# Patient Record
Sex: Female | Born: 1945 | Race: White | Hispanic: No | Marital: Married | Smoking: Never smoker
Health system: Southern US, Community
[De-identification: ages and names within clinical notes are randomized; demographics above are authoritative.]

## PROBLEM LIST (undated history)

## (undated) DIAGNOSIS — I639 Cerebral infarction, unspecified: Secondary | ICD-10-CM

## (undated) DIAGNOSIS — R42 Dizziness and giddiness: Secondary | ICD-10-CM

## (undated) DIAGNOSIS — G4730 Sleep apnea, unspecified: Secondary | ICD-10-CM

## (undated) DIAGNOSIS — R569 Unspecified convulsions: Secondary | ICD-10-CM

## (undated) DIAGNOSIS — E785 Hyperlipidemia, unspecified: Secondary | ICD-10-CM

## (undated) HISTORY — PX: CHOLECYSTECTOMY: SHX55

## (undated) HISTORY — DX: Cerebral infarction, unspecified: I63.9

## (undated) HISTORY — DX: Sleep apnea, unspecified: G47.30

## (undated) HISTORY — DX: Hyperlipidemia, unspecified: E78.5

## (undated) HISTORY — DX: Unspecified convulsions: R56.9

## (undated) HISTORY — DX: Dizziness and giddiness: R42

## (undated) HISTORY — PX: KNEE ARTHROSCOPY: SUR90

---

## 1968-05-02 DIAGNOSIS — I639 Cerebral infarction, unspecified: Secondary | ICD-10-CM

## 1968-05-02 HISTORY — DX: Cerebral infarction, unspecified: I63.9

## 2006-10-06 ENCOUNTER — Observation Stay (HOSPITAL_COMMUNITY): Admission: AD | Admit: 2006-10-06 | Discharge: 2006-10-07 | Payer: Self-pay | Admitting: Specialist

## 2006-10-06 ENCOUNTER — Other Ambulatory Visit: Payer: Self-pay | Admitting: Specialist

## 2006-10-17 ENCOUNTER — Encounter: Admission: RE | Admit: 2006-10-17 | Discharge: 2006-11-09 | Payer: Self-pay | Admitting: Specialist

## 2006-11-15 ENCOUNTER — Emergency Department (HOSPITAL_COMMUNITY): Admission: EM | Admit: 2006-11-15 | Discharge: 2006-11-15 | Payer: Self-pay | Admitting: Emergency Medicine

## 2008-12-10 ENCOUNTER — Ambulatory Visit (HOSPITAL_COMMUNITY): Admission: RE | Admit: 2008-12-10 | Discharge: 2008-12-10 | Payer: Self-pay | Admitting: Neurology

## 2009-01-07 ENCOUNTER — Ambulatory Visit: Payer: Self-pay | Admitting: Cardiology

## 2009-01-07 DIAGNOSIS — I498 Other specified cardiac arrhythmias: Secondary | ICD-10-CM

## 2009-01-07 DIAGNOSIS — R55 Syncope and collapse: Secondary | ICD-10-CM | POA: Insufficient documentation

## 2009-01-07 DIAGNOSIS — E669 Obesity, unspecified: Secondary | ICD-10-CM

## 2009-05-13 ENCOUNTER — Encounter (INDEPENDENT_AMBULATORY_CARE_PROVIDER_SITE_OTHER): Payer: Self-pay | Admitting: *Deleted

## 2010-06-01 NOTE — Letter (Signed)
Summary: Previsit letter  [facility]  [address] [place], [place] [address]   Phone: [phone]  Fax: [phone]       05/13/2009 MRN: [id number]  Tracie [name] [address] [place], [place]  [address]  Dear [name],  Welcome to the Gastroenterology Division at [facility].    You are scheduled to see a nurse for your pre-procedure visit on 06-05-09 at 9AM on the 3rd floor at [facility], [address]  We ask that you try to arrive at our office 15 minutes prior to your appointment time to allow for check-in.  Your nurse visit will consist of discussing your medical and surgical history, your immediate family medical history, and your medications.    Please bring a complete list of all your medications or, if you prefer, bring the medication bottles and we will list them.  We will need to be aware of both prescribed and over the counter drugs.  We will need to know exact dosage information as well.  If you are on blood thinners (Coumadin, Plavix, Aggrenox, Ticlid, etc.) please call our office today/prior to your appointment, as we need to consult with your physician about holding your medication.   Please be prepared to read and sign documents such as consent forms, a financial agreement, and acknowledgement forms.  If necessary, and with your consent, a friend or relative is welcome to sit-in on the nurse visit with you.  Please bring your insurance card so that we may make a copy of it.  If your insurance requires a referral to see a specialist, please bring your referral form from your primary care physician.  No co-pay is required for this nurse visit.     If you cannot keep your appointment, please call [phone] to cancel or reschedule prior to your appointment date.  This allows [place] the opportunity to schedule an appointment for another patient in need of care.    Thank you for choosing  Gastroenterology for your medical needs.  We appreciate  the opportunity to care for you.  Please visit [place] at our website  to learn more about our practice.                     Sincerely.                                                                                                                   The Gastroenterology Division

## 2010-09-14 NOTE — Op Note (Signed)
NAME:  Tracie [name], Tracie [name]                 ACCOUNT NO.:  [id number]   MEDICAL RECORD NO.:  [id number]          PATIENT TYPE:  AMB   LOCATION:  [facility]                         FACILITY:  [facility]   PHYSICIAN:  Erasmo Leventhal, M.D.DATE OF BIRTH:  09-13-1945   DATE OF PROCEDURE:  DATE OF DISCHARGE:                               OPERATIVE REPORT   PREOPERATIVE DIAGNOSES:  1. Right knee torn medial meniscus.  2. Early osteoarthritis.   POSTOPERATIVE DIAGNOSES:  1. Right knee torn medial meniscus.  2. Early osteoarthritis.   PROCEDURE:  1. Right knee arthroscopic partial medial meniscectomy.  2. Chondroplasty medial femoral condyle.  3. 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   SURGEON:  Valma Cava, M.D.   ASSISTANT:  Jodene Nam, P.A.C.   ANESTHESIA:  Local block, MAC.   ESTIMATED BLOOD LOSS:  Less than 10 mL.   DRAINS:  None.   COMPLICATIONS:  None.   TOURNIQUET TIME:  None.   DISPOSITION:  PACU, stable.   DESCRIPTION OF PROCEDURE:  The patient was counseled in the holding  area.  Correct side was identified.  IV started, antibiotics given,  block administered.  Taken to the operating room, placed in position,  MAC anesthesia, placed in thigh holder, prepped and draped in a sterile  fashion.  A standard three portal arthroscopy was performed in the  proximal medial, anteromedial and anterolateral portal.  Diagnostic  arthroscopy revealed the following findings:  Grade 3 chondromalacia  patella showed unstable.  Mechanical chondroplasty performed back to a  stable base.  Patellofemoral tracking was normal.  The ACL and PCL were  intact.  Lateral compartment inspected with normal cartilage on the  lateral meniscus.  __________ was inspected.  Grade 2 chondromalacia  medial femoral condyle __________ , radial tear posterior horn medial  meniscus.  Utilizing basket motorized shaver, a punch medial  meniscectomy performed back to a  stable base __________ contoured.  The  knee was then sequentially reinspected, no other abnormalities noted.  __________ suture.  __________ Marcaine 1.25%, 20 mL, with  epinephrine and 4 mg of morphine sulfate were injected into the knee at  the end of the case.  A sterile dressing applied to the knee, TED hose,  ice pack.  No complications.  Awakened, taken from the operating room to  the PACU in stable condition.  No complications or problems.  Patient is  doing well __________ dictation.           ______________________________  Erasmo Leventhal, M.D.     RAC/MEDQ  D:  10/06/2006  T:  10/06/2006  Job:  [id number]

## 2010-09-14 NOTE — Procedures (Signed)
REFERRING PHYSICIAN:  Rene Kocher, MD   CLINICAL HISTORY:  A 65 year old patient being evaluated for episode of  loss of consciousness.   Medications listed of carbamazepine and Zocor.   This is a 17-channel EEG recorded with the patient awake using standard  10/20 electrode placement.   Background awake rhythm consists of 7-8 Hz alpha which is of moderate  amplitude and synchronous reactive to eye-opening and closure.  The 6-7  Hz theta slowing is seen bilaterally in a diffuse and symmetric  distribution.  No paroxysmal epileptiform activity spikes or sharp waves  are seen.  Hyperventilation and photic stimulation are both  unremarkable.  Length of the recording is 22.2 minutes.  Technical  component is average with significant muscle artifacts towards the end  of the recording.  EKG tracing reveals regular sinus rhythm.   IMPRESSION:  This electroencephalogram performed during awake states is  abnormal due to presence of mild bihemispheric slowing is a nonspecific  finding seen in a variety of conditions.  No definite epileptiform  features were noted.           ______________________________  Sunny Schlein. Pearlean Brownie, MD     YNW:GNFA  D:  12/10/2008 14:52:29  T:  12/11/2008 01:21:39  Job #:  [id number]   cc:   Rene Kocher, M.D.  Fax: [phone]

## 2010-12-09 ENCOUNTER — Encounter: Payer: Self-pay | Admitting: Cardiology

## 2011-02-17 LAB — POCT I-STAT 4, (NA,K, GLUC, HGB,HCT)
HCT: 37
Hemoglobin: 12.6
Potassium: 3.8

## 2012-07-10 ENCOUNTER — Other Ambulatory Visit: Payer: Self-pay | Admitting: *Deleted

## 2012-07-17 ENCOUNTER — Telehealth: Payer: Self-pay | Admitting: Physician Assistant

## 2012-07-17 NOTE — Telephone Encounter (Signed)
Needs referral to [facility] to have bladder fixed, states that it has fallen

## 2012-07-17 NOTE — Telephone Encounter (Signed)
Needs referral to surgeon for bladder

## 2012-07-18 ENCOUNTER — Other Ambulatory Visit: Payer: Self-pay | Admitting: *Deleted

## 2012-07-18 DIAGNOSIS — R32 Unspecified urinary incontinence: Secondary | ICD-10-CM

## 2012-07-31 ENCOUNTER — Telehealth: Payer: Self-pay | Admitting: Physician Assistant

## 2012-08-01 ENCOUNTER — Telehealth: Payer: Self-pay | Admitting: Pharmacist

## 2012-08-01 ENCOUNTER — Ambulatory Visit (INDEPENDENT_AMBULATORY_CARE_PROVIDER_SITE_OTHER): Payer: 59

## 2012-08-01 ENCOUNTER — Ambulatory Visit (INDEPENDENT_AMBULATORY_CARE_PROVIDER_SITE_OTHER): Payer: 59 | Admitting: Pharmacist

## 2012-08-01 ENCOUNTER — Other Ambulatory Visit: Payer: Self-pay

## 2012-08-01 DIAGNOSIS — M899 Disorder of bone, unspecified: Secondary | ICD-10-CM

## 2012-08-01 DIAGNOSIS — Z79899 Other long term (current) drug therapy: Secondary | ICD-10-CM

## 2012-08-01 DIAGNOSIS — M949 Disorder of cartilage, unspecified: Secondary | ICD-10-CM

## 2012-08-01 DIAGNOSIS — M8580 Other specified disorders of bone density and structure, unspecified site: Secondary | ICD-10-CM

## 2012-08-01 DIAGNOSIS — E559 Vitamin D deficiency, unspecified: Secondary | ICD-10-CM

## 2012-08-01 MED ORDER — RALOXIFENE HCL 60 MG PO TABS: 60.0000 mg | ORAL_TABLET | Freq: Every day | ORAL | Status: DC

## 2012-08-01 NOTE — Progress Notes (Signed)
Subjective:     Patient ID: Tracie [name], female   DOB: 1945/09/30, 67 y.o.   MRN: [id number] [facility] Current Height:    63.5"    Max Lifetime Height:  66" Current Weight:     219lbs    Ethnicity: Not Hispanic White  HPI: Does pt already have a diagnosis of:  Osteopenia?  Yes Osteoporosis?  No  Back Pain?  No       Kyphosis?  No Prior fracture?  No Med(s) for Osteoporosis/Osteopenia:  none Med(s) previously tried for Osteoporosis/Osteopenia:  Fosamax 70mg  q week - took for less than 1 year but pt cannot recall why stopped and cannot locate reason for discontinuation in chart.                                                             PMH: Age at menopause:  67yo Hysterectomy?  No Oophorectomy?  No HRT? No Steroid Use?  No Thyroid med?  No History of cancer?  No History of digestive disorders (ie Crohn's)?  No Current or previous eating disorders?  No Last Vitamin D Result:  23 (08/2011) Last GFR Result:  65 (08/2011)     HPI   Review of Systems   FH/SH: Family history of osteoporosis?  No Parent with history of hip fracture?  No Family history of breast cancer?  Yes - mother, 3 sisters, cousin, maternal GM Exercise?  No Caffeine?  Yes - has 1 cup coffe daily, 1 glass of tea daily Smoking?  No Alcohol?  No    Calcium Assessment Calcium Intake  # of servings/day  Calcium mg  Milk (8 oz) 0.5  x  300  = 150  Yogurt (8 oz) 0.5 x  400 = 200  Cheese (1 oz) 0.5 x  200 = 100  Non dairy sources   250 mg  Ca supplement 0 = 0   Estimated calcium intake per day 700mg             Objective:   Physical Exam DEXA Results Date of Test T-Score for AP Spine L1-L4 T-Score for Total Left  Hip T-Score for Total Right Hip  08/01/2012 -1.8 -0.7 -0.1  05/07/2008 -2.4 -0.8 -0.3                 Assessment:   FRAX 10 year estimate: Total FX risk:  21%  (consider medication if >/= 20%) Hip FX risk:  2.2%  (consider medication if >/=  3%)  Assessment: Osteopenia high estimated fracture risk Vitamin D deficiency     Plan:    Recommendations: Start Evista 60mg  qd - gave #56 samples and called in rx to be profiled until needed Increase daily calcium intake through either supplementation or diet - handout given. Weight bearing exercise as able Educate on fall preventtion - counseling and educational materials provided Recheck vitamin D level today since last check was 1 year ago and pt was found to be vitamin D deficient.  Recheck DEXA:  2 years  Time spent counseling patient:  30 minutes

## 2012-08-01 NOTE — Telephone Encounter (Signed)
Started Tracie [name] on Evista this am - husband wanted to discuss possible side effects and how to take medication.   [name] repeated that he understood information and will call if any further questions.

## 2012-08-02 LAB — VITAMIN D 25 HYDROXY (VIT D DEFICIENCY, FRACTURES): Vit D, 25-Hydroxy: 23 ng/mL — ABNORMAL LOW (ref 30–89)

## 2012-08-09 ENCOUNTER — Encounter: Payer: Self-pay | Admitting: Family Medicine

## 2012-09-14 NOTE — Telephone Encounter (Signed)
error 

## 2012-12-10 ENCOUNTER — Other Ambulatory Visit (INDEPENDENT_AMBULATORY_CARE_PROVIDER_SITE_OTHER): Payer: Medicare Other

## 2012-12-10 DIAGNOSIS — Z79899 Other long term (current) drug therapy: Secondary | ICD-10-CM

## 2012-12-10 DIAGNOSIS — G40309 Generalized idiopathic epilepsy and epileptic syndromes, not intractable, without status epilepticus: Secondary | ICD-10-CM

## 2012-12-10 NOTE — Progress Notes (Signed)
PATIENT HERE TODAY FOR LABS ORDERED FROM DR LEWIT.

## 2012-12-11 LAB — CBC WITH DIFFERENTIAL/PLATELET
Basophils Absolute: 0 10*3/uL (ref 0.0–0.2)
Eosinophils Absolute: 0.1 10*3/uL (ref 0.0–0.4)
Hemoglobin: 13.7 g/dL (ref 11.1–15.9)
Immature Grans (Abs): 0 10*3/uL (ref 0.0–0.1)
Immature Granulocytes: 0 % (ref 0–2)
Lymphs: 42 % (ref 14–46)
MCH: 29.8 pg (ref 26.6–33.0)
MCHC: 34.1 g/dL (ref 31.5–35.7)
Monocytes: 8 % (ref 4–12)
Neutrophils Absolute: 2.3 10*3/uL (ref 1.4–7.0)
Neutrophils Relative %: 49 % (ref 40–74)
RDW: 13.8 % (ref 12.3–15.4)

## 2012-12-11 LAB — AST: AST: 15 IU/L (ref 0–40)

## 2012-12-11 LAB — ALT: ALT: 12 IU/L (ref 0–32)

## 2013-02-27 ENCOUNTER — Telehealth: Payer: Self-pay | Admitting: Family Medicine

## 2013-02-27 NOTE — Telephone Encounter (Signed)
Offered appt for today but couldn't come. Gave appt for tomorrow

## 2013-02-28 ENCOUNTER — Ambulatory Visit: Payer: Medicare Other | Admitting: Family Medicine

## 2013-10-27 ENCOUNTER — Emergency Department (HOSPITAL_COMMUNITY): Payer: Medicare HMO

## 2013-10-27 ENCOUNTER — Emergency Department (HOSPITAL_COMMUNITY)
Admission: EM | Admit: 2013-10-27 | Discharge: 2013-10-27 | Disposition: A | Discharge status: Home / Self Care | Payer: Medicare HMO | Attending: Emergency Medicine | Admitting: Emergency Medicine

## 2013-10-27 ENCOUNTER — Encounter (HOSPITAL_COMMUNITY): Payer: Self-pay | Admitting: Emergency Medicine

## 2013-10-27 DIAGNOSIS — Z8669 Personal history of other diseases of the nervous system and sense organs: Secondary | ICD-10-CM | POA: Insufficient documentation

## 2013-10-27 DIAGNOSIS — R062 Wheezing: Secondary | ICD-10-CM | POA: Insufficient documentation

## 2013-10-27 DIAGNOSIS — Z862 Personal history of diseases of the blood and blood-forming organs and certain disorders involving the immune mechanism: Secondary | ICD-10-CM | POA: Insufficient documentation

## 2013-10-27 DIAGNOSIS — N10 Acute tubulo-interstitial nephritis: Secondary | ICD-10-CM | POA: Insufficient documentation

## 2013-10-27 DIAGNOSIS — R0602 Shortness of breath: Secondary | ICD-10-CM | POA: Insufficient documentation

## 2013-10-27 DIAGNOSIS — G4730 Sleep apnea, unspecified: Secondary | ICD-10-CM | POA: Insufficient documentation

## 2013-10-27 DIAGNOSIS — E785 Hyperlipidemia, unspecified: Secondary | ICD-10-CM | POA: Insufficient documentation

## 2013-10-27 DIAGNOSIS — Z8673 Personal history of transient ischemic attack (TIA), and cerebral infarction without residual deficits: Secondary | ICD-10-CM | POA: Insufficient documentation

## 2013-10-27 DIAGNOSIS — M25559 Pain in unspecified hip: Secondary | ICD-10-CM | POA: Insufficient documentation

## 2013-10-27 DIAGNOSIS — Z9981 Dependence on supplemental oxygen: Secondary | ICD-10-CM | POA: Insufficient documentation

## 2013-10-27 DIAGNOSIS — Z79899 Other long term (current) drug therapy: Secondary | ICD-10-CM | POA: Insufficient documentation

## 2013-10-27 DIAGNOSIS — Z792 Long term (current) use of antibiotics: Secondary | ICD-10-CM | POA: Insufficient documentation

## 2013-10-27 DIAGNOSIS — Z8639 Personal history of other endocrine, nutritional and metabolic disease: Secondary | ICD-10-CM | POA: Insufficient documentation

## 2013-10-27 LAB — CBC WITH DIFFERENTIAL/PLATELET
Basophils Absolute: 0 10*3/uL (ref 0.0–0.1)
Basophils Relative: 1 % (ref 0–1)
EOS PCT: 0 % (ref 0–5)
Eosinophils Absolute: 0 10*3/uL (ref 0.0–0.7)
HEMATOCRIT: 40.2 % (ref 36.0–46.0)
HEMOGLOBIN: 14.3 g/dL (ref 12.0–15.0)
LYMPHS ABS: 0.8 10*3/uL (ref 0.7–4.0)
LYMPHS PCT: 13 % (ref 12–46)
MCH: 29.9 pg (ref 26.0–34.0)
MCHC: 35.6 g/dL (ref 30.0–36.0)
MCV: 84.1 fL (ref 78.0–100.0)
MONO ABS: 0.2 10*3/uL (ref 0.1–1.0)
Monocytes Relative: 4 % (ref 3–12)
Neutro Abs: 4.9 10*3/uL (ref 1.7–7.7)
Neutrophils Relative %: 83 % — ABNORMAL HIGH (ref 43–77)
Platelets: 45 10*3/uL — ABNORMAL LOW (ref 150–400)
RBC: 4.78 MIL/uL (ref 3.87–5.11)
RDW: 13.6 % (ref 11.5–15.5)
WBC: 5.9 10*3/uL (ref 4.0–10.5)

## 2013-10-27 LAB — BASIC METABOLIC PANEL
BUN: 33 mg/dL — AB (ref 6–23)
CHLORIDE: 91 meq/L — AB (ref 96–112)
CO2: 20 meq/L (ref 19–32)
CREATININE: 1.77 mg/dL — AB (ref 0.50–1.10)
Calcium: 8.4 mg/dL (ref 8.4–10.5)
GFR calc Af Amer: 33 mL/min — ABNORMAL LOW (ref >90)
GFR calc non Af Amer: 29 mL/min — ABNORMAL LOW (ref >90)
Glucose, Bld: 155 mg/dL — ABNORMAL HIGH (ref 70–99)
Potassium: 3.5 mEq/L — ABNORMAL LOW (ref 3.7–5.3)
Sodium: 130 mEq/L — ABNORMAL LOW (ref 137–147)

## 2013-10-27 LAB — URINALYSIS, ROUTINE W REFLEX MICROSCOPIC
GLUCOSE, UA: NEGATIVE mg/dL
KETONES UR: NEGATIVE mg/dL
Nitrite: NEGATIVE
PH: 5.5 (ref 5.0–8.0)
Protein, ur: 30 mg/dL — AB
Specific Gravity, Urine: 1.02 (ref 1.005–1.030)
Urobilinogen, UA: 1 mg/dL (ref 0.0–1.0)

## 2013-10-27 LAB — URINE MICROSCOPIC-ADD ON

## 2013-10-27 LAB — I-STAT CG4 LACTIC ACID, ED: LACTIC ACID, VENOUS: 3.49 mmol/L — AB (ref 0.5–2.2)

## 2013-10-27 MED ORDER — SODIUM CHLORIDE 0.9 % IV SOLN
Freq: Once | INTRAVENOUS | Status: AC
  Administered 2013-10-27: 08:00:00 via INTRAVENOUS

## 2013-10-27 MED ORDER — ACETAMINOPHEN 325 MG PO TABS
650.0000 mg | ORAL_TABLET | Freq: Once | ORAL | Status: AC
  Administered 2013-10-27: 650 mg via ORAL
  Filled 2013-10-27: qty 2

## 2013-10-27 MED ORDER — ALBUTEROL SULFATE (2.5 MG/3ML) 0.083% IN NEBU
2.5000 mg | INHALATION_SOLUTION | RESPIRATORY_TRACT | Status: DC | PRN
  Administered 2013-10-27: 2.5 mg via RESPIRATORY_TRACT
  Filled 2013-10-27: qty 3

## 2013-10-27 MED ORDER — SODIUM CHLORIDE 0.9 % IV BOLUS (SEPSIS)
1000.0000 mL | Freq: Once | INTRAVENOUS | Status: AC
  Administered 2013-10-27: 1000 mL via INTRAVENOUS

## 2013-10-27 MED ORDER — ONDANSETRON HCL 4 MG/2ML IJ SOLN
4.0000 mg | Freq: Once | INTRAMUSCULAR | Status: AC
  Administered 2013-10-27: 4 mg via INTRAVENOUS
  Filled 2013-10-27: qty 2

## 2013-10-27 MED ORDER — DEXTROSE 5 % IV SOLN
1.0000 g | Freq: Once | INTRAVENOUS | Status: AC
  Administered 2013-10-27: 1 g via INTRAVENOUS
  Filled 2013-10-27: qty 10

## 2013-10-27 MED ORDER — LEVOFLOXACIN 500 MG PO TABS: 500.0000 mg | ORAL_TABLET | Freq: Every day | ORAL | Status: AC

## 2013-10-27 NOTE — ED Notes (Signed)
Pt given cup of water 

## 2013-10-27 NOTE — ED Provider Notes (Signed)
CSN: [id number]     Arrival date & time 10/27/13  0715 History  This chart was scribed for Tracie Porter, MD by Ardelia Mems, ED Scribe. This patient was seen in room [facility]18[facility]18 and the patient's care was started at 7:35 AM.   Chief Complaint  Patient presents with  . Fever   The history is provided by the patient. No language interpreter was used.    HPI Comments: Tracie [name] is a 68 y.o. female who presents to the Emergency Department complaining of a cough, productive of yellow sputum, with associated SOB over the past 6 days. She reports an associated fever with aTmax of 101 F. ED temperature is 102 F. She reports that she also had nausea and episodes of emesis 2 and 3 days ago. She also reports that over the past few days, she has been having bilateral hip pain that is present when she sits up. She states that she saw her PCP for these symptoms 4 days ago and she was diagnosed with bronchitis. She states that she did not have a CXR. She states that she was started on Sulfa and a cough suppressant, which have not been offering significant relief. She denies back pain. She denies any history of cardiac or renal disease.    Past Medical History  Diagnosis Date  . Sleep apnea     somewhat compliant with CPAP  . CVA (cerebral infarction) 1970  . Seizures   . Gout   . Hyperlipidemia   . Vertigo    Past Surgical History  Procedure Laterality Date  . Cholecystectomy    . Knee arthroscopy      Right   Family History  Problem Relation Age of Onset  . Heart attack Father    History  Substance Use Topics  . Smoking status: Never Smoker   . Smokeless tobacco: Not on file  . Alcohol Use: No   OB History   Grav Para Term Preterm Abortions TAB SAB Ect Mult Living                 Review of Systems  Constitutional: Positive for fever. Negative for chills, diaphoresis, appetite change and fatigue.  HENT: Negative for mouth sores, sore throat and trouble swallowing.   Eyes: Negative  for visual disturbance.  Respiratory: Positive for cough and shortness of breath. Negative for chest tightness and wheezing.   Cardiovascular: Negative for chest pain.  Gastrointestinal: Positive for nausea and vomiting (resolved). Negative for abdominal pain, diarrhea and abdominal distention.  Endocrine: Negative for polydipsia, polyphagia and polyuria.  Genitourinary: Negative for dysuria, frequency and hematuria.  Musculoskeletal: Positive for arthralgias (bilateral hips). Negative for back pain and gait problem.  Skin: Negative for color change, pallor and rash.  Neurological: Negative for dizziness, syncope, light-headedness and headaches.  Hematological: Does not bruise/bleed easily.  Psychiatric/Behavioral: Negative for behavioral problems and confusion.    Allergies  Review of patient's allergies indicates no known allergies.  Home Medications   Prior to Admission medications   Medication Sig Start Date End Date Taking? Authorizing Laurance Heide  acetaminophen (TYLENOL) 500 MG tablet Take 500-1,000 mg by mouth 2 (two) times daily.   Yes Historical Valeri Sula, MD  albuterol (PROVENTIL HFA;VENTOLIN HFA) 108 (90 BASE) MCG/ACT inhaler Inhale 2 puffs into the lungs every 6 (six) hours as needed for wheezing or shortness of breath.   Yes Historical Joie Hipps, MD  HYDROcodone-homatropine (HYCODAN) 5-1.5 MG/5ML syrup Take 5 mLs by mouth at bedtime.   Yes Historical Jeston Junkins,  MD  lamoTRIgine (LAMICTAL) 100 MG tablet Take 100 mg by mouth 3 (three) times daily.  08/01/09  Yes Historical Lyn Deemer, MD  promethazine (PHENERGAN) 25 MG tablet Take 25 mg by mouth every 6 (six) hours as needed for nausea or vomiting.   Yes Historical Jeanne Diefendorf, MD  sulfamethoxazole-trimethoprim (BACTRIM DS) 800-160 MG per tablet Take 1 tablet by mouth 2 (two) times daily.   Yes Historical Loghan Kurtzman, MD  Vitamin D, Ergocalciferol, (DRISDOL) 50000 UNITS CAPS capsule Take 50,000 Units by mouth every 7 (seven) days. On Saturday.    Yes Historical Audrionna Lampton, MD  levofloxacin (LEVAQUIN) 500 MG tablet Take 1 tablet (500 mg total) by mouth daily. 10/27/13   Tracie PorterMark James, MD   Triage Vitals: BP 114/72  Pulse 97  Temp(Src) 102 F (38.9 C) (Oral)  Resp 24  Ht 5\' 6"  (1.676 m)  Wt 220 lb (99.791 kg)  BMI 35.53 kg/m2  SpO2 93%  Physical Exam  Vitals reviewed. Constitutional: She is oriented to person, place, and time. She appears well-developed and well-nourished. No distress.  3 to 4 word dyspnea  HENT:  Head: Normocephalic.  Erythematous tonsils with some white exudate. Mucous membranes are dry.  Eyes: Conjunctivae are normal. Pupils are equal, round, and reactive to light. No scleral icterus.  Neck: Normal range of motion. Neck supple. No thyromegaly present.  Cardiovascular: Normal rate and regular rhythm.  Exam reveals no gallop and no friction rub.   No murmur heard. Pulmonary/Chest: Effort normal. No respiratory distress. She has wheezes. She has no rales.  Globally diminished breath sounds. Wheezing at the right base posteriorly.  Abdominal: Soft. Bowel sounds are normal. She exhibits no distension. There is no tenderness. There is no rebound.  Musculoskeletal: Normal range of motion.  Neurological: She is alert and oriented to person, place, and time.  Skin: Skin is warm and dry. No rash noted.  Psychiatric: She has a normal mood and affect. Her behavior is normal.    ED Course  Procedures (including critical care time)  DIAGNOSTIC STUDIES: Oxygen Saturation is 93% on RA, adequate by my interpretation.    COORDINATION OF CARE: 7:41 AM- Will order a CXR and diagnostic lab work. Will also order a breathing treatment, Tylenol, Zofran and IV fluids. Pt advised of plan for treatment and pt agrees.  Labs Review Labs Reviewed  CBC WITH DIFFERENTIAL - Abnormal; Notable for the following:    Platelets 45 (*)    Neutrophils Relative % 83 (*)    All other components within normal limits  BASIC METABOLIC PANEL -  Abnormal; Notable for the following:    Sodium 130 (*)    Potassium 3.5 (*)    Chloride 91 (*)    Glucose, Bld 155 (*)    BUN 33 (*)    Creatinine, Ser 1.77 (*)    GFR calc non Af Amer 29 (*)    GFR calc Af Amer 33 (*)    All other components within normal limits  URINALYSIS, ROUTINE W REFLEX MICROSCOPIC - Abnormal; Notable for the following:    Color, Urine AMBER (*)    APPearance CLOUDY (*)    Hgb urine dipstick TRACE (*)    Bilirubin Urine SMALL (*)    Protein, ur 30 (*)    Leukocytes, UA LARGE (*)    All other components within normal limits  URINE MICROSCOPIC-ADD ON - Abnormal; Notable for the following:    Squamous Epithelial / LPF MANY (*)    Bacteria, UA MANY (*)  All other components within normal limits  I-STAT CG4 LACTIC ACID, ED - Abnormal; Notable for the following:    Lactic Acid, Venous 3.49 (*)    All other components within normal limits  URINE CULTURE    Imaging Review Dg Chest 2 View  10/27/2013   CLINICAL DATA:  Fever  EXAM: CHEST  2 VIEW  COMPARISON:  10/19/2013  FINDINGS: Cardiomediastinal silhouette is stable. No acute infiltrate or pleural effusion. No pulmonary edema. Mild infrahilar bronchitic changes without focal consolidation. Degenerative changes thoracic spine.  IMPRESSION: No acute infiltrate or pulmonary edema. Mild infrahilar bronchitic changes. Degenerative changes thoracic spine.   Electronically Signed   By: Natasha MeadLiviu  Pop M.D.   On: 10/27/2013 10:17   Dg Chest Port 1 View  10/27/2013   CLINICAL DATA:  Fever  EXAM: PORTABLE CHEST - 1 VIEW  COMPARISON:  None.  FINDINGS: Apical lordotic positioning. Patient rotated left. Normal heart size for level of inspiration. No right and no definite left pleural effusion. No pneumothorax. Low lung volumes with resultant pulmonary interstitial prominence. Right lung is clear. The left lung base is poorly evaluated secondary to overlying soft tissues.  IMPRESSION: Suboptimal evaluation of the left lung base and  pleural space secondary to overlying soft tissues. Given this factor, no explanation for pneumonia identified. PA and lateral radiographs may be helpful.   Electronically Signed   By: Jeronimo GreavesKyle  Talbot M.D.   On: 10/27/2013 09:18     EKG Interpretation None      MDM   Final diagnoses:  Acute pyelonephritis    Patient given IV fluids. Feeling better. Antiemetics, taking by mouth. Given IV Rocephin for UTI. Offered admission. She declines because my Grand  kids come to see me at home". Encouraged return if not doing well at home. Encourage by mouth fluids.  I personally performed the services described in this documentation, which was scribed in my presence. The recorded information has been reviewed and is accurate.   Tracie PorterMark James, MD 10/27/13 1328

## 2013-10-27 NOTE — Discharge Instructions (Signed)
Pyelonephritis, Adult °Pyelonephritis is a kidney infection. In general, there are 2 main types of pyelonephritis: °· Infections that come on quickly without any warning (acute pyelonephritis). °· Infections that persist for a long period of time (chronic pyelonephritis). °CAUSES  °Two main causes of pyelonephritis are: °· Bacteria traveling from the bladder to the kidney. This is a problem especially in pregnant women. The urine in the bladder can become filled with bacteria from multiple causes, including: °¨ Inflammation of the prostate gland (prostatitis). °¨ Sexual intercourse in females. °¨ Bladder infection (cystitis). °· Bacteria traveling from the bloodstream to the tissue part of the kidney. °Problems that may increase your risk of getting a kidney infection include: °· Diabetes. °· Kidney stones or bladder stones. °· Cancer. °· Catheters placed in the bladder. °· Other abnormalities of the kidney or ureter. °SYMPTOMS  °· Abdominal pain. °· Pain in the side or flank area. °· Fever. °· Chills. °· Upset stomach. °· Blood in the urine (dark urine). °· Frequent urination. °· Strong or persistent urge to urinate. °· Burning or stinging when urinating. °DIAGNOSIS  °Your caregiver may diagnose your kidney infection based on your symptoms. A urine sample may also be taken. °TREATMENT  °In general, treatment depends on how severe the infection is.  °· If the infection is mild and caught early, your caregiver may treat you with oral antibiotics and send you home. °· If the infection is more severe, the bacteria may have gotten into the bloodstream. This will require intravenous (IV) antibiotics and a hospital stay. Symptoms may include: °¨ High fever. °¨ Severe flank pain. °¨ Shaking chills. °· Even after a hospital stay, your caregiver may require you to be on oral antibiotics for a period of time. °· Other treatments may be required depending upon the cause of the infection. °HOME CARE INSTRUCTIONS  °· Take your  antibiotics as directed. Finish them even if you start to feel better. °· Make an appointment to have your urine checked to make sure the infection is gone. °· Drink enough fluids to keep your urine clear or pale yellow. °· Take medicines for the bladder if you have urgency and frequency of urination as directed by your caregiver. °SEEK IMMEDIATE MEDICAL CARE IF:  °· You have a fever or persistent symptoms for more than 2-3 days. °· You have a fever and your symptoms suddenly get worse. °· You are unable to take your antibiotics or fluids. °· You develop shaking chills. °· You experience extreme weakness or fainting. °· There is no improvement after 2 days of treatment. °MAKE SURE YOU: °· Understand these instructions. °· Will watch your condition. °· Will get help right away if you are not doing well or get worse. °Document Released: 04/18/2005 Document Revised: 10/18/2011 Document Reviewed: 09/22/2010 °ExitCare® Patient Information ©2015 [employer]. This information is not intended to replace advice given to you by your health care provider. Make sure you discuss any questions you have with your health care provider. ° °

## 2013-10-27 NOTE — ED Notes (Signed)
PT c/o cough with sob and fever x6 days. PT was started on antibiotic and cough medication on Wednesday from primary care. PT c/o productive cough with yellow sputum.

## 2013-10-27 NOTE — ED Notes (Signed)
MD at bedside. 

## 2013-10-28 ENCOUNTER — Inpatient Hospital Stay (HOSPITAL_COMMUNITY): Payer: Medicare HMO

## 2013-10-28 ENCOUNTER — Encounter (HOSPITAL_COMMUNITY): Payer: Self-pay | Admitting: Emergency Medicine

## 2013-10-28 ENCOUNTER — Emergency Department (HOSPITAL_COMMUNITY): Payer: Medicare HMO

## 2013-10-28 ENCOUNTER — Inpatient Hospital Stay (HOSPITAL_COMMUNITY)
Admission: EM | Admit: 2013-10-28 | Discharge: 2013-11-30 | DRG: 870 | Disposition: E | Payer: Medicare HMO | Attending: Internal Medicine | Admitting: Internal Medicine

## 2013-10-28 DIAGNOSIS — R6521 Severe sepsis with septic shock: Secondary | ICD-10-CM | POA: Diagnosis present

## 2013-10-28 DIAGNOSIS — E669 Obesity, unspecified: Secondary | ICD-10-CM | POA: Diagnosis present

## 2013-10-28 DIAGNOSIS — E872 Acidosis, unspecified: Secondary | ICD-10-CM | POA: Diagnosis present

## 2013-10-28 DIAGNOSIS — Z23 Encounter for immunization: Secondary | ICD-10-CM

## 2013-10-28 DIAGNOSIS — R7402 Elevation of levels of lactic acid dehydrogenase (LDH): Secondary | ICD-10-CM | POA: Diagnosis present

## 2013-10-28 DIAGNOSIS — Z9119 Patient's noncompliance with other medical treatment and regimen: Secondary | ICD-10-CM

## 2013-10-28 DIAGNOSIS — K7689 Other specified diseases of liver: Secondary | ICD-10-CM | POA: Diagnosis present

## 2013-10-28 DIAGNOSIS — G9340 Encephalopathy, unspecified: Secondary | ICD-10-CM | POA: Diagnosis present

## 2013-10-28 DIAGNOSIS — E876 Hypokalemia: Secondary | ICD-10-CM | POA: Diagnosis not present

## 2013-10-28 DIAGNOSIS — J189 Pneumonia, unspecified organism: Secondary | ICD-10-CM | POA: Diagnosis not present

## 2013-10-28 DIAGNOSIS — G4730 Sleep apnea, unspecified: Secondary | ICD-10-CM | POA: Insufficient documentation

## 2013-10-28 DIAGNOSIS — R6520 Severe sepsis without septic shock: Secondary | ICD-10-CM

## 2013-10-28 DIAGNOSIS — D65 Disseminated intravascular coagulation [defibrination syndrome]: Secondary | ICD-10-CM | POA: Diagnosis present

## 2013-10-28 DIAGNOSIS — R7309 Other abnormal glucose: Secondary | ICD-10-CM | POA: Diagnosis present

## 2013-10-28 DIAGNOSIS — W57XXXA Bitten or stung by nonvenomous insect and other nonvenomous arthropods, initial encounter: Secondary | ICD-10-CM

## 2013-10-28 DIAGNOSIS — R945 Abnormal results of liver function studies: Secondary | ICD-10-CM

## 2013-10-28 DIAGNOSIS — Z91199 Patient's noncompliance with other medical treatment and regimen due to unspecified reason: Secondary | ICD-10-CM | POA: Diagnosis not present

## 2013-10-28 DIAGNOSIS — J9600 Acute respiratory failure, unspecified whether with hypoxia or hypercapnia: Secondary | ICD-10-CM | POA: Diagnosis present

## 2013-10-28 DIAGNOSIS — A419 Sepsis, unspecified organism: Principal | ICD-10-CM | POA: Diagnosis present

## 2013-10-28 DIAGNOSIS — R34 Anuria and oliguria: Secondary | ICD-10-CM | POA: Diagnosis not present

## 2013-10-28 DIAGNOSIS — A7740 Ehrlichiosis, unspecified: Secondary | ICD-10-CM | POA: Diagnosis present

## 2013-10-28 DIAGNOSIS — E873 Alkalosis: Secondary | ICD-10-CM | POA: Diagnosis present

## 2013-10-28 DIAGNOSIS — R55 Syncope and collapse: Secondary | ICD-10-CM

## 2013-10-28 DIAGNOSIS — Z8673 Personal history of transient ischemic attack (TIA), and cerebral infarction without residual deficits: Secondary | ICD-10-CM

## 2013-10-28 DIAGNOSIS — R740 Nonspecific elevation of levels of transaminase and lactic acid dehydrogenase [LDH]: Secondary | ICD-10-CM

## 2013-10-28 DIAGNOSIS — N179 Acute kidney failure, unspecified: Secondary | ICD-10-CM | POA: Diagnosis present

## 2013-10-28 DIAGNOSIS — Z113 Encounter for screening for infections with a predominantly sexual mode of transmission: Secondary | ICD-10-CM

## 2013-10-28 DIAGNOSIS — I498 Other specified cardiac arrhythmias: Secondary | ICD-10-CM | POA: Diagnosis not present

## 2013-10-28 DIAGNOSIS — R0602 Shortness of breath: Secondary | ICD-10-CM | POA: Diagnosis present

## 2013-10-28 DIAGNOSIS — R509 Fever, unspecified: Secondary | ICD-10-CM

## 2013-10-28 DIAGNOSIS — E2749 Other adrenocortical insufficiency: Secondary | ICD-10-CM | POA: Diagnosis not present

## 2013-10-28 DIAGNOSIS — K1370 Unspecified lesions of oral mucosa: Secondary | ICD-10-CM | POA: Diagnosis present

## 2013-10-28 DIAGNOSIS — Z79899 Other long term (current) drug therapy: Secondary | ICD-10-CM

## 2013-10-28 DIAGNOSIS — N171 Acute kidney failure with acute cortical necrosis: Secondary | ICD-10-CM

## 2013-10-28 DIAGNOSIS — N170 Acute kidney failure with tubular necrosis: Secondary | ICD-10-CM

## 2013-10-28 DIAGNOSIS — E861 Hypovolemia: Secondary | ICD-10-CM | POA: Diagnosis not present

## 2013-10-28 DIAGNOSIS — D696 Thrombocytopenia, unspecified: Secondary | ICD-10-CM | POA: Diagnosis present

## 2013-10-28 DIAGNOSIS — Z6837 Body mass index (BMI) 37.0-37.9, adult: Secondary | ICD-10-CM

## 2013-10-28 DIAGNOSIS — J9601 Acute respiratory failure with hypoxia: Secondary | ICD-10-CM

## 2013-10-28 DIAGNOSIS — G4733 Obstructive sleep apnea (adult) (pediatric): Secondary | ICD-10-CM | POA: Diagnosis present

## 2013-10-28 DIAGNOSIS — E8779 Other fluid overload: Secondary | ICD-10-CM | POA: Diagnosis not present

## 2013-10-28 DIAGNOSIS — A799 Rickettsiosis, unspecified: Secondary | ICD-10-CM

## 2013-10-28 DIAGNOSIS — R21 Rash and other nonspecific skin eruption: Secondary | ICD-10-CM | POA: Diagnosis not present

## 2013-10-28 DIAGNOSIS — D731 Hypersplenism: Secondary | ICD-10-CM | POA: Diagnosis present

## 2013-10-28 DIAGNOSIS — R569 Unspecified convulsions: Secondary | ICD-10-CM | POA: Diagnosis present

## 2013-10-28 DIAGNOSIS — R7401 Elevation of levels of liver transaminase levels: Secondary | ICD-10-CM | POA: Diagnosis present

## 2013-10-28 DIAGNOSIS — I639 Cerebral infarction, unspecified: Secondary | ICD-10-CM | POA: Insufficient documentation

## 2013-10-28 DIAGNOSIS — R1310 Dysphagia, unspecified: Secondary | ICD-10-CM | POA: Diagnosis not present

## 2013-10-28 DIAGNOSIS — E785 Hyperlipidemia, unspecified: Secondary | ICD-10-CM | POA: Diagnosis present

## 2013-10-28 DIAGNOSIS — R7989 Other specified abnormal findings of blood chemistry: Secondary | ICD-10-CM | POA: Diagnosis present

## 2013-10-28 DIAGNOSIS — R197 Diarrhea, unspecified: Secondary | ICD-10-CM

## 2013-10-28 DIAGNOSIS — I469 Cardiac arrest, cause unspecified: Secondary | ICD-10-CM

## 2013-10-28 LAB — MRSA PCR SCREENING: MRSA BY PCR: NEGATIVE

## 2013-10-28 LAB — CBC WITH DIFFERENTIAL/PLATELET
BASOS PCT: 0 % (ref 0–1)
Basophils Absolute: 0 10*3/uL (ref 0.0–0.1)
EOS ABS: 0 10*3/uL (ref 0.0–0.7)
EOS PCT: 0 % (ref 0–5)
HCT: 38.8 % (ref 36.0–46.0)
Hemoglobin: 13.6 g/dL (ref 12.0–15.0)
Lymphocytes Relative: 10 % — ABNORMAL LOW (ref 12–46)
Lymphs Abs: 0.7 10*3/uL (ref 0.7–4.0)
MCH: 29.8 pg (ref 26.0–34.0)
MCHC: 35.1 g/dL (ref 30.0–36.0)
MCV: 84.9 fL (ref 78.0–100.0)
Monocytes Absolute: 0.2 10*3/uL (ref 0.1–1.0)
Monocytes Relative: 3 % (ref 3–12)
Neutro Abs: 6.2 10*3/uL (ref 1.7–7.7)
Neutrophils Relative %: 87 % — ABNORMAL HIGH (ref 43–77)
Platelets: 52 10*3/uL — ABNORMAL LOW (ref 150–400)
RBC: 4.57 MIL/uL (ref 3.87–5.11)
RDW: 14.1 % (ref 11.5–15.5)
WBC: 7.1 10*3/uL (ref 4.0–10.5)

## 2013-10-28 LAB — URINE MICROSCOPIC-ADD ON

## 2013-10-28 LAB — LACTIC ACID, PLASMA
LACTIC ACID, VENOUS: 5.9 mmol/L — AB (ref 0.5–2.2)
Lactic Acid, Venous: 5.1 mmol/L — ABNORMAL HIGH (ref 0.5–2.2)

## 2013-10-28 LAB — GLUCOSE, CAPILLARY: GLUCOSE-CAPILLARY: 85 mg/dL (ref 70–99)

## 2013-10-28 LAB — COMPREHENSIVE METABOLIC PANEL
ALBUMIN: 2.9 g/dL — AB (ref 3.5–5.2)
ALT: 116 U/L — AB (ref 0–35)
AST: 238 U/L — AB (ref 0–37)
Alkaline Phosphatase: 88 U/L (ref 39–117)
BUN: 34 mg/dL — ABNORMAL HIGH (ref 6–23)
CO2: 20 mEq/L (ref 19–32)
Calcium: 7.6 mg/dL — ABNORMAL LOW (ref 8.4–10.5)
Chloride: 97 mEq/L (ref 96–112)
Creatinine, Ser: 1.48 mg/dL — ABNORMAL HIGH (ref 0.50–1.10)
GFR calc Af Amer: 41 mL/min — ABNORMAL LOW (ref >90)
GFR calc non Af Amer: 36 mL/min — ABNORMAL LOW (ref >90)
Glucose, Bld: 109 mg/dL — ABNORMAL HIGH (ref 70–99)
Potassium: 3.7 mEq/L (ref 3.7–5.3)
SODIUM: 135 meq/L — AB (ref 137–147)
TOTAL PROTEIN: 6.5 g/dL (ref 6.0–8.3)
Total Bilirubin: 1.4 mg/dL — ABNORMAL HIGH (ref 0.3–1.2)

## 2013-10-28 LAB — URINALYSIS, ROUTINE W REFLEX MICROSCOPIC
GLUCOSE, UA: NEGATIVE mg/dL
Glucose, UA: NEGATIVE mg/dL
Hgb urine dipstick: NEGATIVE
Ketones, ur: 15 mg/dL — AB
LEUKOCYTES UA: NEGATIVE
NITRITE: NEGATIVE
Nitrite: NEGATIVE
Protein, ur: NEGATIVE mg/dL
Protein, ur: 30 mg/dL — AB
SPECIFIC GRAVITY, URINE: 1.03 (ref 1.005–1.030)
Specific Gravity, Urine: 1.015 (ref 1.005–1.030)
Urobilinogen, UA: 1 mg/dL (ref 0.0–1.0)
Urobilinogen, UA: 2 mg/dL — ABNORMAL HIGH (ref 0.0–1.0)
pH: 5.5 (ref 5.0–8.0)
pH: 5 (ref 5.0–8.0)

## 2013-10-28 LAB — POCT I-STAT 3, ART BLOOD GAS (G3+)
Acid-base deficit: 11 mmol/L — ABNORMAL HIGH (ref 0.0–2.0)
Bicarbonate: 16.1 mEq/L — ABNORMAL LOW (ref 20.0–24.0)
O2 Saturation: 90 %
PCO2 ART: 44.1 mmHg (ref 35.0–45.0)
PO2 ART: 82 mmHg (ref 80.0–100.0)
Patient temperature: 102.7
TCO2: 17 mmol/L (ref 0–100)
pH, Arterial: 7.182 — CL (ref 7.350–7.450)

## 2013-10-28 LAB — DIC (DISSEMINATED INTRAVASCULAR COAGULATION) PANEL
FIBRINOGEN: 184 mg/dL — AB (ref 204–475)
PLATELETS: 52 10*3/uL — AB (ref 150–400)
PROTHROMBIN TIME: 16.6 s — AB (ref 11.6–15.2)

## 2013-10-28 LAB — LIPASE, BLOOD: Lipase: 47 U/L (ref 11–59)

## 2013-10-28 LAB — BLOOD GAS, ARTERIAL
Acid-base deficit: 8.3 mmol/L — ABNORMAL HIGH (ref 0.0–2.0)
Bicarbonate: 15.4 mEq/L — ABNORMAL LOW (ref 20.0–24.0)
Drawn by: 234301
O2 Content: 1 L/min
O2 Saturation: 93.5 %
PCO2 ART: 24.3 mmHg — AB (ref 35.0–45.0)
PH ART: 7.418 (ref 7.350–7.450)
PO2 ART: 65.6 mmHg — AB (ref 80.0–100.0)
Patient temperature: 37
TCO2: 13.7 mmol/L (ref 0–100)

## 2013-10-28 LAB — DIC (DISSEMINATED INTRAVASCULAR COAGULATION)PANEL
INR: 1.34 (ref 0.00–1.49)
Smear Review: NONE SEEN
aPTT: 43 seconds — ABNORMAL HIGH (ref 24–37)

## 2013-10-28 LAB — APTT: aPTT: 48 seconds — ABNORMAL HIGH (ref 24–37)

## 2013-10-28 LAB — PROTIME-INR
INR: 1.49 (ref 0.00–1.49)
Prothrombin Time: 18 seconds — ABNORMAL HIGH (ref 11.6–15.2)

## 2013-10-28 LAB — PROCALCITONIN: PROCALCITONIN: 20 ng/mL

## 2013-10-28 LAB — PRO B NATRIURETIC PEPTIDE: Pro B Natriuretic peptide (BNP): 2537 pg/mL — ABNORMAL HIGH (ref 0–125)

## 2013-10-28 LAB — TROPONIN I

## 2013-10-28 MED ORDER — ROCURONIUM BROMIDE 50 MG/5ML IV SOLN
50.0000 mg | Freq: Once | INTRAVENOUS | Status: AC
  Administered 2013-10-28: 50 mg via INTRAVENOUS

## 2013-10-28 MED ORDER — SODIUM CHLORIDE 0.9 % IV SOLN
INTRAVENOUS | Status: DC
Start: 2013-10-28 — End: 2013-10-28

## 2013-10-28 MED ORDER — ACETAMINOPHEN 500 MG PO TABS
1000.0000 mg | ORAL_TABLET | Freq: Once | ORAL | Status: AC
  Administered 2013-10-28: 1000 mg via ORAL

## 2013-10-28 MED ORDER — SODIUM CHLORIDE 0.9 % IV BOLUS (SEPSIS)
1000.0000 mL | Freq: Once | INTRAVENOUS | Status: AC
  Administered 2013-10-28: 1000 mL via INTRAVENOUS

## 2013-10-28 MED ORDER — SODIUM CHLORIDE 0.9 % IV SOLN
250.0000 mL | INTRAVENOUS | Status: DC | PRN
  Administered 2013-10-30: 1000 mL via INTRAVENOUS

## 2013-10-28 MED ORDER — SODIUM CHLORIDE 0.9 % IV SOLN: INTRAVENOUS | Status: DC

## 2013-10-28 MED ORDER — FENTANYL CITRATE 0.05 MG/ML IJ SOLN
INTRAMUSCULAR | Status: AC
  Administered 2013-10-28: 300 ug
  Filled 2013-10-28: qty 2

## 2013-10-28 MED ORDER — SODIUM CHLORIDE 0.9 % IV SOLN
10.0000 ug/h | INTRAVENOUS | Status: DC
  Administered 2013-10-29: 200 ug/h via INTRAVENOUS
  Administered 2013-10-29: 100 ug/h via INTRAVENOUS
  Administered 2013-10-30 – 2013-11-01 (×7): 300 ug/h via INTRAVENOUS
  Filled 2013-10-28 (×12): qty 50

## 2013-10-28 MED ORDER — PIPERACILLIN-TAZOBACTAM 3.375 G IVPB
3.3750 g | Freq: Three times a day (TID) | INTRAVENOUS | Status: DC
  Administered 2013-10-28 – 2013-10-30 (×5): 3.375 g via INTRAVENOUS
  Filled 2013-10-28 (×7): qty 50

## 2013-10-28 MED ORDER — MIDAZOLAM HCL 2 MG/2ML IJ SOLN
INTRAMUSCULAR | Status: AC
  Administered 2013-10-28: 6 mg
  Filled 2013-10-28: qty 4

## 2013-10-28 MED ORDER — CHLORHEXIDINE GLUCONATE 0.12 % MT SOLN
15.0000 mL | Freq: Two times a day (BID) | OROMUCOSAL | Status: DC
  Administered 2013-10-29 (×2): 15 mL via OROMUCOSAL
  Filled 2013-10-28 (×2): qty 15

## 2013-10-28 MED ORDER — FENTANYL CITRATE 0.05 MG/ML IJ SOLN
50.0000 ug | INTRAMUSCULAR | Status: DC | PRN
Start: 2013-10-28 — End: 2013-11-04

## 2013-10-28 MED ORDER — VANCOMYCIN HCL IN DEXTROSE 750-5 MG/150ML-% IV SOLN
750.0000 mg | Freq: Two times a day (BID) | INTRAVENOUS | Status: DC
  Administered 2013-10-29 (×2): 750 mg via INTRAVENOUS
  Filled 2013-10-28 (×3): qty 150

## 2013-10-28 MED ORDER — SODIUM CHLORIDE 0.9 % IV SOLN
INTRAVENOUS | Status: DC
  Administered 2013-10-28 – 2013-10-30 (×4): via INTRAVENOUS

## 2013-10-28 MED ORDER — VANCOMYCIN HCL IN DEXTROSE 1-5 GM/200ML-% IV SOLN
1000.0000 mg | Freq: Once | INTRAVENOUS | Status: AC
  Administered 2013-10-28: 1000 mg via INTRAVENOUS
  Filled 2013-10-28: qty 200

## 2013-10-28 MED ORDER — IPRATROPIUM-ALBUTEROL 0.5-2.5 (3) MG/3ML IN SOLN
3.0000 mL | Freq: Once | RESPIRATORY_TRACT | Status: AC
  Administered 2013-10-28: 3 mL via RESPIRATORY_TRACT
  Filled 2013-10-28: qty 3

## 2013-10-28 MED ORDER — MIDAZOLAM HCL 2 MG/2ML IJ SOLN
INTRAMUSCULAR | Status: AC
  Filled 2013-10-28: qty 2

## 2013-10-28 MED ORDER — PIPERACILLIN-TAZOBACTAM 3.375 G IVPB 30 MIN
3.3750 g | Freq: Once | INTRAVENOUS | Status: AC
  Administered 2013-10-28: 3.375 g via INTRAVENOUS
  Filled 2013-10-28: qty 50

## 2013-10-28 MED ORDER — FENTANYL CITRATE 0.05 MG/ML IJ SOLN: 50.0000 ug | INTRAMUSCULAR | Status: DC | PRN

## 2013-10-28 MED ORDER — BIOTENE DRY MOUTH MT LIQD
15.0000 mL | Freq: Four times a day (QID) | OROMUCOSAL | Status: DC
  Administered 2013-10-29 (×4): 15 mL via OROMUCOSAL

## 2013-10-28 MED ORDER — HYDROCORTISONE NA SUCCINATE PF 100 MG IJ SOLR
50.0000 mg | Freq: Four times a day (QID) | INTRAMUSCULAR | Status: DC
  Administered 2013-10-29 (×3): 50 mg via INTRAVENOUS
  Filled 2013-10-28 (×7): qty 1

## 2013-10-28 MED ORDER — ACETAMINOPHEN 650 MG RE SUPP: 650.0000 mg | Freq: Once | RECTAL | Status: DC

## 2013-10-28 MED ORDER — ACETAMINOPHEN 500 MG PO TABS
ORAL_TABLET | ORAL | Status: AC
  Administered 2013-10-28: 1000 mg via ORAL
  Filled 2013-10-28: qty 2

## 2013-10-28 MED ORDER — PANTOPRAZOLE SODIUM 40 MG IV SOLR
40.0000 mg | INTRAVENOUS | Status: DC
  Administered 2013-10-29 – 2013-11-02 (×6): 40 mg via INTRAVENOUS
  Filled 2013-10-28 (×8): qty 40

## 2013-10-28 MED ORDER — ETOMIDATE 2 MG/ML IV SOLN
20.0000 mg | Freq: Once | INTRAVENOUS | Status: AC
  Administered 2013-10-28: 20 mg via INTRAVENOUS

## 2013-10-28 MED ORDER — ONDANSETRON HCL 4 MG/2ML IJ SOLN
4.0000 mg | Freq: Once | INTRAMUSCULAR | Status: AC
Start: 2013-10-28 — End: 2013-10-28
  Administered 2013-10-28: 4 mg via INTRAVENOUS
  Filled 2013-10-28: qty 2

## 2013-10-28 MED ORDER — VANCOMYCIN HCL IN DEXTROSE 1-5 GM/200ML-% IV SOLN
1000.0000 mg | INTRAVENOUS | Status: AC
  Administered 2013-10-28: 1000 mg via INTRAVENOUS
  Filled 2013-10-28: qty 200

## 2013-10-28 MED ORDER — FENTANYL CITRATE 0.05 MG/ML IJ SOLN
INTRAMUSCULAR | Status: AC
  Filled 2013-10-28: qty 4

## 2013-10-28 NOTE — Progress Notes (Signed)
eLink Physician-Brief Progress Note Patient Name: Tracie JasperBrenda B [name] DOB: 08/01/1945 MRN: [id number]  Date of Service  2013/09/14   HPI/Events of Note   Admit with ams/ fever 102.8 ? Etiology/ abn lfts and bipap dep resp failure with  thrombocytopenia c/w sepsis but not def source  eICU Interventions  Monitor fever curve on abx, monitor sats on bibap   Intervention Category Major Interventions: Sepsis - evaluation and management  Sandrea HughsMichael Aleksa Collinsworth 2013/09/14, 8:46 PM

## 2013-10-28 NOTE — Procedures (Signed)
Intubation Procedure Note Tracie [name] [id number] 01/19/1946  Procedure: Intubation Indications: Respiratory insufficiency  Procedure Details Consent: Risks of procedure as well as the alternatives and risks of each were explained to the (patient/caregiver).  Consent for procedure obtained. Time Out: Verified patient identification, verified procedure, site/side was marked, verified correct patient position, special equipment/implants available, medications/allergies/relevent history reviewed, required imaging and test results available.  Performed  Maximum sterile technique was used including gloves, hand hygiene and mask.  MAC and 3 , glidescope    Evaluation Hemodynamic Status: BP stable throughout; O2 sats: stable throughout Patient's Current Condition: stable Complications: No apparent complications Patient did tolerate procedure well. Chest X-ray ordered to verify placement.  CXR: pending.   Procedure performed by Mikey BussingHoffman NP under my direct supervision  ALVA,RAKESH V. 10/23/2013

## 2013-10-28 NOTE — ED Notes (Signed)
MD at bedside. 

## 2013-10-28 NOTE — ED Notes (Addendum)
While giving report, hospitalist reports pt is being transferred to [facility] and not going to [facility].

## 2013-10-28 NOTE — ED Notes (Addendum)
Pt respirations increasing and breathing becoming more labored. EDP aware of pt breathing pattern and v/s. Pt give order for PO 1000mg  of tylenol and to obtain blood gas prior to breathing treatment or bipap being ordered.RT aware of blood gas order.

## 2013-10-28 NOTE — Progress Notes (Signed)
ANTIBIOTIC CONSULT NOTE   Pharmacy Consult for Vancomycin and Zosyn Indication: rule out sepsis  No Known Allergies  Patient Measurements: Height: 5\' 8"  (172.7 cm) Weight: 222 lb 3.6 oz (100.8 kg) IBW/kg (Calculated) : 63.9  Vital Signs: Temp: 105 F (40.6 C) (06/29 1825) Temp src: Other (Comment) (06/29 1627) BP: 121/59 mmHg (06/29 1825) Pulse Rate: 102 (06/29 1825) Intake/Output from previous day:   Intake/Output from this shift: Total I/O In: 116 [P.O.:16; I.V.:100] Out: 275 [Urine:275]  Labs:  Recent Labs  10/27/13 0748 May 19, 2013 1130 May 19, 2013 1140  WBC 5.9  --  7.1  HGB 14.3  --  13.6  PLT 45* 52* 52*  CREATININE 1.77*  --  1.48*   Estimated Creatinine Clearance: 46.5 ml/min (by C-G formula based on Cr of 1.48). No results found for this basename: VANCOTROUGH, VANCOPEAK, VANCORANDOM, GENTTROUGH, GENTPEAK, GENTRANDOM, TOBRATROUGH, TOBRAPEAK, TOBRARND, AMIKACINPEAK, AMIKACINTROU, AMIKACIN,  in the last 72 hours   Microbiology: No results found for this or any previous visit (from the past 720 hour(s)).  Medical History: Past Medical History  Diagnosis Date  . Sleep apnea     somewhat compliant with CPAP  . CVA (cerebral infarction) 1970  . Seizures   . Gout   . Hyperlipidemia   . Vertigo    Assessment: 68 year old who presents with cough, weakness, and confusion. Fever of 103 in the ED, low bp, wbc count normal. Patient was given first doses of vancomycin and zosyn in the ED, will continue abx in the ICU. Renal function shows some improvement from this morning, scr 1.77>>1.48. Lactic acid 5.1.   Goal of Therapy:  Vancomycin trough level 15-20 mcg/ml  Plan:  Measure antibiotic drug levels at steady state Follow up culture results Vancomycin 1g IV now for a total of 2g load then 750 q12h Zosyn 3.375g IV q8 hours - infuse each dose over 4 hours  Sheppard CoilFrank Wilson PharmD., BCPS Clinical Pharmacist Pager [phone] 02-13-2014 6:48 PM

## 2013-10-28 NOTE — ED Notes (Signed)
Was here yesterday in ER.  Still SOB, running fever, not eating.  Has blisters in mouth.

## 2013-10-28 NOTE — ED Notes (Signed)
Per EDP to insert second peripheral IV. Charge RN attempted with no success. Other ED RN will attempt. Hospitalist at bedside.

## 2013-10-28 NOTE — ED Provider Notes (Addendum)
CSN: [id number]     Arrival date & time 10/20/2013  1054 History  This chart was scribed for Vanetta MuldersScott Zackowski, MD by Ardelia Memsylan Malpass, ED Scribe. This patient was seen in room APA06/APA06 and the patient's care was started at 11:22 AM.   Chief Complaint  Patient presents with  . Shortness of Breath    Patient is a 68 y.o. female presenting with shortness of breath. The history is provided by the patient. The history is limited by the condition of the patient. No language interpreter was used.  Shortness of Breath Severity:  Severe Onset quality:  Gradual Duration:  5 days Timing:  Constant Progression:  Worsening Chronicity:  New Relieved by:  Nothing Worsened by:  Nothing tried Associated symptoms: cough, fever, headaches and vomiting   Associated symptoms: no rash    LEVEL 5 CAVEAT (Respiratory Distress)  HPI Comments: Particia JasperBrenda B Cortright is a 68 y.o. female who presents to the Emergency Department complaining of SOB with an associated cough over the past 5 days. Pt is accompanied by a family member who is assisting in providing history. Family member reports associated fever, mouth sores and reduced appetite. ED temperature is 100.4 F. Pt was seen here in the ED yesterday for similar symptoms. She was offered admission to the hospital and she declined this option yesterday. She was started on Levofloxacin after her visit yesterday. Upon arrival to the ED today, her oxygen saturation was 95%. She was placed on supplemental O2 in the ED. She denies any history of asthma, COPD or other pulmonary disease. She denies using supplemental O2 at home. She has been seen by her PCP for these symptoms earlier in the week and she was diagnosed with bronchitis. She has been given Sulfa antibiotics and a cough suppressant for this. She states that she had episodes of both diarrhea and emesis several days ago, which started after the SOB/cough/fever, and have since resolved.   PCP- [place] Rud[place]   Past Medical History  Diagnosis Date  . Sleep apnea     somewhat compliant with CPAP  . CVA (cerebral infarction) 1970  . Seizures   . Gout   . Hyperlipidemia   . Vertigo    Past Surgical History  Procedure Laterality Date  . Cholecystectomy    . Knee arthroscopy      Right   Family History  Problem Relation Age of Onset  . Heart attack Father    History  Substance Use Topics  . Smoking status: Never Smoker   . Smokeless tobacco: Not on file  . Alcohol Use: No   OB History   Grav Para Term Preterm Abortions TAB SAB Ect Mult Living                 Review of Systems  Constitutional: Positive for fever and appetite change.  HENT: Positive for mouth sores.   Respiratory: Positive for cough and shortness of breath.   Gastrointestinal: Positive for nausea, vomiting and diarrhea.  Skin: Negative for rash.  Neurological: Positive for headaches.    Allergies  Review of patient's allergies indicates no known allergies.  Home Medications   Prior to Admission medications   Medication Sig Start Date End Date Taking? Authorizing Provider  acetaminophen (TYLENOL) 500 MG tablet Take 500-1,000 mg by mouth 2 (two) times daily.   Yes Historical Provider, MD  albuterol (PROVENTIL HFA;VENTOLIN HFA) 108 (90 BASE) MCG/ACT inhaler Inhale 2 puffs into the lungs every 6 (six) hours  as needed for wheezing or shortness of breath.   Yes Historical Provider, MD  HYDROcodone-homatropine (HYCODAN) 5-1.5 MG/5ML syrup Take 5 mLs by mouth at bedtime.   Yes Historical Provider, MD  lamoTRIgine (LAMICTAL) 100 MG tablet Take 100 mg by mouth 3 (three) times daily.  08/01/09  Yes Historical Provider, MD  levofloxacin (LEVAQUIN) 500 MG tablet Take 1 tablet (500 mg total) by mouth daily. 10/27/13  Yes [name], MD  promethazine (PHENERGAN) 25 MG tablet Take 25 mg by mouth every 6 (six) hours as needed for nausea or vomiting.   Yes Historical Provider, MD  sulfamethoxazole-trimethoprim  (BACTRIM DS) 800-160 MG per tablet Take 1 tablet by mouth 2 (two) times daily.   Yes Historical Provider, MD  Vitamin D, Ergocalciferol, (DRISDOL) 50000 UNITS CAPS capsule Take 50,000 Units by mouth every 7 (seven) days. On Saturday.   Yes Historical Provider, MD   Triage Vitals: BP 104/60  Pulse 97  Temp(Src) 100.4 F (38 C) (Oral)  Ht 5\' 6"  (1.676 m)  SpO2 97%  Physical Exam  Nursing note and vitals reviewed. Constitutional: She is oriented to person, place, and time. She appears well-developed and well-nourished. No distress.  HENT:  Head: Normocephalic and atraumatic.  Mouth is very dry  Eyes: Conjunctivae and EOM are normal.  Neck: Neck supple. No tracheal deviation present.  Cardiovascular:  No murmur heard. Tachycardic, but no murmurs  Pulmonary/Chest: She has wheezes.  Decreased breath sounds on both sides. Occasional faint wheezing.  Musculoskeletal: Normal range of motion. She exhibits no edema.  No swelling in ankles  Neurological: She is alert and oriented to person, place, and time.  Skin: Skin is warm and dry.  Psychiatric: She has a normal mood and affect. Her behavior is normal.    ED Course  Procedures (including critical care time)  DIAGNOSTIC STUDIES: Oxygen Saturation is 97% on RA, normal by my interpretation.    COORDINATION OF CARE: 11:33 AM- Will order a CXR and diagnostic lab work. Will also order IV fluids and Zofran. Pt advised of plan for treatment and pt agrees.  Labs Review Labs Reviewed  CBC WITH DIFFERENTIAL - Abnormal; Notable for the following:    Platelets 52 (*)    Neutrophils Relative % 87 (*)    Lymphocytes Relative 10 (*)    All other components within normal limits  COMPREHENSIVE METABOLIC PANEL - Abnormal; Notable for the following:    Sodium 135 (*)    Glucose, Bld 109 (*)    BUN 34 (*)    Creatinine, Ser 1.48 (*)    Calcium 7.6 (*)    Albumin 2.9 (*)    AST 238 (*)    ALT 116 (*)    Total Bilirubin 1.4 (*)    GFR calc  non Af Amer 36 (*)    GFR calc Af Amer 41 (*)    All other components within normal limits  PRO B NATRIURETIC PEPTIDE - Abnormal; Notable for the following:    Pro B Natriuretic peptide (BNP) 2537.0 (*)    All other components within normal limits  URINALYSIS, ROUTINE W REFLEX MICROSCOPIC - Abnormal; Notable for the following:    Color, Urine AMBER (*)    APPearance CLOUDY (*)    Bilirubin Urine SMALL (*)    Ketones, ur TRACE (*)    All other components within normal limits  LACTIC ACID, PLASMA - Abnormal; Notable for the following:    Lactic Acid, Venous 5.1 (*)    All other  components within normal limits  BLOOD GAS, ARTERIAL - Abnormal; Notable for the following:    pCO2 arterial 24.3 (*)    pO2, Arterial 65.6 (*)    Bicarbonate 15.4 (*)    Acid-base deficit 8.3 (*)    All other components within normal limits  URINE CULTURE  CULTURE, BLOOD (ROUTINE X 2)  CULTURE, BLOOD (ROUTINE X 2)  LIPASE, BLOOD  TROPONIN I   Results for orders placed during the hospital encounter of 10/31/2013  CBC WITH DIFFERENTIAL      Result Value Ref Range   WBC 7.1  4.0 - 10.5 K/uL   RBC 4.57  3.87 - 5.11 MIL/uL   Hemoglobin 13.6  12.0 - 15.0 g/dL   HCT 16.1  09.6 - 04.5 %   MCV 84.9  78.0 - 100.0 fL   MCH 29.8  26.0 - 34.0 pg   MCHC 35.1  30.0 - 36.0 g/dL   RDW 40.9  81.1 - 91.4 %   Platelets 52 (*) 150 - 400 K/uL   Neutrophils Relative % 87 (*) 43 - 77 %   Neutro Abs 6.2  1.7 - 7.7 K/uL   Lymphocytes Relative 10 (*) 12 - 46 %   Lymphs Abs 0.7  0.7 - 4.0 K/uL   Monocytes Relative 3  3 - 12 %   Monocytes Absolute 0.2  0.1 - 1.0 K/uL   Eosinophils Relative 0  0 - 5 %   Eosinophils Absolute 0.0  0.0 - 0.7 K/uL   Basophils Relative 0  0 - 1 %   Basophils Absolute 0.0  0.0 - 0.1 K/uL  COMPREHENSIVE METABOLIC PANEL      Result Value Ref Range   Sodium 135 (*) 137 - 147 mEq/L   Potassium 3.7  3.7 - 5.3 mEq/L   Chloride 97  96 - 112 mEq/L   CO2 20  19 - 32 mEq/L   Glucose, Bld 109 (*) 70 - 99  mg/dL   BUN 34 (*) 6 - 23 mg/dL   Creatinine, Ser 7.82 (*) 0.50 - 1.10 mg/dL   Calcium 7.6 (*) 8.4 - 10.5 mg/dL   Total Protein 6.5  6.0 - 8.3 g/dL   Albumin 2.9 (*) 3.5 - 5.2 g/dL   AST 956 (*) 0 - 37 U/L   ALT 116 (*) 0 - 35 U/L   Alkaline Phosphatase 88  39 - 117 U/L   Total Bilirubin 1.4 (*) 0.3 - 1.2 mg/dL   GFR calc non Af Amer 36 (*) >90 mL/min   GFR calc Af Amer 41 (*) >90 mL/min  LIPASE, BLOOD      Result Value Ref Range   Lipase 47  11 - 59 U/L  PRO B NATRIURETIC PEPTIDE      Result Value Ref Range   Pro B Natriuretic peptide (BNP) 2537.0 (*) 0 - 125 pg/mL  URINALYSIS, ROUTINE W REFLEX MICROSCOPIC      Result Value Ref Range   Color, Urine AMBER (*) YELLOW   APPearance CLOUDY (*) CLEAR   Specific Gravity, Urine 1.015  1.005 - 1.030   pH 5.5  5.0 - 8.0   Glucose, UA NEGATIVE  NEGATIVE mg/dL   Hgb urine dipstick NEGATIVE  NEGATIVE   Bilirubin Urine SMALL (*) NEGATIVE   Ketones, ur TRACE (*) NEGATIVE mg/dL   Protein, ur NEGATIVE  NEGATIVE mg/dL   Urobilinogen, UA 1.0  0.0 - 1.0 mg/dL   Nitrite NEGATIVE  NEGATIVE   Leukocytes, UA NEGATIVE  NEGATIVE  LACTIC ACID, PLASMA      Result Value Ref Range   Lactic Acid, Venous 5.1 (*) 0.5 - 2.2 mmol/L  BLOOD GAS, ARTERIAL      Result Value Ref Range   O2 Content 1.0     Delivery systems NASAL CANNULA     pH, Arterial 7.418  7.350 - 7.450   pCO2 arterial 24.3 (*) 35.0 - 45.0 mmHg   pO2, Arterial 65.6 (*) 80.0 - 100.0 mmHg   Bicarbonate 15.4 (*) 20.0 - 24.0 mEq/L   TCO2 13.7  0 - 100 mmol/L   Acid-base deficit 8.3 (*) 0.0 - 2.0 mmol/L   O2 Saturation 93.5     Patient temperature 37.0     Collection site RIGHT BRACHIAL     Drawn by [id number]     Sample type ARTERIAL     Allens test (pass/fail) PASS  PASS  TROPONIN I      Result Value Ref Range   Troponin I <0.30  <0.30 ng/mL     Imaging Review Dg Chest 2 View  10/27/2013   CLINICAL DATA:  Fever  EXAM: CHEST  2 VIEW  COMPARISON:  10/19/2013  FINDINGS:  Cardiomediastinal silhouette is stable. No acute infiltrate or pleural effusion. No pulmonary edema. Mild infrahilar bronchitic changes without focal consolidation. Degenerative changes thoracic spine.  IMPRESSION: No acute infiltrate or pulmonary edema. Mild infrahilar bronchitic changes. Degenerative changes thoracic spine.   Electronically Signed   By: Natasha Mead M.D.   On: 10/27/2013 10:17   Dg Chest Port 1 View  10/09/2013   CLINICAL DATA:  Shortness of breath  EXAM: PORTABLE CHEST - 1 VIEW  COMPARISON:  PA and lateral chest of October 27, 2013  FINDINGS: The lungs are slightly less well inflated but positioning has changed. The interstitial markings are slightly increased in prominence. The cardiac silhouette remains top-normal in size. The central pulmonary vascularity is prominent. There is no pleural effusion. The bony thorax is unremarkable.  IMPRESSION: Mild interstitial edema is present and more conspicuous today. This may be related to underlying low-grade CHF.   Electronically Signed   By: David  [place]   On: 10/10/2013 12:09   Dg Chest Port 1 View  10/27/2013   CLINICAL DATA:  Fever  EXAM: PORTABLE CHEST - 1 VIEW  COMPARISON:  None.  FINDINGS: Apical lordotic positioning. Patient rotated left. Normal heart size for level of inspiration. No right and no definite left pleural effusion. No pneumothorax. Low lung volumes with resultant pulmonary interstitial prominence. Right lung is clear. The left lung base is poorly evaluated secondary to overlying soft tissues.  IMPRESSION: Suboptimal evaluation of the left lung base and pleural space secondary to overlying soft tissues. Given this factor, no explanation for pneumonia identified. PA and lateral radiographs may be helpful.   Electronically Signed   By: Jeronimo Greaves M.D.   On: 10/27/2013 09:18     EKG Interpretation   Date/Time:  Monday October 28 2013 11:25:23 EDT Ventricular Rate:  101 PR Interval:  158 QRS Duration: 92 QT Interval:   365 QTC Calculation: 473 R Axis:   80 Text Interpretation:  Sinus tachycardia Low voltage, precordial leads No  previous ECGs available Confirmed by ZACKOWSKI  MD, SCOTT ([id number]) on  09/30/2013 11:42:09 AM      CRITICAL CARE Performed by: Vanetta Mulders Total critical care time: 30 Critical care time was exclusive of separately billable procedures and treating other patients. Critical care was necessary to treat or prevent imminent or life-threatening  deterioration. Critical care was time spent personally by me on the following activities: development of treatment plan with patient and/or surrogate as well as nursing, discussions with consultants, evaluation of patient's response to treatment, examination of patient, obtaining history from patient or surrogate, ordering and performing treatments and interventions, ordering and review of laboratory studies, ordering and review of radiographic studies, pulse oximetry and re-evaluation of patient's condition.     MDM   Final diagnoses:  Sepsis, due to unspecified organism    Patient arrived at.respiratory distress. Initial blood pressure was a systolic of 84. We had others that were much higher. However we started doing manual pressures. Patient's pressure seemed to be in the 90s systolic. Now with fluid resuscitation pressures were up in the low 100 systolic. Patient clearly had respiratory distress. Significant fever. Now up to 103. Overall the with fluid resuscitation cultures done antibiotic started patient with some improvement in skin color making good urine. Chest x-ray negative for pneumonia or pulmonary edema. Urinalysis without evidence of urinary tract infection. Patient started on Zosyn and Vanco this appears to be a septic or pre-septic presentation. Of note is that are likely gas it is greater than 4.  Although patient improving still working hard to breathe with rapid respirations. Will place her on BiPAP to help rest her. We'll  also give a try of a nebulizer of albuterol and Atrovent to see if that helps with her breathing. There has been some bilateral faint wheezing.  Patient has ulcer type lesions in her mouth this may very well be a viral process. Patient has mild elevation of her liver function tests. Patient's abdomen is soft and nontender no evidence of an acute abdominal process.  Patient received 2 L of normal saline with improvement in blood pressure and urine output.  Patient was seen yesterday. They did recommend admission patient declined. There was evidence of possible urinary tract infection. Patient was given Rocephin. And sent home on Levaquin. Patient has a history of seizures but no seizure activity. Patient treated with Lamictal for seizures.  I personally performed the services described in this documentation, which was scribed in my presence. The recorded information has been reviewed and is accurate.    Vanetta MuldersScott Zackowski, MD 10/23/2013 1424   Addendum: Hospital is here wants us to talk to critical care down to [facility] to see whether admission they are under their care would be appropriate. Patient is blood pressures as stated above had never been below 90 systolic over doing manual pressures. The machine was giving his pressures of like 8487 and 164. Survey. Were not accurate. Patient's doing very well on BiPAP very comfortable good skin color. Was recent blood pressure was 98 systolic. Nursing instructed to start a second IV. Patient does not require pressor agents at this time.  Vanetta MuldersScott Zackowski, MD 10/05/2013 1447   Addendum #2:  Discuss with critical care [facility]. They felt patient did not need to come to critical care service. Was more of a step down admission at this point in time. Recommended to the hospital service here as they did not want to the patient here they could transfer to their service down to [facility]. We discussed with hospitalist service here or locally admit her to the ICU.   Patient  is improving she is mentating better she feels states that she feels much better. Color is better the BiPAP is helping her respirations and resting her. We have yet to have a hypotensive blood pressure. Patient received 2 L of  fluid.   Vanetta Mulders, MD 10/18/2013 1531

## 2013-10-28 NOTE — ED Notes (Signed)
RT at bedside.

## 2013-10-28 NOTE — ED Notes (Signed)
Attempted report to receiving floor. Reported would call back once nurse was available.

## 2013-10-28 NOTE — Procedures (Addendum)
Central Venous Catheter Insertion Procedure Note Tracie [name] [id number] 05/29/1945  Procedure: Insertion of Central Venous Catheter Indications: Assessment of intravascular volume and Drug and/or fluid administration  Procedure Details Consent: Risks of procedure as well as the alternatives and risks of each were explained to the (patient/caregiver).  Consent for procedure obtained. Time Out: Verified patient identification, verified procedure, site/side was marked, verified correct patient position, special equipment/implants available, medications/allergies/relevent history reviewed, required imaging and test results available.  Performed  Maximum sterile technique was used including antiseptics, cap, gloves, gown, hand hygiene, mask and sheet. Skin prep: Chlorhexidine; local anesthetic administered A antimicrobial bonded/coated triple lumen catheter was placed in the right internal jugular vein using the Seldinger technique.  Evaluation Blood flow good Complications: No apparent complications Patient did tolerate procedure well. Chest X-ray ordered to verify placement.  CXR: normal.  Procedure performed by Tracie BussingHoffman, NP under my direct supervision  Ultrasound used for site verification, live visualisation of needle entry & guidewire prior to dilation  Tracie MilchALVA,Tracie V. MD 10/19/2013, 11:48 PM

## 2013-10-28 NOTE — Progress Notes (Signed)
Attempted to collect sputum from pt. Pt is unable to produce sputum at this time. Pt also has a weak non productive cough. RT will continue to attempt to get sample.

## 2013-10-28 NOTE — ED Notes (Signed)
EDP aware that pt fever remains elevated at 103 and no new orders given.

## 2013-10-28 NOTE — H&P (Signed)
PULMONARY / CRITICAL CARE MEDICINE   Name: Tracie [name] MRN: [id number] DOB: 06/15/1945    ADMISSION DATE:  10/15/2013 CONSULTATION DATE:  6/39/2015  REFERRING MD :  EDP PRIMARY SERVICE: PCCM  CHIEF COMPLAINT:  SOB  BRIEF PATIENT DESCRIPTION: [age] y.o. F transferred from [facility] with septic shock, unclear source.  PCCM was consulted.  SIGNIFICANT EVENTS / STUDIES:  6/28 - presented to ED with SOB, treated w rocephin for UTI and sent home with levaquin for bronchitis. 6/29 - back to ED in respiratory distress, hypotensive, lactic acidosis, fever to 104.  [facility].  LINES / TUBES: Foley 6/29 >>>  CULTURES: Blood 6/29 >>> Urine 6/29 >>> Respiratory 6/29  >>> RVP 6/29 >>>  ANTIBIOTICS: Zosyn 6/29 >>> Vancomycin 6/29 >>>  HISTORY OF PRESENT ILLNESS:  Pt is encephalopathic; therefore, this HPI is obtained from chart review. Mrs. Tracie [name] is a 68 y.o. F with PMH as below.  She presented to [facility] 6/29 in respiratory distress.  In ED, workup demonstrated findings suggestive of septic shock with unclear etiology. Lactate 5 - She was started on BiPAP and was subsequently [facility] for further evaluation and management. Of note, pt went to ED one day prior (6/28) for SOB.  She was found to have UTI, treated with Rocephin.  She was offered admission for bronchitis which she refused. Lactate 3  She was sent home with Levaquin. Additionally, chart review shows that pt was recently given Bactrim for abx.  She also has some lesions in her mouth, unknown how long they have been present for.  PAST MEDICAL HISTORY :  Past Medical History  Diagnosis Date  . Sleep apnea     somewhat compliant with CPAP  . CVA (cerebral infarction) 1970  . Seizures   . Gout   . Hyperlipidemia   . Vertigo    Past Surgical History  Procedure Laterality Date  . Cholecystectomy    . Knee arthroscopy      Right   Prior to Admission medications   Medication Sig Start Date End Date Taking?  Authorizing Provider  acetaminophen (TYLENOL) 500 MG tablet Take 500-1,000 mg by mouth 2 (two) times daily.   Yes Historical Provider, MD  albuterol (PROVENTIL HFA;VENTOLIN HFA) 108 (90 BASE) MCG/ACT inhaler Inhale 2 puffs into the lungs every 6 (six) hours as needed for wheezing or shortness of breath.   Yes Historical Provider, MD  HYDROcodone-homatropine (HYCODAN) 5-1.5 MG/5ML syrup Take 5 mLs by mouth at bedtime.   Yes Historical Provider, MD  lamoTRIgine (LAMICTAL) 100 MG tablet Take 100 mg by mouth 3 (three) times daily.  08/01/09  Yes Historical Provider, MD  levofloxacin (LEVAQUIN) 500 MG tablet Take 1 tablet (500 mg total) by mouth daily. 10/27/13  Yes Rolland PorterMark James, MD  promethazine (PHENERGAN) 25 MG tablet Take 25 mg by mouth every 6 (six) hours as needed for nausea or vomiting.   Yes Historical Provider, MD  sulfamethoxazole-trimethoprim (BACTRIM DS) 800-160 MG per tablet Take 1 tablet by mouth 2 (two) times daily.   Yes Historical Provider, MD  Vitamin D, Ergocalciferol, (DRISDOL) 50000 UNITS CAPS capsule Take 50,000 Units by mouth every 7 (seven) days. On Saturday.   Yes Historical Provider, MD   No Known Allergies  FAMILY HISTORY:  Family History  Problem Relation Age of Onset  . Heart attack Father    SOCIAL HISTORY:  reports that she has never smoked. She does not have any smokeless tobacco history on file. She  reports that she does not drink alcohol or use illicit drugs.  REVIEW OF SYSTEMS:  Unable to complete as pt is encephalopathic.   SUBJECTIVE:  Fever increased from 103 at [facility] to 105 in ICU.  Tachypneic into 30's.  Answers some questions appropriately but is at least somewhat confused.  VITAL SIGNS: Temp:  [100.4 F (38 C)-105 F (40.6 C)] 105 F (40.6 C) (06/29 1830) Pulse Rate:  [93-108] 108 (06/29 1830) Resp:  [27-35] 35 (06/29 1830) BP: (82-121)/(52-70) 111/68 mmHg (06/29 1830) SpO2:  [96 %-100 %] 100 % (06/29 1830) FiO2 (%):  [40 %] 40 % (06/29 1806) Weight:   [100.8 kg (222 lb 3.6 oz)] 100.8 kg (222 lb 3.6 oz) (06/29 1830) HEMODYNAMICS:   VENTILATOR SETTINGS: Vent Mode:  [-] BIPAP FiO2 (%):  [40 %] 40 % Set Rate:  [10 bmp] 10 bmp INTAKE / OUTPUT: Intake/Output     06/29 0701 - 06/30 0700   P.O. 16   I.V. (mL/kg) 100 (1)   Total Intake(mL/kg) 116 (1.2)   Urine (mL/kg/hr) 375   Total Output 375   Net -259         PHYSICAL EXAMINATION: General: Elderly appearing female, anxious, on BiPAP. Neuro: Mildly confused.  HEENT: [place]/AT. PERRL, sclerae anicteric. Oral lesions Cardiovascular: RRR, no M/R/G.  Lungs: Respirations rapid and shallow.  On BiPAP.  No W/R/R. Abdomen: BS x 4, soft, NT/ND.  Musculoskeletal: No gross deformities, no edema. Peripheral pulses + Skin: Intact, warm, blanching  Rash around neck & abdomen.    LABS:  CBC  Recent Labs Lab 10/27/13 0748 10/12/2013 1130 10/20/2013 1140  WBC 5.9  --  7.1  HGB 14.3  --  13.6  HCT 40.2  --  38.8  PLT 45* 52* 52*   Coag's  Recent Labs Lab 10/25/2013 1130  APTT 43*  INR 1.34   BMET  Recent Labs Lab 10/27/13 0748 10/27/2013 1140  NA 130* 135*  K 3.5* 3.7  CL 91* 97  CO2 20 20  BUN 33* 34*  CREATININE 1.77* 1.48*  GLUCOSE 155* 109*   Electrolytes  Recent Labs Lab 10/27/13 0748 10/24/2013 1140  CALCIUM 8.4 7.6*   Sepsis Markers  Recent Labs Lab 10/27/13 0816 10/06/2013 1218  LATICACIDVEN 3.49* 5.1*   ABG  Recent Labs Lab 10/03/2013 1305  PHART 7.418  PCO2ART 24.3*  PO2ART 65.6*   Liver Enzymes  Recent Labs Lab 10/10/2013 1140  AST 238*  ALT 116*  ALKPHOS 88  BILITOT 1.4*  ALBUMIN 2.9*   Cardiac Enzymes  Recent Labs Lab 10/19/2013 1140  TROPONINI <0.30  PROBNP 2537.0*   Glucose No results found for this basename: GLUCAP,  in the last 168 hours  Imaging Dg Chest 2 View  10/27/2013   CLINICAL DATA:  Fever  EXAM: CHEST  2 VIEW  COMPARISON:  10/19/2013  FINDINGS: Cardiomediastinal silhouette is stable. No acute infiltrate or pleural  effusion. No pulmonary edema. Mild infrahilar bronchitic changes without focal consolidation. Degenerative changes thoracic spine.  IMPRESSION: No acute infiltrate or pulmonary edema. Mild infrahilar bronchitic changes. Degenerative changes thoracic spine.   Electronically Signed   By: Natasha Mead M.D.   On: 10/27/2013 10:17   Dg Chest Port 1 View  09/30/2013   CLINICAL DATA:  Shortness of breath  EXAM: PORTABLE CHEST - 1 VIEW  COMPARISON:  PA and lateral chest of October 27, 2013  FINDINGS: The lungs are slightly less well inflated but positioning has changed. The interstitial markings are slightly  increased in prominence. The cardiac silhouette remains top-normal in size. The central pulmonary vascularity is prominent. There is no pleural effusion. The bony thorax is unremarkable.  IMPRESSION: Mild interstitial edema is present and more conspicuous today. This may be related to underlying low-grade CHF.   Electronically Signed   By: David  [place]   On: 10/04/2013 12:09   Dg Chest Port 1 View  10/27/2013   CLINICAL DATA:  Fever  EXAM: PORTABLE CHEST - 1 VIEW  COMPARISON:  None.  FINDINGS: Apical lordotic positioning. Patient rotated left. Normal heart size for level of inspiration. No right and no definite left pleural effusion. No pneumothorax. Low lung volumes with resultant pulmonary interstitial prominence. Right lung is clear. The left lung base is poorly evaluated secondary to overlying soft tissues.  IMPRESSION: Suboptimal evaluation of the left lung base and pleural space secondary to overlying soft tissues. Given this factor, no explanation for pneumonia identified. PA and lateral radiographs may be helpful.   Electronically Signed   By: Jeronimo Greaves M.D.   On: 10/27/2013 09:18     ASSESSMENT / PLAN:  PULMONARY A: Acute Respiratory Failure Mild Pulmonary Edema Compensated respiratory alkalosis OSA P:   - Continuous BiPAP for now, though low threshold for intubation. - CXR in  AM.  CARDIOVASCULAR A:  Septic Shock -lactate 5 Elevated BNP P:  - Goal MAP > 65, no pressors at this time, if hypotension recurs, would place CVL & chk CVp  - Trend lactate. - Check cortisol. - Echo in AM, assess for ? underlying CHF.  RENAL A:   AG metabolic acidosis - lactate. AKI - SCr improved from 6/28. P:   - NS @ 75. - Trend lactate. - Renal US. - CMP in AM.  GASTROINTESTINAL A:   Nutrition Transaminitis - AST 2x ALT, ? ETOH (also note hypoalbuminemia). P:   - NPO for now. - CMP in AM. -obtian h/o ETOH use once more lucid  HEMATOLOGIC A:  Thrombocytopenia - mildly improved from 6/28.  ? Hypersplenism from underlying liver dz, ? Sepsis ? DIC with decreased fibrinogen and increased PTT, aPTT. P:  - VTE Proph:  SCD's only. - If no improvement, consider more detailed workup of thrombocytopenia. - CBC in AM.  -rpt DIC profile in am  INFECTIOUS A:   Septic Shock R/o influenza UTI - resolved (treated 6/28). P:   - Cultures and antibiotics as above, narrow abx as cultures result. - PCT Algorithm.  ENDOCRINE A:   No acute issues. P:   - Monitor glucose on BMP.  NEUROLOGIC A:   Hx of Seizures Acute encephalopathy P:   - Hold outpatient Lamictal (see DERM section below). - Consider prophylactic Keppra or other anticonvulsant. - Monitor.  DERMATOLOGY A: Oral lesions - given recent Bactrim + outpatient lamictal and now with oral lesions along with significant fever / sepsis picture, question whether this could be early SJS  P: - No Bactrim, hold Lamictal. - Supportive care. - Monitor.   Rutherford Guys, PA - [facility] Pgr: [phone]  or [phone]  Summary - Septic shock , responded to fluids, appears euvolemic, source -? UTI vs pneumonia - unclear cause of deranged LFTs & coags  I have personally obtained a history, examined the patient, evaluated laboratory and imaging results, formulated the  assessment and plan and placed orders. CRITICAL CARE: The patient is critically ill with multiple organ systems failure and requires high complexity decision  making for assessment and support, frequent evaluation and titration of therapies, application of advanced monitoring technologies and extensive interpretation of multiple databases. Critical Care Time devoted to patient care services described in this note is 60 minutes.   Cyril Mourningakesh Alva MD. Tonny BollmanFCCP. [facility] Pager [phone] If no response call [phone]    10/10/2013, 7:14 PM

## 2013-10-28 NOTE — ED Notes (Addendum)
Attempted report again. No answer on unit. Will try again later. [facility] still at bedside.

## 2013-10-28 NOTE — Progress Notes (Signed)
ED physician, Dr. Deretha EmoryZackowski requested admission to the hospitalist service at [facility] for a 68 year old who presents with cough, weakness, and confusion. Brief history reveals that the patient is septic, unknown etiology, but suspect a pulmonary origin. In the ED, her fever was as high as 103.7. Her blood pressures were in the 80s systolically, but improved to the low 100s following IV fluid hydration. She is in acute renal failure with a creatinine of 1.48. She has respiratory failure with hypoxia with a pH of 7.4/PCO2 of 24/PO2 of 66. She has thrombocytopenia with a platelet count of 52, suspicious for DIC. Surprisingly, her white blood cell count is within normal limits. Her two-view chest x-ray reveals no infiltrate or edema, but her followup portable x-ray reveals pulmonary edema. Her liver transaminases are elevated with an AST of 238 and an ALT of 116. Her lactic acid level is 5.1. She is mildly encephalopathic. She has a few ulcerated lesions on her tongue. She received outpatient Levaquin and Bactrim for treatment of acute bronchitis previously.  I discussed the patient with intensivist, Dr. Sherene SiresWert at [facility]. I believe she needs to be in an ICU setting at a tertiary care center for further monitoring, diagnostic workup, and management. Dr. Sherene SiresWert has agreed to accept the patient for treatment. The family has been kept abreast of the findings and decision making and they are in [facility] with [facility].

## 2013-10-28 NOTE — ED Notes (Signed)
[facility] [phone].

## 2013-10-28 NOTE — ED Notes (Signed)
Lab at bedside

## 2013-10-28 NOTE — Progress Notes (Signed)
Repeat lactate rising. Increased wob on bipap, T 105. Decision made to intubate. Intubated uneventfully with 2 mg versed, 200 mcg fent in divided doses & 20 mg etomidate using glidescope. Sedated with versed/ fent gtt, rocuronium 50 x 1 after adequate sedation given. Drop in satn requiring recruitment with PEEP. CXR showed ETT, CVL in position, no new infiltrates. Bedside echo performed  -hyperdynamic LV, RV did not appear enlarged, unlikely PE - will obtain duplex in am.  Addn cc time x 35mins  [name],RAKESH V. MD

## 2013-10-29 ENCOUNTER — Inpatient Hospital Stay (HOSPITAL_COMMUNITY): Payer: Medicare HMO

## 2013-10-29 DIAGNOSIS — J9600 Acute respiratory failure, unspecified whether with hypoxia or hypercapnia: Secondary | ICD-10-CM

## 2013-10-29 DIAGNOSIS — R509 Fever, unspecified: Secondary | ICD-10-CM

## 2013-10-29 DIAGNOSIS — R7401 Elevation of levels of liver transaminase levels: Secondary | ICD-10-CM

## 2013-10-29 DIAGNOSIS — R0609 Other forms of dyspnea: Secondary | ICD-10-CM

## 2013-10-29 DIAGNOSIS — A419 Sepsis, unspecified organism: Secondary | ICD-10-CM

## 2013-10-29 DIAGNOSIS — W57XXXA Bitten or stung by nonvenomous insect and other nonvenomous arthropods, initial encounter: Secondary | ICD-10-CM

## 2013-10-29 DIAGNOSIS — E872 Acidosis, unspecified: Secondary | ICD-10-CM

## 2013-10-29 DIAGNOSIS — T148 Other injury of unspecified body region: Secondary | ICD-10-CM

## 2013-10-29 DIAGNOSIS — R7989 Other specified abnormal findings of blood chemistry: Secondary | ICD-10-CM

## 2013-10-29 DIAGNOSIS — I519 Heart disease, unspecified: Secondary | ICD-10-CM

## 2013-10-29 DIAGNOSIS — R0989 Other specified symptoms and signs involving the circulatory and respiratory systems: Secondary | ICD-10-CM

## 2013-10-29 DIAGNOSIS — R21 Rash and other nonspecific skin eruption: Secondary | ICD-10-CM

## 2013-10-29 DIAGNOSIS — N170 Acute kidney failure with tubular necrosis: Secondary | ICD-10-CM

## 2013-10-29 DIAGNOSIS — R740 Nonspecific elevation of levels of transaminase and lactic acid dehydrogenase [LDH]: Secondary | ICD-10-CM

## 2013-10-29 DIAGNOSIS — M7989 Other specified soft tissue disorders: Secondary | ICD-10-CM

## 2013-10-29 DIAGNOSIS — D696 Thrombocytopenia, unspecified: Secondary | ICD-10-CM

## 2013-10-29 DIAGNOSIS — M2550 Pain in unspecified joint: Secondary | ICD-10-CM

## 2013-10-29 DIAGNOSIS — R51 Headache: Secondary | ICD-10-CM

## 2013-10-29 LAB — POCT I-STAT 3, ART BLOOD GAS (G3+)
ACID-BASE DEFICIT: 11 mmol/L — AB (ref 0.0–2.0)
Acid-base deficit: 14 mmol/L — ABNORMAL HIGH (ref 0.0–2.0)
BICARBONATE: 15.9 meq/L — AB (ref 20.0–24.0)
Bicarbonate: 11.1 mEq/L — ABNORMAL LOW (ref 20.0–24.0)
O2 Saturation: 77 %
O2 Saturation: 96 %
PCO2 ART: 27.5 mmHg — AB (ref 35.0–45.0)
PH ART: 7.222 — AB (ref 7.350–7.450)
PO2 ART: 56 mmHg — AB (ref 80.0–100.0)
Patient temperature: 101.8
Patient temperature: 101.7
TCO2: 17 mmol/L (ref 0–100)
TCO2: 12 mmol/L (ref 0–100)
pCO2 arterial: 41.7 mmHg (ref 35.0–45.0)
pH, Arterial: 7.198 — CL (ref 7.350–7.450)
pO2, Arterial: 105 mmHg — ABNORMAL HIGH (ref 80.0–100.0)

## 2013-10-29 LAB — PROCALCITONIN: PROCALCITONIN: 23.98 ng/mL

## 2013-10-29 LAB — COMPREHENSIVE METABOLIC PANEL
ALK PHOS: 84 U/L (ref 39–117)
ALT: 101 U/L — ABNORMAL HIGH (ref 0–35)
AST: 264 U/L — AB (ref 0–37)
Albumin: 2.4 g/dL — ABNORMAL LOW (ref 3.5–5.2)
BUN: 41 mg/dL — ABNORMAL HIGH (ref 6–23)
CALCIUM: 6.7 mg/dL — AB (ref 8.4–10.5)
CO2: 14 meq/L — AB (ref 19–32)
Chloride: 101 mEq/L (ref 96–112)
Creatinine, Ser: 1.53 mg/dL — ABNORMAL HIGH (ref 0.50–1.10)
GFR calc Af Amer: 40 mL/min — ABNORMAL LOW (ref >90)
GFR calc non Af Amer: 34 mL/min — ABNORMAL LOW (ref >90)
Glucose, Bld: 116 mg/dL — ABNORMAL HIGH (ref 70–99)
POTASSIUM: 4 meq/L (ref 3.7–5.3)
SODIUM: 137 meq/L (ref 137–147)
TOTAL PROTEIN: 5.6 g/dL — AB (ref 6.0–8.3)
Total Bilirubin: 1.5 mg/dL — ABNORMAL HIGH (ref 0.3–1.2)

## 2013-10-29 LAB — CORTISOL: CORTISOL PLASMA: 51.7 ug/dL

## 2013-10-29 LAB — CBC WITH DIFFERENTIAL/PLATELET
BASOS ABS: 0.1 10*3/uL (ref 0.0–0.1)
Basophils Relative: 1 % (ref 0–1)
EOS ABS: 0 10*3/uL (ref 0.0–0.7)
Eosinophils Relative: 0 % (ref 0–5)
HCT: 37.4 % (ref 36.0–46.0)
Hemoglobin: 12.6 g/dL (ref 12.0–15.0)
LYMPHS PCT: 7 % — AB (ref 12–46)
Lymphs Abs: 0.5 10*3/uL — ABNORMAL LOW (ref 0.7–4.0)
MCH: 29.6 pg (ref 26.0–34.0)
MCHC: 33.7 g/dL (ref 30.0–36.0)
MCV: 87.8 fL (ref 78.0–100.0)
Monocytes Absolute: 0.3 10*3/uL (ref 0.1–1.0)
Monocytes Relative: 4 % (ref 3–12)
NEUTROS ABS: 6.9 10*3/uL (ref 1.7–7.7)
Neutrophils Relative %: 88 % — ABNORMAL HIGH (ref 43–77)
PLATELETS: 42 10*3/uL — AB (ref 150–400)
RBC: 4.26 MIL/uL (ref 3.87–5.11)
RDW: 15.3 % (ref 11.5–15.5)
WBC: 7.8 10*3/uL (ref 4.0–10.5)

## 2013-10-29 LAB — BLOOD GAS, ARTERIAL
Acid-base deficit: 11 mmol/L — ABNORMAL HIGH (ref 0.0–2.0)
Acid-base deficit: 9.9 mmol/L — ABNORMAL HIGH (ref 0.0–2.0)
Acid-base deficit: 9.9 mmol/L — ABNORMAL HIGH (ref 0.0–2.0)
BICARBONATE: 14.3 meq/L — AB (ref 20.0–24.0)
BICARBONATE: 15.9 meq/L — AB (ref 20.0–24.0)
BICARBONATE: 15.9 meq/L — AB (ref 20.0–24.0)
DRAWN BY: 26513
Drawn by: 331761
Drawn by: 331761
FIO2: 1 %
FIO2: 60 %
FIO2: 1 %
MECHVT: 500 mL
MECHVT: 500 mL
MECHVT: 550 mL
O2 SAT: 97.7 %
O2 SAT: 97.7 %
O2 Saturation: 98.3 %
PATIENT TEMPERATURE: 100.8
PATIENT TEMPERATURE: 101.4
PEEP/CPAP: 5 cmH2O
PEEP: 5 cmH2O
PEEP: 5 cmH2O
PH ART: 7.275 — AB (ref 7.350–7.450)
PH ART: 7.229 — AB (ref 7.350–7.450)
Patient temperature: 101.4
RATE: 25 resp/min
RATE: 25 resp/min
RATE: 25 resp/min
TCO2: 15.2 mmol/L (ref 0–100)
TCO2: 17.1 mmol/L (ref 0–100)
TCO2: 17.1 mmol/L (ref 0–100)
pCO2 arterial: 32.3 mmHg — ABNORMAL LOW (ref 35.0–45.0)
pCO2 arterial: 40.5 mmHg (ref 35.0–45.0)
pCO2 arterial: 40.5 mmHg (ref 35.0–45.0)
pH, Arterial: 7.229 — ABNORMAL LOW (ref 7.350–7.450)
pO2, Arterial: 132 mmHg — ABNORMAL HIGH (ref 80.0–100.0)
pO2, Arterial: 132 mmHg — ABNORMAL HIGH (ref 80.0–100.0)
pO2, Arterial: 138 mmHg — ABNORMAL HIGH (ref 80.0–100.0)

## 2013-10-29 LAB — URINE CULTURE
COLONY COUNT: NO GROWTH
CULTURE: NO GROWTH
Colony Count: NO GROWTH
Colony Count: NO GROWTH
Culture: NO GROWTH
Culture: NO GROWTH
SPECIAL REQUESTS: NORMAL

## 2013-10-29 LAB — PHOSPHORUS
Phosphorus: 2.3 mg/dL (ref 2.3–4.6)
Phosphorus: 2.5 mg/dL (ref 2.3–4.6)
Phosphorus: 2.5 mg/dL (ref 2.3–4.6)

## 2013-10-29 LAB — CBC
HEMATOCRIT: 36.8 % (ref 36.0–46.0)
HEMOGLOBIN: 12.8 g/dL (ref 12.0–15.0)
MCH: 29.8 pg (ref 26.0–34.0)
MCHC: 34.8 g/dL (ref 30.0–36.0)
MCV: 85.8 fL (ref 78.0–100.0)
Platelets: 43 10*3/uL — ABNORMAL LOW (ref 150–400)
RBC: 4.29 MIL/uL (ref 3.87–5.11)
RDW: 14.8 % (ref 11.5–15.5)
WBC: 7.9 10*3/uL (ref 4.0–10.5)

## 2013-10-29 LAB — GLUCOSE, CAPILLARY
GLUCOSE-CAPILLARY: 100 mg/dL — AB (ref 70–99)
GLUCOSE-CAPILLARY: 47 mg/dL — AB (ref 70–99)
GLUCOSE-CAPILLARY: 98 mg/dL (ref 70–99)
Glucose-Capillary: 92 mg/dL (ref 70–99)
Glucose-Capillary: 99 mg/dL (ref 70–99)
Glucose-Capillary: 68 mg/dL — ABNORMAL LOW (ref 70–99)
Glucose-Capillary: 120 mg/dL — ABNORMAL HIGH (ref 70–99)
Glucose-Capillary: 179 mg/dL — ABNORMAL HIGH (ref 70–99)

## 2013-10-29 LAB — STREP PNEUMONIAE URINARY ANTIGEN: Strep Pneumo Urinary Antigen: NEGATIVE

## 2013-10-29 LAB — CARBOXYHEMOGLOBIN
Carboxyhemoglobin: 0.3 % — ABNORMAL LOW (ref 0.5–1.5)
Carboxyhemoglobin: 0.5 % (ref 0.5–1.5)
METHEMOGLOBIN: 0.8 % (ref 0.0–1.5)
METHEMOGLOBIN: 0.9 % (ref 0.0–1.5)
O2 Saturation: 75 %
O2 Saturation: 84.2 %
TOTAL HEMOGLOBIN: 13.4 g/dL (ref 12.0–16.0)
Total hemoglobin: 13 g/dL (ref 12.0–16.0)

## 2013-10-29 LAB — AMYLASE: Amylase: 83 U/L (ref 0–105)

## 2013-10-29 LAB — MAGNESIUM
Magnesium: 2 mg/dL (ref 1.5–2.5)
Magnesium: 2 mg/dL (ref 1.5–2.5)
Magnesium: 1.9 mg/dL (ref 1.5–2.5)

## 2013-10-29 LAB — LACTIC ACID, PLASMA
Lactic Acid, Venous: 3.9 mmol/L — ABNORMAL HIGH (ref 0.5–2.2)
Lactic Acid, Venous: 4 mmol/L — ABNORMAL HIGH (ref 0.5–2.2)

## 2013-10-29 LAB — LACTATE DEHYDROGENASE: LDH: 601 U/L — ABNORMAL HIGH (ref 94–250)

## 2013-10-29 MED ORDER — DEXTROSE 50 % IV SOLN
INTRAVENOUS | Status: AC
  Filled 2013-10-29: qty 50

## 2013-10-29 MED ORDER — LEVOFLOXACIN IN D5W 750 MG/150ML IV SOLN: 750.0000 mg | INTRAVENOUS | Status: DC

## 2013-10-29 MED ORDER — VITAL HIGH PROTEIN PO LIQD
1000.0000 mL | ORAL | Status: DC
  Administered 2013-10-29: 1000 mL
  Filled 2013-10-29 (×2): qty 1000

## 2013-10-29 MED ORDER — PRO-STAT SUGAR FREE PO LIQD
30.0000 mL | Freq: Two times a day (BID) | ORAL | Status: DC
  Administered 2013-10-29: 30 mL
  Filled 2013-10-29 (×3): qty 30

## 2013-10-29 MED ORDER — INSULIN ASPART 100 UNIT/ML ~~LOC~~ SOLN
0.0000 [IU] | SUBCUTANEOUS | Status: DC
  Administered 2013-10-29: 3 [IU] via SUBCUTANEOUS
  Administered 2013-10-29 – 2013-10-30 (×3): 2 [IU] via SUBCUTANEOUS

## 2013-10-29 MED ORDER — SODIUM CHLORIDE 0.9 % IV BOLUS (SEPSIS): 1000.0000 mL | INTRAVENOUS | Status: DC | PRN

## 2013-10-29 MED ORDER — NOREPINEPHRINE BITARTRATE 1 MG/ML IV SOLN
5.0000 ug/min | INTRAVENOUS | Status: DC
  Administered 2013-10-29: 20 ug/min via INTRAVENOUS
  Administered 2013-10-29: 5 ug/min via INTRAVENOUS
  Filled 2013-10-29 (×3): qty 4

## 2013-10-29 MED ORDER — BIOTENE DRY MOUTH MT LIQD
15.0000 mL | Freq: Four times a day (QID) | OROMUCOSAL | Status: DC
  Administered 2013-10-29 – 2013-11-01 (×11): 15 mL via OROMUCOSAL

## 2013-10-29 MED ORDER — BIOTENE DRY MOUTH MT LIQD: 15.0000 mL | Freq: Two times a day (BID) | OROMUCOSAL | Status: DC

## 2013-10-29 MED ORDER — DOXYCYCLINE HYCLATE 100 MG IV SOLR
100.0000 mg | Freq: Two times a day (BID) | INTRAVENOUS | Status: DC
  Administered 2013-10-29 – 2013-11-07 (×19): 100 mg via INTRAVENOUS
  Filled 2013-10-29 (×20): qty 100

## 2013-10-29 MED ORDER — VITAL HIGH PROTEIN PO LIQD
1000.0000 mL | ORAL | Status: DC
  Administered 2013-10-29: 1000 mL
  Administered 2013-10-30: 06:00:00
  Administered 2013-10-30 – 2013-11-04 (×7): 1000 mL
  Filled 2013-10-29 (×11): qty 1000

## 2013-10-29 MED ORDER — ETOMIDATE 2 MG/ML IV SOLN: 20.0000 mg | Freq: Once | INTRAVENOUS | Status: DC

## 2013-10-29 MED ORDER — SODIUM CHLORIDE 0.9 % IV SOLN
1.0000 g | Freq: Once | INTRAVENOUS | Status: AC
  Administered 2013-10-29: 1 g via INTRAVENOUS
  Filled 2013-10-29: qty 10

## 2013-10-29 MED ORDER — LEVETIRACETAM 100 MG/ML PO SOLN
500.0000 mg | Freq: Two times a day (BID) | ORAL | Status: DC
  Administered 2013-10-29 – 2013-11-04 (×13): 500 mg
  Filled 2013-10-29 (×17): qty 5

## 2013-10-29 MED ORDER — NOREPINEPHRINE BITARTRATE 1 MG/ML IV SOLN
5.0000 ug/min | INTRAVENOUS | Status: DC
  Administered 2013-10-29: 25 ug/min via INTRAVENOUS
  Administered 2013-10-30: 38 ug/min via INTRAVENOUS
  Administered 2013-10-30: 25 ug/min via INTRAVENOUS
  Administered 2013-10-31: 30 ug/min via INTRAVENOUS
  Administered 2013-10-31: 25 ug/min via INTRAVENOUS
  Filled 2013-10-29 (×7): qty 16

## 2013-10-29 NOTE — Consult Note (Signed)
[facility]          Total days of antibiotics 2        Day 2 vanco        Day 2 piptazo        Day 1 doxycycline     Reason for Consult:fever, respiratory distress, tick bite    Referring Physician: feinstein  Principal Problem:   Sepsis Active Problems:   Acute respiratory failure   Acute renal failure   Elevated LFTs   Acidosis   Encephalopathy acute   Thrombocytopenia   Septic shock   HPI: Tracie [name] is a 68 y.o. female with sepsis and Acute respiratory distress requiring intubation in the setting of pulmonary symptoms reports having history of tick bites in the last 7-10 days prior to admit. The patient with no known pulmonary disease sought care at her PCP 4 days PTA for cough x SOB was diagnosed with bronchitis and given rx for bactrim. She was seen initially at [facility] on 6/28 since her cough, shortness of breath also started having high fevers of 101-102F. She reported 3 day hx of n/v plus associated bilateral hip arthralgia. In the ED on 6/28, she was found to have wheezing at bases, temp of 102, CBc showed thrombocytopenia of 54, and AKI with Cr 1.77 and BUN 33, LA 3.5. She was offered to be admitted but declined, thus given rx for levofloxacin. She failed to improve overnight and returned to theED on 6/29 where she not only had fever of 103.80F, but also hypotension sBP 80-100s and progressive hypoxic respiratory failure requiring intubation. She was started on vancomycin and piptazo empirically for HCAP. Her family disclosed her being bitten by numerous ticks roughly 10 days ago, and kept them and brought to [place] for evaluation. In the jar, there are 9 ticks some neophytes, and adult ticks. 3 of ticks have white dot on back c/w lone star tick. They report her having the tick on her body possibly a few hours. She has large erythamatous lesion at one of tick bites on her back. Patient reports being hospitalized in the last 3 months, but not confirmed by  family  Past Medical History  Diagnosis Date  . Sleep apnea     somewhat compliant with CPAP  . CVA (cerebral infarction) 1970  . Seizures   . Gout   . Hyperlipidemia   . Vertigo     Allergies: No Known Allergies  MEDICATIONS: . antiseptic oral rinse  15 mL Mouth Rinse QID  . chlorhexidine  15 mL Mouth Rinse BID  . dextrose      . doxycycline (VIBRAMYCIN) IV  100 mg Intravenous Q12H  . feeding supplement (PRO-STAT SUGAR FREE 64)  30 mL Per Tube BID  . feeding supplement (VITAL HIGH PROTEIN)  1,000 mL Per Tube Q24H  . insulin aspart  0-9 Units Subcutaneous 6 times per day  . levETIRAcetam  500 mg Per Tube BID  . pantoprazole (PROTONIX) IV  40 mg Intravenous Q24H  . piperacillin-tazobactam (ZOSYN)  IV  3.375 g Intravenous 3 times per day  . vancomycin  750 mg Intravenous Q12H    History  Substance Use Topics  . Smoking status: Never Smoker   . Smokeless tobacco: Not on file  . Alcohol Use: No    Family History  Problem Relation Age of Onset  . Heart attack Father     Review of Systems - Unable to obtain ROS since patient is  intubated/sedated  OBJECTIVE: Temp:  [98.9 F (37.2 C)-105.2 F (40.7 C)] 101 F (38.3 C) (06/30 1445) Pulse Rate:  [93-123] 113 (06/30 1445) Resp:  [15-36] 28 (06/30 1445) BP: (71-174)/(44-144) 85/50 mmHg (06/30 1430) SpO2:  [91 %-100 %] 100 % (06/30 1445) FiO2 (%):  [40 %-100 %] 40 % (06/30 1200) Weight:  [222 lb 3.6 oz (100.8 kg)-223 lb 1.7 oz (101.2 kg)] 223 lb 1.7 oz (101.2 kg) (06/30 0419)  Constitutional:  oriented to person,opens eyes to name. Appears stated age. appears well-developed and well-nourished. No distress.  HENT: posterior pharynx has erythamatous lesions on posterior hard/soft palate ? Abrasion with intubation. They don't appear ulcerative. Mouth/Throat: Oropharynx is clear and moist. No oropharyngeal exudate.  Cardiovascular: tachy, regular rhythm and normal heart sounds. Exam reveals no gallop and no friction rub.    No murmur heard.  Pulmonary/Chest: course breath sounds bilaterally Abdominal: Soft. Bowel sounds are normal.  exhibits no distension. There is no tenderness. + rash Lymphadenopathy: no cervical adenopathy.  Skin: Skin is warm and dry. Her toes appear cyanotic but not cool to touch, her abdomen and arms have macular erythamatous lacy rash somewhat appearing similar to mottling  LABS: Results for orders placed during the hospital encounter of 10/23/2013 (from the past 48 hour(s))  BLOOD GAS, ARTERIAL     Status: Abnormal   Collection Time    10/13/2013 12:46 AM      Result Value Ref Range   FIO2 1.00     Delivery systems VENTILATOR     Mode PRESSURE REGULATED VOLUME CONTROL     VT 500     Rate 25.0     Peep/cpap 5.0     pH, Arterial 7.229 (*) 7.350 - 7.450   pCO2 arterial 40.5  35.0 - 45.0 mmHg   pO2, Arterial 132.0 (*) 80.0 - 100.0 mmHg   Bicarbonate 15.9 (*) 20.0 - 24.0 mEq/L   TCO2 17.1  0 - 100 mmol/L   Acid-base deficit 9.9 (*) 0.0 - 2.0 mmol/L   O2 Saturation 97.7     Patient temperature 101.4     Collection site RIGHT RADIAL     Drawn by [id number]     Sample type ARTERIAL DRAW     Allens test (pass/fail) PASS  PASS  DIC (DISSEMINATED INTRAVASCULAR COAGULATION) PANEL     Status: Abnormal   Collection Time    10/13/2013 11:30 AM      Result Value Ref Range   Prothrombin Time 16.6 (*) 11.6 - 15.2 seconds   INR 1.34  0.00 - 1.49   aPTT 43 (*) 24 - 37 seconds   Comment:            IF BASELINE aPTT IS ELEVATED,     SUGGEST PATIENT RISK ASSESSMENT     BE USED TO DETERMINE APPROPRIATE     ANTICOAGULANT THERAPY.   Fibrinogen 184 (*) 204 - 475 mg/dL   D-Dimer, Quant >20.00 (*) 0.00 - 0.48 ug/mL-FEU   Comment: RESULT CONFIRMED BY AUTOMATED DILUTION     RESULT REPEATED AND VERIFIED                AT THE INHOUSE ESTABLISHED CUTOFF     VALUE OF 0.48 ug/mL FEU,     THIS ASSAY HAS BEEN DOCUMENTED     IN THE LITERATURE TO HAVE     A SENSITIVITY AND NEGATIVE     PREDICTIVE VALUE OF  AT LEAST     98 TO 99%.  THE TEST  RESULT     SHOULD BE CORRELATED WITH     AN ASSESSMENT OF THE CLINICAL     PROBABILITY OF DVT / VTE.   Platelets 52 (*) 150 - 400 K/uL   Comment: SPECIMEN CHECKED FOR CLOTS     PLATELET COUNT CONFIRMED BY SMEAR   Smear Review NO SCHISTOCYTES SEEN     Comment: PLATELETS APPEAR DECREASED     LARGE PLATELETS PRESENT     GIANT PLATELETS SEEN  CBC WITH DIFFERENTIAL     Status: Abnormal   Collection Time    10/11/2013 11:40 AM      Result Value Ref Range   WBC 7.1  4.0 - 10.5 K/uL   RBC 4.57  3.87 - 5.11 MIL/uL   Hemoglobin 13.6  12.0 - 15.0 g/dL   HCT 38.8  36.0 - 46.0 %   MCV 84.9  78.0 - 100.0 fL   MCH 29.8  26.0 - 34.0 pg   MCHC 35.1  30.0 - 36.0 g/dL   RDW 14.1  11.5 - 15.5 %   Platelets 52 (*) 150 - 400 K/uL   Comment: SPECIMEN CHECKED FOR CLOTS     PLATELET COUNT CONFIRMED BY SMEAR   Neutrophils Relative % 87 (*) 43 - 77 %   Neutro Abs 6.2  1.7 - 7.7 K/uL   Lymphocytes Relative 10 (*) 12 - 46 %   Lymphs Abs 0.7  0.7 - 4.0 K/uL   Monocytes Relative 3  3 - 12 %   Monocytes Absolute 0.2  0.1 - 1.0 K/uL   Eosinophils Relative 0  0 - 5 %   Eosinophils Absolute 0.0  0.0 - 0.7 K/uL   Basophils Relative 0  0 - 1 %   Basophils Absolute 0.0  0.0 - 0.1 K/uL  COMPREHENSIVE METABOLIC PANEL     Status: Abnormal   Collection Time    10/15/2013 11:40 AM      Result Value Ref Range   Sodium 135 (*) 137 - 147 mEq/L   Potassium 3.7  3.7 - 5.3 mEq/L   Chloride 97  96 - 112 mEq/L   CO2 20  19 - 32 mEq/L   Glucose, Bld 109 (*) 70 - 99 mg/dL   BUN 34 (*) 6 - 23 mg/dL   Creatinine, Ser 1.48 (*) 0.50 - 1.10 mg/dL   Calcium 7.6 (*) 8.4 - 10.5 mg/dL   Total Protein 6.5  6.0 - 8.3 g/dL   Albumin 2.9 (*) 3.5 - 5.2 g/dL   AST 238 (*) 0 - 37 U/L   ALT 116 (*) 0 - 35 U/L   Alkaline Phosphatase 88  39 - 117 U/L   Total Bilirubin 1.4 (*) 0.3 - 1.2 mg/dL   GFR calc non Af Amer 36 (*) >90 mL/min   GFR calc Af Amer 41 (*) >90 mL/min   Comment: (NOTE)     The eGFR  has been calculated using the CKD EPI equation.     This calculation has not been validated in all clinical situations.     eGFR's persistently <90 mL/min signify possible Chronic Kidney     Disease.  LIPASE, BLOOD     Status: None   Collection Time    10/07/2013 11:40 AM      Result Value Ref Range   Lipase 47  11 - 59 U/L  PRO B NATRIURETIC PEPTIDE     Status: Abnormal   Collection Time    10/29/2013 11:40 AM  Result Value Ref Range   Pro B Natriuretic peptide (BNP) 2537.0 (*) 0 - 125 pg/mL  TROPONIN I     Status: None   Collection Time    10/25/2013 11:40 AM      Result Value Ref Range   Troponin I <0.30  <0.30 ng/mL   Comment:            Due to the release kinetics of cTnI,     a negative result within the first hours     of the onset of symptoms does not rule out     myocardial infarction with certainty.     If myocardial infarction is still suspected,     repeat the test at appropriate intervals.  CULTURE, BLOOD (ROUTINE X 2)     Status: None   Collection Time    10/05/2013 12:00 PM      Result Value Ref Range   Specimen Description BLOOD RIGHT ANTECUBITAL     Special Requests       Value: BOTTLES DRAWN AEROBIC AND ANAEROBIC AEB=12CC ANA=10CC   Culture NO GROWTH 1 DAY     Report Status PENDING    LACTIC ACID, PLASMA     Status: Abnormal   Collection Time    10/10/2013 12:18 PM      Result Value Ref Range   Lactic Acid, Venous 5.1 (*) 0.5 - 2.2 mmol/L  CULTURE, BLOOD (ROUTINE X 2)     Status: None   Collection Time    10/21/2013 12:25 PM      Result Value Ref Range   Specimen Description BLOOD LEFT HAND     Special Requests BOTTLES DRAWN AEROBIC ONLY 6CC     Culture NO GROWTH 1 DAY     Report Status PENDING    URINALYSIS, ROUTINE W REFLEX MICROSCOPIC     Status: Abnormal   Collection Time    10/29/2013 12:36 PM      Result Value Ref Range   Color, Urine AMBER (*) YELLOW   Comment: BIOCHEMICALS MAY BE AFFECTED BY COLOR   APPearance CLOUDY (*) CLEAR   Specific Gravity,  Urine 1.015  1.005 - 1.030   pH 5.5  5.0 - 8.0   Glucose, UA NEGATIVE  NEGATIVE mg/dL   Hgb urine dipstick NEGATIVE  NEGATIVE   Bilirubin Urine SMALL (*) NEGATIVE   Ketones, ur TRACE (*) NEGATIVE mg/dL   Protein, ur NEGATIVE  NEGATIVE mg/dL   Urobilinogen, UA 1.0  0.0 - 1.0 mg/dL   Nitrite NEGATIVE  NEGATIVE   Leukocytes, UA NEGATIVE  NEGATIVE   Comment: MICROSCOPIC NOT DONE ON URINES WITH NEGATIVE PROTEIN, BLOOD, LEUKOCYTES, NITRITE, OR GLUCOSE <1000 mg/dL.  BLOOD GAS, ARTERIAL     Status: Abnormal   Collection Time    10/11/2013  1:05 PM      Result Value Ref Range   O2 Content 1.0     Delivery systems NASAL CANNULA     pH, Arterial 7.418  7.350 - 7.450   pCO2 arterial 24.3 (*) 35.0 - 45.0 mmHg   pO2, Arterial 65.6 (*) 80.0 - 100.0 mmHg   Bicarbonate 15.4 (*) 20.0 - 24.0 mEq/L   TCO2 13.7  0 - 100 mmol/L   Acid-base deficit 8.3 (*) 0.0 - 2.0 mmol/L   O2 Saturation 93.5     Patient temperature 37.0     Collection site RIGHT BRACHIAL     Drawn by [id number]     Sample type ARTERIAL     Allens  test (pass/fail) PASS  PASS  URINALYSIS, ROUTINE W REFLEX MICROSCOPIC     Status: Abnormal   Collection Time    10/02/2013  6:19 PM      Result Value Ref Range   Color, Urine AMBER (*) YELLOW   Comment: BIOCHEMICALS MAY BE AFFECTED BY COLOR   APPearance CLOUDY (*) CLEAR   Specific Gravity, Urine 1.030  1.005 - 1.030   pH 5.0  5.0 - 8.0   Glucose, UA NEGATIVE  NEGATIVE mg/dL   Hgb urine dipstick SMALL (*) NEGATIVE   Bilirubin Urine SMALL (*) NEGATIVE   Ketones, ur 15 (*) NEGATIVE mg/dL   Protein, ur 30 (*) NEGATIVE mg/dL   Urobilinogen, UA 2.0 (*) 0.0 - 1.0 mg/dL   Nitrite NEGATIVE  NEGATIVE   Leukocytes, UA SMALL (*) NEGATIVE  MRSA PCR SCREENING     Status: None   Collection Time    10/24/2013  6:19 PM      Result Value Ref Range   MRSA by PCR NEGATIVE  NEGATIVE   Comment:            The GeneXpert MRSA Assay (FDA     approved for NASAL specimens     only), is one component of a      comprehensive MRSA colonization     surveillance program. It is not     intended to diagnose MRSA     infection nor to guide or     monitor treatment for     MRSA infections.  URINE MICROSCOPIC-ADD ON     Status: Abnormal   Collection Time    10/21/2013  6:19 PM      Result Value Ref Range   Squamous Epithelial / LPF FEW (*) RARE   WBC, UA 0-2  <3 WBC/hpf   RBC / HPF 3-6  <3 RBC/hpf   Bacteria, UA RARE  RARE   Urine-Other AMORPHOUS URATES/PHOSPHATES    CULTURE, BLOOD (ROUTINE X 2)     Status: None   Collection Time    10/10/2013  7:55 PM      Result Value Ref Range   Specimen Description BLOOD LEFT HAND     Special Requests BOTTLES DRAWN AEROBIC ONLY 1CC     Culture  Setup Time       Value: 10/27/2013 23:16     Performed at [facility]   Culture       Value:        BLOOD CULTURE RECEIVED NO GROWTH TO DATE CULTURE WILL BE HELD FOR 5 DAYS BEFORE ISSUING A FINAL NEGATIVE REPORT     Performed at [facility]   Report Status PENDING    CORTISOL     Status: None   Collection Time    10/12/2013  7:55 PM      Result Value Ref Range   Cortisol, Plasma 51.7     Comment: (NOTE)     AM:  4.3 - 22.4 ug/dL     PM:  3.1 - 16.7 ug/dL     Performed at [facility] ACID, PLASMA     Status: Abnormal   Collection Time    10/25/2013  7:55 PM      Result Value Ref Range   Lactic Acid, Venous 5.9 (*) 0.5 - 2.2 mmol/L  PROCALCITONIN     Status: None   Collection Time    10/04/2013  7:55 PM      Result Value Ref Range   Procalcitonin  20.00     Comment:            Interpretation:     PCT >= 10 ng/mL:     Important systemic inflammatory response,     almost exclusively due to severe bacterial     sepsis or septic shock.     (NOTE)             ICU PCT Algorithm               Non ICU PCT Algorithm        ----------------------------     ------------------------------             PCT < 0.25 ng/mL                 PCT < 0.1 ng/mL         Stopping of antibiotics             Stopping of antibiotics           strongly encouraged.               strongly encouraged.        ----------------------------     ------------------------------           PCT level decrease by               PCT < 0.25 ng/mL           >= 80% from peak PCT           OR PCT 0.25 - 0.5 ng/mL          Stopping of antibiotics                                                 encouraged.         Stopping of antibiotics               encouraged.        ----------------------------     ------------------------------           PCT level decrease by              PCT >= 0.25 ng/mL           < 80% from peak PCT            AND PCT >= 0.5 ng/mL            Continuing antibiotics                                                  encouraged.           Continuing antibiotics                encouraged.        ----------------------------     ------------------------------         PCT level increase compared          PCT > 0.5 ng/mL             with peak PCT AND              PCT >= 0.5 ng/mL  Escalation of antibiotics                                              strongly encouraged.          Escalation of antibiotics            strongly encouraged.  GLUCOSE, CAPILLARY     Status: None   Collection Time    10/23/2013  7:57 PM      Result Value Ref Range   Glucose-Capillary 85  70 - 99 mg/dL  CULTURE, BLOOD (ROUTINE X 2)     Status: None   Collection Time    10/03/2013  8:13 PM      Result Value Ref Range   Specimen Description BLOOD LEFT FOREARM     Special Requests BOTTLES DRAWN AEROBIC ONLY 3CC     Culture  Setup Time       Value: 10/27/2013 23:16     Performed at [facility]   Culture       Value:        BLOOD CULTURE RECEIVED NO GROWTH TO DATE CULTURE WILL BE HELD FOR 5 DAYS BEFORE ISSUING A FINAL NEGATIVE REPORT     Performed at [facility]   Report Status PENDING    APTT     Status: Abnormal   Collection Time    10/29/2013  8:55 PM      Result Value Ref Range    aPTT 48 (*) 24 - 37 seconds   Comment:            IF BASELINE aPTT IS ELEVATED,     SUGGEST PATIENT RISK ASSESSMENT     BE USED TO DETERMINE APPROPRIATE     ANTICOAGULANT THERAPY.  PROTIME-INR     Status: Abnormal   Collection Time    10/02/2013  8:55 PM      Result Value Ref Range   Prothrombin Time 18.0 (*) 11.6 - 15.2 seconds   INR 1.49  0.00 - 1.49  GLUCOSE, CAPILLARY     Status: None   Collection Time    10/11/2013 11:39 PM      Result Value Ref Range   Glucose-Capillary 98  70 - 99 mg/dL  POCT I-STAT 3, ART BLOOD GAS (G3+)     Status: Abnormal   Collection Time    10/04/2013 11:40 PM      Result Value Ref Range   pH, Arterial 7.182 (*) 7.350 - 7.450   pCO2 arterial 44.1  35.0 - 45.0 mmHg   pO2, Arterial 82.0  80.0 - 100.0 mmHg   Bicarbonate 16.1 (*) 20.0 - 24.0 mEq/L   TCO2 17  0 - 100 mmol/L   O2 Saturation 90.0     Acid-base deficit 11.0 (*) 0.0 - 2.0 mmol/L   Patient temperature 102.7 F     Collection site RADIAL, ALLEN'S TEST ACCEPTABLE     Drawn by RT     Sample type ARTERIAL     Comment NOTIFIED PHYSICIAN    CARBOXYHEMOGLOBIN     Status: None   Collection Time    10/29/13 12:00 AM      Result Value Ref Range   Total hemoglobin 13.0  12.0 - 16.0 g/dL   O2 Saturation 84.2     Carboxyhemoglobin 0.5  0.5 - 1.5 %   Methemoglobin 0.9  0.0 -  1.5 %  BLOOD GAS, ARTERIAL     Status: Abnormal   Collection Time    10/29/13 12:15 AM      Result Value Ref Range   FIO2 1.00     Delivery systems VENTILATOR     Mode PRESSURE REGULATED VOLUME CONTROL     VT 500     Rate 25.0     Peep/cpap 5.0     pH, Arterial 7.229 (*) 7.350 - 7.450   pCO2 arterial 40.5  35.0 - 45.0 mmHg   pO2, Arterial 132.0 (*) 80.0 - 100.0 mmHg   Bicarbonate 15.9 (*) 20.0 - 24.0 mEq/L   TCO2 17.1  0 - 100 mmol/L   Acid-base deficit 9.9 (*) 0.0 - 2.0 mmol/L   O2 Saturation 97.7     Patient temperature 101.4     Collection site RIGHT RADIAL     Drawn by [id number]     Sample type ARTERIAL DRAW      Allens test (pass/fail) PASS  PASS  LACTIC ACID, PLASMA     Status: Abnormal   Collection Time    10/29/13 12:20 AM      Result Value Ref Range   Lactic Acid, Venous 3.9 (*) 0.5 - 2.2 mmol/L  BLOOD GAS, ARTERIAL     Status: Abnormal   Collection Time    10/29/13  4:00 AM      Result Value Ref Range   FIO2 60.00     Delivery systems VENTILATOR     Mode PRESSURE REGULATED VOLUME CONTROL     VT 550     Rate 25     Peep/cpap 5.0     pH, Arterial 7.275 (*) 7.350 - 7.450   pCO2 arterial 32.3 (*) 35.0 - 45.0 mmHg   pO2, Arterial 138.0 (*) 80.0 - 100.0 mmHg   Bicarbonate 14.3 (*) 20.0 - 24.0 mEq/L   TCO2 15.2  0 - 100 mmol/L   Acid-base deficit 11.0 (*) 0.0 - 2.0 mmol/L   O2 Saturation 98.3     Patient temperature 100.8     Collection site RIGHT RADIAL     Drawn by [phone]     Sample type ARTERIAL DRAW     Allens test (pass/fail) PASS  PASS  GLUCOSE, CAPILLARY     Status: None   Collection Time    10/29/13  4:04 AM      Result Value Ref Range   Glucose-Capillary 92  70 - 99 mg/dL  COMPREHENSIVE METABOLIC PANEL     Status: Abnormal   Collection Time    10/29/13  5:00 AM      Result Value Ref Range   Sodium 137  137 - 147 mEq/L   Potassium 4.0  3.7 - 5.3 mEq/L   Chloride 101  96 - 112 mEq/L   CO2 14 (*) 19 - 32 mEq/L   Glucose, Bld 116 (*) 70 - 99 mg/dL   BUN 41 (*) 6 - 23 mg/dL   Creatinine, Ser 1.53 (*) 0.50 - 1.10 mg/dL   Calcium 6.7 (*) 8.4 - 10.5 mg/dL   Total Protein 5.6 (*) 6.0 - 8.3 g/dL   Albumin 2.4 (*) 3.5 - 5.2 g/dL   AST 264 (*) 0 - 37 U/L   ALT 101 (*) 0 - 35 U/L   Alkaline Phosphatase 84  39 - 117 U/L   Total Bilirubin 1.5 (*) 0.3 - 1.2 mg/dL   GFR calc non Af Amer 34 (*) >90 mL/min   GFR calc  Af Amer 40 (*) >90 mL/min   Comment: (NOTE)     The eGFR has been calculated using the CKD EPI equation.     This calculation has not been validated in all clinical situations.     eGFR's persistently <90 mL/min signify possible Chronic Kidney     Disease.  CBC      Status: Abnormal   Collection Time    10/29/13  5:00 AM      Result Value Ref Range   WBC 7.9  4.0 - 10.5 K/uL   Comment: REPEATED TO VERIFY   RBC 4.29  3.87 - 5.11 MIL/uL   Hemoglobin 12.8  12.0 - 15.0 g/dL   HCT 36.8  36.0 - 46.0 %   MCV 85.8  78.0 - 100.0 fL   MCH 29.8  26.0 - 34.0 pg   MCHC 34.8  30.0 - 36.0 g/dL   RDW 14.8  11.5 - 15.5 %   Platelets 43 (*) 150 - 400 K/uL   Comment: PLATELET COUNT CONFIRMED BY SMEAR  MAGNESIUM     Status: None   Collection Time    10/29/13  5:00 AM      Result Value Ref Range   Magnesium 2.0  1.5 - 2.5 mg/dL  PROCALCITONIN     Status: None   Collection Time    10/29/13  5:00 AM      Result Value Ref Range   Procalcitonin 23.98     Comment:            Interpretation:     PCT >= 10 ng/mL:     Important systemic inflammatory response,     almost exclusively due to severe bacterial     sepsis or septic shock.     (NOTE)             ICU PCT Algorithm               Non ICU PCT Algorithm        ----------------------------     ------------------------------             PCT < 0.25 ng/mL                 PCT < 0.1 ng/mL         Stopping of antibiotics            Stopping of antibiotics           strongly encouraged.               strongly encouraged.        ----------------------------     ------------------------------           PCT level decrease by               PCT < 0.25 ng/mL           >= 80% from peak PCT           OR PCT 0.25 - 0.5 ng/mL          Stopping of antibiotics                                                 encouraged.         Stopping of antibiotics  encouraged.        ----------------------------     ------------------------------           PCT level decrease by              PCT >= 0.25 ng/mL           < 80% from peak PCT            AND PCT >= 0.5 ng/mL            Continuing antibiotics                                                  encouraged.           Continuing antibiotics                encouraged.          ----------------------------     ------------------------------         PCT level increase compared          PCT > 0.5 ng/mL             with peak PCT AND              PCT >= 0.5 ng/mL             Escalation of antibiotics                                              strongly encouraged.          Escalation of antibiotics            strongly encouraged.  PHOSPHORUS     Status: None   Collection Time    10/29/13  5:00 AM      Result Value Ref Range   Phosphorus 2.3  2.3 - 4.6 mg/dL  LACTIC ACID, PLASMA     Status: Abnormal   Collection Time    10/29/13  5:56 AM      Result Value Ref Range   Lactic Acid, Venous 4.0 (*) 0.5 - 2.2 mmol/L  GLUCOSE, CAPILLARY     Status: Abnormal   Collection Time    10/29/13  8:16 AM      Result Value Ref Range   Glucose-Capillary 100 (*) 70 - 99 mg/dL  LACTATE DEHYDROGENASE     Status: Abnormal   Collection Time    10/29/13 11:10 AM      Result Value Ref Range   LDH 601 (*) 94 - 250 U/L  AMYLASE     Status: None   Collection Time    10/29/13 11:10 AM      Result Value Ref Range   Amylase 83  0 - 105 U/L  MAGNESIUM     Status: None   Collection Time    10/29/13 11:10 AM      Result Value Ref Range   Magnesium 2.0  1.5 - 2.5 mg/dL  PHOSPHORUS     Status: None   Collection Time    10/29/13 11:10 AM      Result Value Ref Range   Phosphorus 2.5  2.3 - 4.6 mg/dL  CBC WITH DIFFERENTIAL     Status: Abnormal  Collection Time    10/29/13 11:10 AM      Result Value Ref Range   WBC 7.8  4.0 - 10.5 K/uL   RBC 4.26  3.87 - 5.11 MIL/uL   Hemoglobin 12.6  12.0 - 15.0 g/dL   HCT 37.4  36.0 - 46.0 %   MCV 87.8  78.0 - 100.0 fL   MCH 29.6  26.0 - 34.0 pg   MCHC 33.7  30.0 - 36.0 g/dL   RDW 15.3  11.5 - 15.5 %   Platelets 42 (*) 150 - 400 K/uL   Comment: PLATELET COUNT CONFIRMED BY SMEAR   Neutrophils Relative % 88 (*) 43 - 77 %   Lymphocytes Relative 7 (*) 12 - 46 %   Monocytes Relative 4  3 - 12 %   Eosinophils Relative 0  0 - 5 %    Basophils Relative 1  0 - 1 %   Neutro Abs 6.9  1.7 - 7.7 K/uL   Lymphs Abs 0.5 (*) 0.7 - 4.0 K/uL   Monocytes Absolute 0.3  0.1 - 1.0 K/uL   Eosinophils Absolute 0.0  0.0 - 0.7 K/uL   Basophils Absolute 0.1  0.0 - 0.1 K/uL   WBC Morphology DOHLE BODIES     Comment: ATYPICAL LYMPHOCYTES  STREP PNEUMONIAE URINARY ANTIGEN     Status: None   Collection Time    10/29/13 11:45 AM      Result Value Ref Range   Strep Pneumo Urinary Antigen NEGATIVE  NEGATIVE   Comment:            Infection due to S. pneumoniae     cannot be absolutely ruled out     since the antigen present     may be below the detection limit     of the test.  GLUCOSE, CAPILLARY     Status: None   Collection Time    10/29/13 12:33 PM      Result Value Ref Range   Glucose-Capillary 99  70 - 99 mg/dL    MICRO: 6/29 blood cx x 4 6/29 urine cx 6/29 RVP 6/28 urine cx IMAGING: US Renal  10/29/2013   CLINICAL DATA:  Urinary tract infection and septic stock. Rule out hydronephrosis.  EXAM: RENAL/URINARY TRACT ULTRASOUND COMPLETE  COMPARISON:  None.  FINDINGS: Right Kidney:  Length: 12 cm. Echogenicity within normal limits. No mass or hydronephrosis visualized.  Left Kidney:  Length: 12 cm. Echogenicity within normal limits. No mass or hydronephrosis visualized.  Bladder:  Not visualized; reportedly there is a Foley catheter present.  There is increased echogenicity of the imaged liver, obscuring the portal triads.  IMPRESSION: 1. Negative for hydronephrosis. 2. Hepatic steatosis.   Electronically Signed   By: Jorje Guild M.D.   On: 10/29/2013 05:37   Portable Chest Xray  10/23/2013   CLINICAL DATA:  Status post ET tube placement  EXAM: PORTABLE CHEST - 1 VIEW  COMPARISON:  10/09/2013  FINDINGS: ET tube tip is above the carina. There is a nasogastric tube tip in the stomach. The right IJ catheter tip is in the SVC. Normal heart size. Atelectasis is identified within the left base.  IMPRESSION: 1. Satisfactory position of the  ET tube with tip above the carina.   Electronically Signed   By: Kerby Moors M.D.   On: 10/09/2013 23:44   Dg Chest Port 1 View  10/27/2013   CLINICAL DATA:  Shortness of breath  EXAM: PORTABLE CHEST - 1 VIEW  COMPARISON:  PA and lateral chest of October 27, 2013  FINDINGS: The lungs are slightly less well inflated but positioning has changed. The interstitial markings are slightly increased in prominence. The cardiac silhouette remains top-normal in size. The central pulmonary vascularity is prominent. There is no pleural effusion. The bony thorax is unremarkable.  IMPRESSION: Mild interstitial edema is present and more conspicuous today. This may be related to underlying low-grade CHF.   Electronically Signed   By: David  [place]   On: 10/02/2013 12:09   Dg Abd Portable 1v  10/27/2013   CLINICAL DATA:  Status post OG tube placement  EXAM: PORTABLE ABDOMEN - 1 VIEW  COMPARISON:  None  FINDINGS: The enteric tube tip is in the distal stomach. The side port is well below the GE junction. No dilated bowel loops. Cholecystectomy clips noted.  IMPRESSION: Tip of the enteric tube is in the distal stomach.   Electronically Signed   By: Kerby Moors M.D.   On: 10/10/2013 23:45    Assessment/Plan: 68 y.o. female presents with sepsis and Acute respiratory distress requiring intubation in the setting of pulmonary symptoms reports having history of tick bites in the last 7-10 days. Lab reveal thrombocytopenia, mildly elevated transaminitis, physical exam has lacy rash. Currently on vancomycin, piptazo and doxycycline. Agree with primary team that sepsis is likely due to pulmonary process. She may  possibly have 2nd process of tickborne illness due to fever, HA, and joint pain. STARI is usually milder disease than lyme disease, thus would not explain the severity of illness that she currently has. The lone star tick is also vector for erhlichiosis. transaminitis maybe explained by hypotension. Thrombocytopenia by DIC.  Her initial hyponatremia noted in ED on 5/28 corrected prior to admit.   - RMSF, erhlichia, lyme WB are pending. There are no diagnostics approved presently for STARI ([place] tick associated rash illness) but it is often treated with doxycycline due to resemblance to early lyme disease. - continue with current antibiotic regimen - await culture results to determine how to narrow antibiotics - please send specimen for respiratory culture  Caren Griffins B. [place] for Infectious Diseases [phone]

## 2013-10-29 NOTE — Procedures (Signed)
Arterial Catheter Insertion Procedure Note Tracie JasperBrenda B [name] [id number] 03/26/1946  Procedure: Insertion of Arterial Catheter  Indications: Blood pressure monitoring and Frequent blood sampling  Procedure Details Consent: Unable to obtain consent because of altered level of consciousness. Time Out: Verified patient identification, verified procedure, site/side was marked, verified correct patient position, special equipment/implants available, medications/allergies/relevent history reviewed, required imaging and test results available.  Performed  Maximum sterile technique was used including antiseptics, cap, gloves, gown, hand hygiene, mask and sheet. Skin prep: Chlorhexidine; local anesthetic administered 24 gauge catheter was inserted into right femoral artery using the Seldinger technique.  Evaluation Blood flow good; BP tracing good. Complications: No apparent complications.  Procedure performed under direct ultrasound guidance for real time vessel cannulation.      Tracie Guysahul Desai, PA - [name] Pgr: [phone]  or [phone]   Radial poor by US  [name]  Tracie [name] AliasFeinstein, MD, FACP Pgr: [phone] Tracie [name]'

## 2013-10-29 NOTE — Progress Notes (Signed)
INITIAL NUTRITION ASSESSMENT  DOCUMENTATION CODES Per approved criteria  -Obesity Unspecified   INTERVENTION:  Utilize 28M PEPuP Protocol: initiate TF via OGT with Vital High Protein at 25 ml/h on day 1; on day 2, increase to goal rate of 65 ml/h (1560 ml per day) to provide 1560 kcals (24.5 kcals/kg ideal weight), 137 gm protein, 1304 ml free water daily.  NUTRITION DIAGNOSIS: Inadequate oral intake related to inability to eat as evidenced by NPO status.   Goal: Enteral nutrition to provide 60-70% of estimated calorie needs (22-25 kcals/kg ideal body weight) and 100% of estimated protein needs, based on ASPEN guidelines for permissive underfeeding in critically ill obese individuals  Monitor:  TF tolerance/adequacy, weight trend, labs, vent status.  Reason for Assessment: MD Consult for TF initiation and management.  68 y.o. female  Admitting Dx: Sepsis  ASSESSMENT: 68 y.o. F transferred from [facility] on 6/29 with septic shock, unclear source.  Nutrition focused physical exam completed.  No muscle or subcutaneous fat depletion noticed.  Patient is currently intubated on ventilator support MV: 15.2 L/min Temp (24hrs), Avg:102.2 F (39 C), Min:98.9 F (37.2 C), Max:105.2 F (40.7 C)   Height: Ht Readings from Last 1 Encounters:  10/12/2013 5\' 8"  (1.727 m)    Weight: Wt Readings from Last 1 Encounters:  10/29/13 223 lb 1.7 oz (101.2 kg)    Ideal Body Weight: 63.6 kg  % Ideal Body Weight: 159%  Wt Readings from Last 10 Encounters:  10/29/13 223 lb 1.7 oz (101.2 kg)  10/27/13 220 lb (99.791 kg)  01/07/09 220 lb (99.791 kg)    Usual Body Weight: 220 lb  % Usual Body Weight: 101%  BMI:  Body mass index is 33.93 kg/(m^2).  Estimated Nutritional Needs: Kcal: 2487 Protein: >/= 127 gm Fluid: 2.2-2.4 L  Skin: WDL  Diet Order: NPO  EDUCATION NEEDS: -Education not appropriate at this time   Intake/Output Summary (Last 24 hours) at 10/29/13 1510 Last data  filed at 10/29/13 1400  Gross per 24 hour  Intake 3131.62 ml  Output    995 ml  Net 2136.62 ml    Last BM: PTA   Labs:   Recent Labs Lab 10/27/13 0748 10/12/2013 1140 10/29/13 0500 10/29/13 1110  NA 130* 135* 137  --   K 3.5* 3.7 4.0  --   CL 91* 97 101  --   CO2 20 20 14*  --   BUN 33* 34* 41*  --   CREATININE 1.77* 1.48* 1.53*  --   CALCIUM 8.4 7.6* 6.7*  --   MG  --   --  2.0 2.0  PHOS  --   --  2.3 2.5  GLUCOSE 155* 109* 116*  --     CBG (last 3)   Recent Labs  10/29/13 0404 10/29/13 0816 10/29/13 1233  GLUCAP 92 100* 99    Scheduled Meds: . antiseptic oral rinse  15 mL Mouth Rinse QID  . chlorhexidine  15 mL Mouth Rinse BID  . dextrose      . doxycycline (VIBRAMYCIN) IV  100 mg Intravenous Q12H  . feeding supplement (PRO-STAT SUGAR FREE 64)  30 mL Per Tube BID  . feeding supplement (VITAL HIGH PROTEIN)  1,000 mL Per Tube Q24H  . insulin aspart  0-9 Units Subcutaneous 6 times per day  . levETIRAcetam  500 mg Per Tube BID  . pantoprazole (PROTONIX) IV  40 mg Intravenous Q24H  . piperacillin-tazobactam (ZOSYN)  IV  3.375 g Intravenous 3 times  per day  . vancomycin  750 mg Intravenous Q12H    Continuous Infusions: . sodium chloride 125 mL/hr at 10/29/13 0810  . fentaNYL infusion INTRAVENOUS 100 mcg/hr (10/29/13 1400)  . norepinephrine (LEVOPHED) Adult infusion 10 mcg/min (10/29/13 1400)    Past Medical History  Diagnosis Date  . Sleep apnea     somewhat compliant with CPAP  . CVA (cerebral infarction) 1970  . Seizures   . Gout   . Hyperlipidemia   . Vertigo     Past Surgical History  Procedure Laterality Date  . Cholecystectomy    . Knee arthroscopy      Right    Joaquin CourtsKimberly Harris, RD, LDN, CNSC Pager [phone] After Hours Pager [phone]

## 2013-10-29 NOTE — Progress Notes (Signed)
UR Completed.  [name], Tracie Jane [phone] 10/29/2013  

## 2013-10-29 NOTE — H&P (Addendum)
PULMONARY / CRITICAL CARE MEDICINE   Name: Particia JasperBrenda B Glazier MRN: [id number] DOB: 11/21/1945    ADMISSION DATE:  10/04/2013 CONSULTATION DATE:  6/39/2015  REFERRING MD :  EDP PRIMARY SERVICE: PCCM  CHIEF COMPLAINT:  SOB  BRIEF PATIENT DESCRIPTION: 68 y.o. F transferred from [facility] with septic shock, unclear source.  SIGNIFICANT EVENTS / STUDIES:  6/28 - presented to ED with SOB, treated w rocephin for UTI and sent home with levaquin for bronchitis. 6/29 - back to ED in respiratory distress, hypotensive, lactic acidosis, fever to 104.  Transferred to [facility] ICU. 6/30 renal US>>>. Negative for hydronephrosis.  Hepatic steatosis  LINES / TUBES: Foley 6/29 >>> 6/29 ETT>>> 6/29 rt S[place]y>>>  CULTURES: Blood 6/29 >>> Urine 6/29 >>> Respiratory 6/29  >>> RVP 6/29 >>>  ANTIBIOTICS: Zosyn 6/29 >>> Vancomycin 6/29 >>>  SUBJECTIVE:  Fever 102.4, borderline BP  VITAL SIGNS: Temp:  [100.4 F (38 C)-105.2 F (40.7 C)] 102.4 F (39.1 C) (06/30 0930) Pulse Rate:  [93-123] 114 (06/30 0930) Resp:  [17-36] 19 (06/30 0930) BP: (76-174)/(50-144) 91/76 mmHg (06/30 0930) SpO2:  [91 %-100 %] 100 % (06/30 0930) FiO2 (%):  [40 %-100 %] 50 % (06/30 0820) Weight:  [100.8 kg (222 lb 3.6 oz)-101.2 kg (223 lb 1.7 oz)] 101.2 kg (223 lb 1.7 oz) (06/30 0419) HEMODYNAMICS: CVP:  [10 mmHg-12 mmHg] 12 mmHg VENTILATOR SETTINGS: Vent Mode:  [-] PRVC FiO2 (%):  [40 %-100 %] 50 % Set Rate:  [10 bmp-28 bmp] 25 bmp Vt Set:  [500 mL-550 mL] 550 mL PEEP:  [5 cmH20-10 cmH20] 5 cmH20 Plateau Pressure:  [17 cmH20-26 cmH20] 26 cmH20 INTAKE / OUTPUT: Intake/Output     06/29 0701 - 06/30 0700 06/30 0701 - 07/01 0700   P.O. 16    I.V. (mL/kg) 1701.4 (16.8) 130 (1.3)   IV Piggyback 100    Total Intake(mL/kg) 1817.4 (18) 130 (1.3)   Urine (mL/kg/hr) 920 75 (0.3)   Total Output 920 75   Net +897.4 +55          PHYSICAL EXAMINATION: General: Elderly appearing female, on vent Neuro: rass -2, moves all ext equal,  follows commands HEENT: ett, jvd wnl Cardiovascular: RRR, no M/R/G.  Lungs: CTA Abdomen: BS x 4, soft, NT/ND.  Musculoskeletal: No gross deformities, no edema. Peripheral pulses +   LABS:  CBC  Recent Labs Lab 10/27/13 0748 10/10/2013 1130 10/01/2013 1140 10/29/13 0500  WBC 5.9  --  7.1 7.9  HGB 14.3  --  13.6 12.8  HCT 40.2  --  38.8 36.8  PLT 45* 52* 52* 43*   Coag's  Recent Labs Lab 10/09/2013 1130 10/24/2013 2055  APTT 43* 48*  INR 1.34 1.49   BMET  Recent Labs Lab 10/27/13 0748 10/15/2013 1140 10/29/13 0500  NA 130* 135* 137  K 3.5* 3.7 4.0  CL 91* 97 101  CO2 20 20 14*  BUN 33* 34* 41*  CREATININE 1.77* 1.48* 1.53*  GLUCOSE 155* 109* 116*   Electrolytes  Recent Labs Lab 10/27/13 0748 10/19/2013 1140 10/29/13 0500  CALCIUM 8.4 7.6* 6.7*  MG  --   --  2.0  PHOS  --   --  2.3   Sepsis Markers  Recent Labs Lab 10/11/2013 1218 10/26/2013 1955 10/29/13 0020 10/29/13 0500 10/29/13 0556  LATICACIDVEN 5.1* 5.9* 3.9*  --  4.0*  PROCALCITON  --  20.00  --  23.98  --    ABG  Recent Labs Lab 10/02/2013 2340 10/29/13 0015 10/29/13  0400  PHART 7.182* 7.229* 7.275*  PCO2ART 44.1 40.5 32.3*  PO2ART 82.0 132.0* 138.0*   Liver Enzymes  Recent Labs Lab January 26, 2014 1140 10/29/13 0500  AST 238* 264*  ALT 116* 101*  ALKPHOS 88 84  BILITOT 1.4* 1.5*  ALBUMIN 2.9* 2.4*   Cardiac Enzymes  Recent Labs Lab January 26, 2014 1140  TROPONINI <0.30  PROBNP 2537.0*   Glucose  Recent Labs Lab January 26, 2014 1957 January 26, 2014 2339 10/29/13 0404 10/29/13 0816  GLUCAP 85 98 92 100*    Imaging Dg Chest 2 View  10/27/2013   CLINICAL DATA:  Fever  EXAM: CHEST  2 VIEW  COMPARISON:  10/19/2013  FINDINGS: Cardiomediastinal silhouette is stable. No acute infiltrate or pleural effusion. No pulmonary edema. Mild infrahilar bronchitic changes without focal consolidation. Degenerative changes thoracic spine.  IMPRESSION: No acute infiltrate or pulmonary edema. Mild infrahilar  bronchitic changes. Degenerative changes thoracic spine.   Electronically Signed   By: Natasha MeadLiviu  Pop M.D.   On: 10/27/2013 10:17   [name] Renal  10/29/2013   CLINICAL DATA:  Urinary tract infection and septic stock. Rule out hydronephrosis.  EXAM: RENAL/URINARY TRACT ULTRASOUND COMPLETE  COMPARISON:  None.  FINDINGS: Right Kidney:  Length: 12 cm. Echogenicity within normal limits. No mass or hydronephrosis visualized.  Left Kidney:  Length: 12 cm. Echogenicity within normal limits. No mass or hydronephrosis visualized.  Bladder:  Not visualized; reportedly there is a Foley catheter present.  There is increased echogenicity of the imaged liver, obscuring the portal triads.  IMPRESSION: 1. Negative for hydronephrosis. 2. Hepatic steatosis.   Electronically Signed   By: Tiburcio PeaJonathan  Watts M.D.   On: 10/29/2013 05:37   Portable Chest Xray  06-02-13   CLINICAL DATA:  Status post ET tube placement  EXAM: PORTABLE CHEST - 1 VIEW  COMPARISON:  002-01-15  FINDINGS: ET tube tip is above the carina. There is a nasogastric tube tip in the stomach. The right IJ catheter tip is in the SVC. Normal heart size. Atelectasis is identified within the left base.  IMPRESSION: 1. Satisfactory position of the ET tube with tip above the carina.   Electronically Signed   By: Signa Kellaylor  Stroud M.D.   On: 002-01-15 23:44   Dg Chest Port 1 View  06-02-13   CLINICAL DATA:  Shortness of breath  EXAM: PORTABLE CHEST - 1 VIEW  COMPARISON:  PA and lateral chest of October 27, 2013  FINDINGS: The lungs are slightly less well inflated but positioning has changed. The interstitial markings are slightly increased in prominence. The cardiac silhouette remains top-normal in size. The central pulmonary vascularity is prominent. There is no pleural effusion. The bony thorax is unremarkable.  IMPRESSION: Mild interstitial edema is present and more conspicuous today. This may be related to underlying low-grade CHF.   Electronically Signed   By: David  Swazil[place]    On: 002-01-15 12:09   Dg Abd Portable 1v  06-02-13   CLINICAL DATA:  Status post OG tube placement  EXAM: PORTABLE ABDOMEN - 1 VIEW  COMPARISON:  None  FINDINGS: The enteric tube tip is in the distal stomach. The side port is well below the GE junction. No dilated bowel loops. Cholecystectomy clips noted.  IMPRESSION: Tip of the enteric tube is in the distal stomach.   Electronically Signed   By: Signa Kellaylor  Stroud M.D.   On: 002-01-15 23:45     ASSESSMENT / PLAN:  PULMONARY A: Acute Respiratory Failure Compensated respiratory alkalosis OSA R/o developing PNA P:   -  ABg reviewed, likely to 40% peep 5 -rate 30, abg to follow, I will place a line -pcxr now to assess new infiltrates -doppler legs awaited, d dimer high, pcxr not explain resp failure - CT angio if able, see renal -unable empiric heparin with plat  CARDIOVASCULAR A:  Septic Shock -lactate 5 Elevated BNP P:  - goal MAP 60-65, add levophed and egdt to ensure resusciation - Trend lactate some improvements - Check cortisol 51, no role steroids - Echo awaited, assess rv  RENAL A:   AG metabolic acidosis - lactate. AKI - SCr improved from 6/28. P:   -cvp 10, saline to continue -chem in am -no role bicarb  GASTROINTESTINAL A:   Nutrition Transaminitis - AST 2x ALT, ? ETOH (also note hypoalbuminemia) - sepsis likley P:   - start TF -ppi -lft to follow -assess amy, lip, ldh  HEMATOLOGIC A:  Thrombocytopenia - favor some degree of DIC P:  - VTE Proph:  SCD's only. - lamictal held for lowplat -no hep as of now -cbc in am -dopplers legs, rv echo  INFECTIOUS A:   Septic Shock Concern PNA? UTI - resolved (treated 6/28). DIC association? Tick bite 10 days ago, r/o tick born illness P:   - PCT Algorithm, noted rising and BP lower -consider change zosyn to Imipenem if worsen -likely should have some atypical coverage, add doxy -early bronch to consider for pathogen, differential -no hydro -assess  leg, strep -assess RMSF, Lyme,  May need ID help Assess per smear for intrac orgm, assess erlich abb  ENDOCRINE A:   No evidence AI P:   - Monitor glucose on BMP -add ssi  NEUROLOGIC A:   Hx of Seizures Acute encephalopathy P:   - Hold outpatient Lamictal (see DERM section below). - Keppra or other anticonvulsant -lamictal held for low plat - fent with WUA  DERMATOLOGY A: Oral lesions- r/o early SJS R/o tic born illness  P: - No Bactrim, hold Lamictal. - Supportive care. - doxy, see ID  Summary - I have concerns tick born illness, see ID, consider bronch, await pcxr, concern low plat, dic from tickborne illness? Increase MV vent  I have personally obtained a history, examined the patient, evaluated laboratory and imaging results, formulated the assessment and plan and placed orders. CRITICAL CARE: The patient is critically ill with multiple organ systems failure and requires high complexity decision making for assessment and support, frequent evaluation and titration of therapies, application of advanced monitoring technologies and extensive interpretation of multiple databases. Critical Care Time devoted to patient care services described in this note is 40 minutes.   Mcarthur Rossetti. Tyson Alias, MD, FACP Pgr: [phone] [facility]

## 2013-10-29 NOTE — Progress Notes (Signed)
*  PRELIMINARY RESULTS* Vascular Ultrasound Bilateral lower extremity venous duplex  has been completed.  Preliminary findings: Bilateral:  No evidence of DVT, superficial thrombosis, or Baker's Cyst. Right groin area: enlarged lymph node noted.    [name], Tracie FRANCES 10/29/2013, 12:02 PM

## 2013-10-29 NOTE — Progress Notes (Signed)
  Echocardiogram 2D Echocardiogram has been performed.  Georgian CoWILLIAMS, Montana Bryngelson 10/29/2013, 12:02 PM

## 2013-10-30 ENCOUNTER — Inpatient Hospital Stay (HOSPITAL_COMMUNITY): Payer: Medicare HMO

## 2013-10-30 ENCOUNTER — Encounter (HOSPITAL_COMMUNITY): Payer: Self-pay | Admitting: Radiology

## 2013-10-30 DIAGNOSIS — R6521 Severe sepsis with septic shock: Secondary | ICD-10-CM

## 2013-10-30 DIAGNOSIS — J984 Other disorders of lung: Secondary | ICD-10-CM

## 2013-10-30 DIAGNOSIS — A419 Sepsis, unspecified organism: Secondary | ICD-10-CM

## 2013-10-30 DIAGNOSIS — J9600 Acute respiratory failure, unspecified whether with hypoxia or hypercapnia: Secondary | ICD-10-CM

## 2013-10-30 DIAGNOSIS — R6520 Severe sepsis without septic shock: Secondary | ICD-10-CM

## 2013-10-30 LAB — MAGNESIUM
MAGNESIUM: 2.1 mg/dL (ref 1.5–2.5)
Magnesium: 1.8 mg/dL (ref 1.5–2.5)

## 2013-10-30 LAB — CBC WITH DIFFERENTIAL/PLATELET
BASOS ABS: 0.1 10*3/uL (ref 0.0–0.1)
BASOS PCT: 1 % (ref 0–1)
EOS ABS: 0 10*3/uL (ref 0.0–0.7)
Eosinophils Relative: 0 % (ref 0–5)
HEMATOCRIT: 40.4 % (ref 36.0–46.0)
Hemoglobin: 13.2 g/dL (ref 12.0–15.0)
Lymphocytes Relative: 11 % — ABNORMAL LOW (ref 12–46)
Lymphs Abs: 1.5 10*3/uL (ref 0.7–4.0)
MCH: 29 pg (ref 26.0–34.0)
MCHC: 32.7 g/dL (ref 30.0–36.0)
MCV: 88.8 fL (ref 78.0–100.0)
Monocytes Absolute: 0.6 10*3/uL (ref 0.1–1.0)
Monocytes Relative: 4 % (ref 3–12)
NEUTROS ABS: 11.7 10*3/uL — AB (ref 1.7–7.7)
Neutrophils Relative %: 84 % — ABNORMAL HIGH (ref 43–77)
Platelets: 70 10*3/uL — ABNORMAL LOW (ref 150–400)
RBC: 4.55 MIL/uL (ref 3.87–5.11)
RDW: 15.8 % — AB (ref 11.5–15.5)
WBC: 13.9 10*3/uL — ABNORMAL HIGH (ref 4.0–10.5)

## 2013-10-30 LAB — BLOOD GAS, ARTERIAL
Acid-base deficit: 15.4 mmol/L — ABNORMAL HIGH (ref 0.0–2.0)
Acid-base deficit: 15 mmol/L — ABNORMAL HIGH (ref 0.0–2.0)
BICARBONATE: 11 meq/L — AB (ref 20.0–24.0)
Bicarbonate: 11.2 mEq/L — ABNORMAL LOW (ref 20.0–24.0)
DRAWN BY: 308601
Drawn by: 41934
FIO2: 0.4 %
FIO2: 0.4 %
LHR: 30 {breaths}/min
MECHVT: 550 mL
MECHVT: 550 mL
O2 SAT: 97.4 %
O2 Saturation: 99 %
PATIENT TEMPERATURE: 103.2
PEEP/CPAP: 5 cmH2O
PEEP: 5 cmH2O
PH ART: 7.174 — AB (ref 7.350–7.450)
PO2 ART: 120 mmHg — AB (ref 80.0–100.0)
Patient temperature: 102.3
RATE: 30 resp/min
TCO2: 11.9 mmol/L (ref 0–100)
TCO2: 12.1 mmol/L (ref 0–100)
pCO2 arterial: 32.2 mmHg — ABNORMAL LOW (ref 35.0–45.0)
pCO2 arterial: 32.2 mmHg — ABNORMAL LOW (ref 35.0–45.0)
pH, Arterial: 7.185 — CL (ref 7.350–7.450)
pO2, Arterial: 102 mmHg — ABNORMAL HIGH (ref 80.0–100.0)

## 2013-10-30 LAB — COMPREHENSIVE METABOLIC PANEL
ALBUMIN: 2 g/dL — AB (ref 3.5–5.2)
ALK PHOS: 123 U/L — AB (ref 39–117)
ALT: 83 U/L — ABNORMAL HIGH (ref 0–35)
AST: 255 U/L — AB (ref 0–37)
BUN: 58 mg/dL — ABNORMAL HIGH (ref 6–23)
CO2: 10 mEq/L — CL (ref 19–32)
Calcium: 6 mg/dL — CL (ref 8.4–10.5)
Chloride: 100 mEq/L (ref 96–112)
Creatinine, Ser: 2.52 mg/dL — ABNORMAL HIGH (ref 0.50–1.10)
GFR calc Af Amer: 22 mL/min — ABNORMAL LOW (ref >90)
GFR calc non Af Amer: 19 mL/min — ABNORMAL LOW (ref >90)
Glucose, Bld: 182 mg/dL — ABNORMAL HIGH (ref 70–99)
POTASSIUM: 4 meq/L (ref 3.7–5.3)
SODIUM: 133 meq/L — AB (ref 137–147)
Total Bilirubin: 1.5 mg/dL — ABNORMAL HIGH (ref 0.3–1.2)
Total Protein: 5.1 g/dL — ABNORMAL LOW (ref 6.0–8.3)

## 2013-10-30 LAB — RENAL FUNCTION PANEL
Albumin: 1.7 g/dL — ABNORMAL LOW (ref 3.5–5.2)
Anion gap: 21 — ABNORMAL HIGH (ref 5–15)
BUN: 60 mg/dL — ABNORMAL HIGH (ref 6–23)
CALCIUM: 5.9 mg/dL — AB (ref 8.4–10.5)
CO2: 14 meq/L — AB (ref 19–32)
CREATININE: 2.88 mg/dL — AB (ref 0.50–1.10)
Chloride: 96 mEq/L (ref 96–112)
GFR calc Af Amer: 19 mL/min — ABNORMAL LOW (ref >90)
GFR calc non Af Amer: 16 mL/min — ABNORMAL LOW (ref >90)
Glucose, Bld: 211 mg/dL — ABNORMAL HIGH (ref 70–99)
Phosphorus: 2.4 mg/dL (ref 2.3–4.6)
Potassium: 3.7 mEq/L (ref 3.7–5.3)
Sodium: 131 mEq/L — ABNORMAL LOW (ref 137–147)

## 2013-10-30 LAB — GLUCOSE, CAPILLARY
Glucose-Capillary: 110 mg/dL — ABNORMAL HIGH (ref 70–99)
Glucose-Capillary: 170 mg/dL — ABNORMAL HIGH (ref 70–99)
Glucose-Capillary: 174 mg/dL — ABNORMAL HIGH (ref 70–99)
Glucose-Capillary: 191 mg/dL — ABNORMAL HIGH (ref 70–99)
Glucose-Capillary: 197 mg/dL — ABNORMAL HIGH (ref 70–99)
Glucose-Capillary: 201 mg/dL — ABNORMAL HIGH (ref 70–99)
Glucose-Capillary: 211 mg/dL — ABNORMAL HIGH (ref 70–99)
Glucose-Capillary: 61 mg/dL — ABNORMAL LOW (ref 70–99)
Glucose-Capillary: 67 mg/dL — ABNORMAL LOW (ref 70–99)

## 2013-10-30 LAB — PROTIME-INR
INR: 1.92 — AB (ref 0.00–1.49)
Prothrombin Time: 22 seconds — ABNORMAL HIGH (ref 11.6–15.2)

## 2013-10-30 LAB — LEGIONELLA ANTIGEN, URINE: LEGIONELLA ANTIGEN, URINE: NEGATIVE

## 2013-10-30 LAB — BASIC METABOLIC PANEL
BUN: 60 mg/dL — ABNORMAL HIGH (ref 6–23)
CHLORIDE: 98 meq/L (ref 96–112)
CO2: 14 mEq/L — ABNORMAL LOW (ref 19–32)
Calcium: 6.2 mg/dL — CL (ref 8.4–10.5)
Creatinine, Ser: 2.81 mg/dL — ABNORMAL HIGH (ref 0.50–1.10)
GFR calc non Af Amer: 16 mL/min — ABNORMAL LOW (ref >90)
GFR, EST AFRICAN AMERICAN: 19 mL/min — AB (ref >90)
GLUCOSE: 176 mg/dL — AB (ref 70–99)
POTASSIUM: 3.9 meq/L (ref 3.7–5.3)
Sodium: 135 mEq/L — ABNORMAL LOW (ref 137–147)

## 2013-10-30 LAB — LACTIC ACID, PLASMA: Lactic Acid, Venous: 5.7 mmol/L — ABNORMAL HIGH (ref 0.5–2.2)

## 2013-10-30 LAB — URINALYSIS, ROUTINE W REFLEX MICROSCOPIC
Glucose, UA: 100 mg/dL — AB
Ketones, ur: 15 mg/dL — AB
NITRITE: NEGATIVE
PH: 5 (ref 5.0–8.0)
Protein, ur: 30 mg/dL — AB
Specific Gravity, Urine: 1.03 (ref 1.005–1.030)
Urobilinogen, UA: 1 mg/dL (ref 0.0–1.0)

## 2013-10-30 LAB — URINE MICROSCOPIC-ADD ON

## 2013-10-30 LAB — RESPIRATORY VIRUS PANEL
ADENOVIRUS: NOT DETECTED
Influenza A H1: NOT DETECTED
Influenza A H3: NOT DETECTED
Influenza A: NOT DETECTED
Influenza B: NOT DETECTED
Metapneumovirus: NOT DETECTED
PARAINFLUENZA 1 A: NOT DETECTED
PARAINFLUENZA 3 A: NOT DETECTED
Parainfluenza 2: NOT DETECTED
Respiratory Syncytial Virus A: NOT DETECTED
Respiratory Syncytial Virus B: NOT DETECTED
Rhinovirus: DETECTED — AB

## 2013-10-30 LAB — CARBOXYHEMOGLOBIN
CARBOXYHEMOGLOBIN: 0.6 % (ref 0.5–1.5)
Methemoglobin: 1.1 % (ref 0.0–1.5)
O2 SAT: 85.3 %
TOTAL HEMOGLOBIN: 13.4 g/dL (ref 12.0–16.0)

## 2013-10-30 LAB — B. BURGDORFI ANTIBODIES BY WB
B BURGDORFERI IGM ABS (IB): NEGATIVE
B burgdorferi IgG Abs (IB): NEGATIVE

## 2013-10-30 LAB — PHOSPHORUS
PHOSPHORUS: 2.6 mg/dL (ref 2.3–4.6)
PHOSPHORUS: 2.5 mg/dL (ref 2.3–4.6)

## 2013-10-30 LAB — FIBRINOGEN: Fibrinogen: <60 mg/dL — CL (ref 204–475)

## 2013-10-30 LAB — HIV ANTIBODY (ROUTINE TESTING W REFLEX): HIV: NONREACTIVE

## 2013-10-30 LAB — ROCKY MTN SPOTTED FVR AB, IGM-BLOOD: RMSF IgM: 0.25 IV (ref 0.00–0.89)

## 2013-10-30 LAB — APTT: aPTT: 49 seconds — ABNORMAL HIGH (ref 24–37)

## 2013-10-30 LAB — ABO/RH: ABO/RH(D): A POS

## 2013-10-30 LAB — PROCALCITONIN: PROCALCITONIN: 36.13 ng/mL

## 2013-10-30 MED ORDER — STERILE WATER FOR INJECTION IV SOLN
INTRAVENOUS | Status: DC
  Administered 2013-10-30 – 2013-11-01 (×7): via INTRAVENOUS
  Filled 2013-10-30 (×12): qty 850

## 2013-10-30 MED ORDER — PNEUMOCOCCAL VAC POLYVALENT 25 MCG/0.5ML IJ INJ
0.5000 mL | INJECTION | INTRAMUSCULAR | Status: AC
  Administered 2013-10-31: 0.5 mL via INTRAMUSCULAR
  Filled 2013-10-30: qty 0.5

## 2013-10-30 MED ORDER — SODIUM CHLORIDE 0.9 % FOR CRRT
INTRAVENOUS_CENTRAL | Status: DC | PRN
  Administered 2013-11-04: 13:00:00 via INTRAVENOUS_CENTRAL
  Filled 2013-10-30: qty 1000

## 2013-10-30 MED ORDER — IOHEXOL 300 MG/ML  SOLN
25.0000 mL | INTRAMUSCULAR | Status: AC
  Administered 2013-10-30 (×2): 25 mL via ORAL

## 2013-10-30 MED ORDER — PRISMASOL BGK 4/2.5 32-4-2.5 MEQ/L IV SOLN
INTRAVENOUS | Status: DC
  Administered 2013-10-30 – 2013-11-08 (×50): via INTRAVENOUS_CENTRAL
  Filled 2013-10-30 (×68): qty 5000

## 2013-10-30 MED ORDER — SODIUM CHLORIDE 0.9 % IV SOLN
250.0000 mL | INTRAVENOUS | Status: DC
  Administered 2013-10-30: 09:00:00 via INTRAVENOUS
  Administered 2013-10-31 – 2013-11-01 (×2): 1000 mL via INTRAVENOUS
  Administered 2013-11-02: 250 mL via INTRAVENOUS
  Administered 2013-11-02: 1000 mL via INTRAVENOUS
  Administered 2013-11-03: 10:00:00 via INTRAVENOUS
  Administered 2013-11-04: 1000 mL via INTRAVENOUS

## 2013-10-30 MED ORDER — HEPARIN SODIUM (PORCINE) 1000 UNIT/ML DIALYSIS
1000.0000 [IU] | INTRAMUSCULAR | Status: DC | PRN
  Filled 2013-10-30 (×3): qty 6

## 2013-10-30 MED ORDER — SODIUM CHLORIDE 0.9 % IV SOLN
0.0300 [IU]/min | INTRAVENOUS | Status: DC
  Administered 2013-10-30 – 2013-11-02 (×3): 0.03 [IU]/min via INTRAVENOUS
  Filled 2013-10-30 (×4): qty 2.5

## 2013-10-30 MED ORDER — EPINEPHRINE HCL 1 MG/ML IJ SOLN
0.5000 ug/min | INTRAVENOUS | Status: DC
  Administered 2013-10-30: 5 ug/min via INTRAVENOUS
  Filled 2013-10-30 (×3): qty 4

## 2013-10-30 MED ORDER — SODIUM CHLORIDE 0.9 % IV SOLN
250.0000 mg | Freq: Four times a day (QID) | INTRAVENOUS | Status: DC
  Administered 2013-10-30 – 2013-11-08 (×35): 250 mg via INTRAVENOUS
  Filled 2013-10-30 (×40): qty 250

## 2013-10-30 MED ORDER — SODIUM CHLORIDE 0.9 % IV BOLUS (SEPSIS)
750.0000 mL | Freq: Once | INTRAVENOUS | Status: AC
  Administered 2013-10-30: 750 mL via INTRAVENOUS

## 2013-10-30 MED ORDER — VANCOMYCIN HCL 10 G IV SOLR
1500.0000 mg | INTRAVENOUS | Status: DC
  Administered 2013-10-31: 1500 mg via INTRAVENOUS
  Filled 2013-10-30 (×2): qty 1500

## 2013-10-30 MED ORDER — SODIUM CHLORIDE 0.9 % IV SOLN: 250.0000 mL | INTRAVENOUS | Status: DC | PRN

## 2013-10-30 MED ORDER — PRISMASOL BGK 4/2.5 32-4-2.5 MEQ/L IV SOLN
INTRAVENOUS | Status: DC
  Administered 2013-10-30 – 2013-11-07 (×21): via INTRAVENOUS_CENTRAL
  Filled 2013-10-30 (×22): qty 5000

## 2013-10-30 MED ORDER — EPINEPHRINE HCL 1 MG/ML IJ SOLN
0.5000 ug/min | INTRAVENOUS | Status: DC
  Administered 2013-10-30: 1 ug/min via INTRAVENOUS
  Administered 2013-10-30: 5 ug/min via INTRAVENOUS
  Filled 2013-10-30 (×2): qty 1

## 2013-10-30 MED ORDER — STERILE WATER FOR INJECTION IV SOLN
INTRAVENOUS | Status: DC
  Administered 2013-10-30 – 2013-10-31 (×2): via INTRAVENOUS_CENTRAL
  Filled 2013-10-30 (×7): qty 150

## 2013-10-30 MED ORDER — CALCIUM GLUCONATE 10 % IV SOLN
1.0000 g | Freq: Once | INTRAVENOUS | Status: AC
  Administered 2013-10-30: 1 g via INTRAVENOUS
  Filled 2013-10-30: qty 10

## 2013-10-30 MED ORDER — INSULIN ASPART 100 UNIT/ML ~~LOC~~ SOLN
0.0000 [IU] | SUBCUTANEOUS | Status: DC
  Administered 2013-10-30: 5 [IU] via SUBCUTANEOUS
  Administered 2013-10-30: 2 [IU] via SUBCUTANEOUS
  Administered 2013-10-31: 3 [IU] via SUBCUTANEOUS
  Administered 2013-10-31 (×2): 2 [IU] via SUBCUTANEOUS
  Administered 2013-11-01 – 2013-11-02 (×5): 3 [IU] via SUBCUTANEOUS
  Administered 2013-11-02 (×3): 2 [IU] via SUBCUTANEOUS
  Administered 2013-11-02: 3 [IU] via SUBCUTANEOUS
  Administered 2013-11-02: 2 [IU] via SUBCUTANEOUS
  Administered 2013-11-03 (×3): 3 [IU] via SUBCUTANEOUS
  Administered 2013-11-03: 5 [IU] via SUBCUTANEOUS
  Administered 2013-11-03: 3 [IU] via SUBCUTANEOUS
  Administered 2013-11-04 (×2): 2 [IU] via SUBCUTANEOUS
  Administered 2013-11-04 (×3): 3 [IU] via SUBCUTANEOUS
  Administered 2013-11-04: 2 [IU] via SUBCUTANEOUS
  Administered 2013-11-04: 3 [IU] via SUBCUTANEOUS
  Administered 2013-11-05: 2 [IU] via SUBCUTANEOUS

## 2013-10-30 NOTE — Progress Notes (Signed)
ANTIBIOTIC CONSULT NOTE   Pharmacy Consult for Vancomycin and Zosyn Indication: rule out sepsis  Allergies  Allergen Reactions  . Sulfa Antibiotics Rash    Oral blisters    Patient Measurements: Height: 5\' 8"  (172.7 cm) Weight: 234 lb 12.6 oz (106.5 kg) IBW/kg (Calculated) : 63.9  Vital Signs: Temp: 102.5 F (39.2 C) (07/01 0915) Temp src: Oral (07/01 0100) BP: 83/58 mmHg (07/01 0900) Pulse Rate: 121 (07/01 0915) Intake/Output from previous day: 06/30 0701 - 07/01 0700 In: 5243.7 [I.V.:3749.1; NG/GT:489.6; IV Piggyback:925] Out: 436 [Urine:436] Intake/Output from this shift:    Labs:  Recent Labs  2013-12-11 1140 10/29/13 0500 10/29/13 1110 10/30/13 0355  WBC 7.1 7.9 7.8 13.9*  HGB 13.6 12.8 12.6 13.2  PLT 52* 43* 42* 70*  CREATININE 1.48* 1.53*  --  2.52*   Estimated Creatinine Clearance: 28 ml/min (by C-G formula based on Cr of 2.52). No results found for this basename: VANCOTROUGH, Leodis BinetVANCOPEAK, VANCORANDOM, GENTTROUGH, GENTPEAK, GENTRANDOM, TOBRATROUGH, TOBRAPEAK, TOBRARND, AMIKACINPEAK, AMIKACINTROU, AMIKACIN,  in the last 72 hours   Microbiology: Recent Results (from the past 720 hour(s))  URINE CULTURE     Status: None   Collection Time    10/27/13 12:36 PM      Result Value Ref Range Status   Specimen Description URINE, CLEAN CATCH   Final   Special Requests NONE   Final   Culture  Setup Time     Final   Value: 10/08/13 00:06     [facility]formed at [facility]   Colony Count     Final   Value: NO GROWTH     [facility]formed [facility]   Culture     Final   Value: NO GROWTH     Performed at [facility]   Report Status 10/29/2013 FINAL   Final  CULTURE, BLOOD (ROUTINE X 2)     Status: None   Collection Time    2013-12-11 12:00 PM      Result Value Ref Range Status   Specimen Description BLOOD RIGHT ANTECUBITAL   Final   Special Requests     Final   Value: BOTTLES DRAWN AEROBIC AND ANAEROBIC AEB=12CC ANA=10CC   Culture NO GROWTH  1 DAY   Final   Report Status PENDING   Incomplete  CULTURE, BLOOD (ROUTINE X 2)     Status: None   Collection Time    2013-12-11 12:25 PM      Result Value Ref Range Status   Specimen Description BLOOD LEFT HAND   Final   Special Requests BOTTLES DRAWN AEROBIC ONLY 6CC   Final   Culture NO GROWTH 1 DAY   Final   Report Status PENDING   Incomplete  URINE CULTURE     Status: None   Collection Time    2013-12-11 12:36 PM      Result Value Ref Range Status   Specimen Description URINE, CATHETERIZED   Final   Special Requests NONE   Final   Culture  Setup Time     Final   Value: 10/08/13 23:20     Performed at [facility]   Colony Count     Final   Value: NO GROWTH     Performed at [facility]   Culture     Final   Value: NO GROWTH     Performed at [facility]   Report Status 10/29/2013 FINAL   Final  URINE CULTURE     Status:  None   Collection Time    10/25/2013  6:19 PM      Result Value Ref Range Status   Specimen Description URINE, CATHETERIZED   Final   Special Requests Normal   Final   Culture  Setup Time     Final   Value: 10/21/2013 19:10     Performed at [facility] Count     Final   Value: NO GROWTH     Performed at [facility]   Culture     Final   Value: NO GROWTH     Performed at [facility]   Report Status 10/29/2013 FINAL   Final  MRSA PCR SCREENING     Status: None   Collection Time    10/24/2013  6:19 PM      Result Value Ref Range Status   MRSA by PCR NEGATIVE  NEGATIVE Final   Comment:            The GeneXpert MRSA Assay (FDA     approved for NASAL specimens     only), is one component of a     comprehensive MRSA colonization     surveillance program. It is not     intended to diagnose MRSA     infection nor to guide or     monitor treatment for     MRSA infections.  CULTURE, BLOOD (ROUTINE X 2)     Status: None   Collection Time    10/19/2013  7:55 PM      Result Value Ref Range Status    Specimen Description BLOOD LEFT HAND   Final   Special Requests BOTTLES DRAWN AEROBIC ONLY 1CC   Final   Culture  Setup Time     Final   Value: 10/23/2013 23:16     Performed at [facility]   Culture     Final   Value:        BLOOD CULTURE RECEIVED NO GROWTH TO DATE CULTURE WILL BE HELD FOR 5 DAYS BEFORE ISSUING A FINAL NEGATIVE REPORT     Performed at [facility]   Report Status PENDING   Incomplete  CULTURE, BLOOD (ROUTINE X 2)     Status: None   Collection Time    10/06/2013  8:13 PM      Result Value Ref Range Status   Specimen Description BLOOD LEFT FOREARM   Final   Special Requests BOTTLES DRAWN AEROBIC ONLY 3CC   Final   Culture  Setup Time     Final   Value: 10/08/2013 23:16     Performed at [facility]   Culture     Final   Value:        BLOOD CULTURE RECEIVED NO GROWTH TO DATE CULTURE WILL BE HELD FOR 5 DAYS BEFORE ISSUING A FINAL NEGATIVE REPORT     Performed at [facility]   Report Status PENDING   Incomplete    Medical History: Past Medical History  Diagnosis Date  . Sleep apnea     somewhat compliant with CPAP  . CVA (cerebral infarction) 1970  . Seizures   . Gout   . Hyperlipidemia   . Vertigo    Assessment: 68 year old who presented with cough, weakness, and confusion was started on Zosyn and Vancomycin for empiric sepsis coverage. Temp remains elevated at 103 F. WBC trended up to 13.9 and PCT is up to 36. CrCl has trended down to  25 mL/min.  Patient has received two doses of vancomycin 750 mg IV so far.   Vanc 6/29 >> Zosyn 6/29 >> Doxy 6/30 >>  6/29 bldx2 -ngtd 6/29 sputum - 6/29 urine - Neg 6/39 Ova and parasite >   Goal of Therapy:  Vancomycin trough level 15-20 mcg/ml  Plan:  - Change Vanc to 1500 mg IV Q48H - Continue Zosyn 3.375gm IV Q8H, 4 hr infusion - Continue Doxy 100mg  IV Q12H - F/u vanc random in AM tomorrow prior to next dose    Vinnie LevelBenjamin Zenith Kercheval, PharmD.  Clinical Pharmacist Pager  [phone]

## 2013-10-30 NOTE — Progress Notes (Signed)
CRITICAL VALUE ALERT  Critical value received: CO2 10, Ca 6  Date of notification:  10-30-13  Time of notification: 0440  Critical value read back:Yes.    Nurse who received alert:  Jamesetta Soarla Lacey Wallman RN  MD notified (1st page): Billy Fischeravid Simonds  Time of first page:  [phone]  Responding MD: Billy Fischeravid Simonds  Time MD responded:  [phone]

## 2013-10-30 NOTE — Research (Signed)
Protocol Title: The MIND-[place] Study Modifying the Impact of ICU-Associated Neurological Dysfunction: A multicenter, doubleblind, randomized, placebo-controlled trial investigating the effects of intravenously (IV) administered typical and atypical antipsychotics (haloperidol and ziprasidone) on delirium in critically ill patients.   This patient, Arbutus PedBrenda Warr, has been consented to the above clinical trial according to FDA regulations, GCP guidelines and [facility] SOPs. The study design has been explained to this patient's LAR, [facility] LP[facility]obby Luppino, and the surrogate demonstrated comprehension. No study procedures have been initiated before consenting of this patient/surrogate. This surrogate was read the local version 4 of the informed consent form in it's entirety by Henderson BaltimoreStacey Phelps,RN, and the reading of this informed consent form was witnessed by Beatriz Stallionarolyn Hedrick, CCRP.  This surrogate was given sufficient time for asking questions. All risks, benefits and options have been thoroughly discussed. This patient/surrogate was not coerced in any way to participate or to continue participation in this clinical trial. The surrogate has signed local version 4 of the informed consent form voluntarily at 4:00pm on October 30, 2013. The signing of this consent form was witnessed by Kinnie FeilStacey Phelps, RN and Beatriz Stallionarolyn Hedrick, CCRP.  This surrogate was given a copy of this consent.   This patient will be monitored for the development of delirium. While enrolled in the study, no open label antipsychotic medications can be administered to the patient.   Kinnie FeilStacey Phelps, RN  [phone]

## 2013-10-30 NOTE — Progress Notes (Signed)
PULMONARY / CRITICAL CARE MEDICINE   Name: Tracie [name] MRN: [id number] DOB: 29-Jan-1946    ADMISSION DATE:  10/11/2013 CONSULTATION DATE:  6/39/2015  REFERRING MD :  EDP PRIMARY SERVICE: PCCM  CHIEF COMPLAINT:  SOB  BRIEF PATIENT DESCRIPTION: 68 y.o. F transferred from [place] with septic shock, unclear source. Concern tick borne illness, MODS.  SIGNIFICANT EVENTS / STUDIES:  6/28 - presented to ED with SOB, treated w rocephin for UTI and sent home with levaquin for bronchitis. 6/29 - back to ED in respiratory distress, hypotensive, lactic acidosis, fever to 104.  Transferred to [facility] ICU. 6/30 renal US>>>. Negative for hydronephrosis.  Hepatic steatosis 6/30 echo - Limited study quality but there appears to be normal biventricular size and systolic function. Impaired relaxation with normal filling pressures 6/30 doppler legs bilat>>>neg 7/1- worsening MODS, shock, acidosis  LINES / TUBES: Foley 6/29 >>> 6/29 ETT>>> 6/29 rt [place]>>> 6/29 fem aline>>>  CULTURES: Blood 6/29 >>> Urine 6/29 >>> Respiratory 6/29  >>> RVP 6/29 >>>  Lyme titer>>>  RMSF>>>  ANTIBIOTICS: Zosyn 6/29 >>> Vancomycin 6/29 >>> Doxy 6/29>>>  SUBJECTIVE:  Rapidly rising pressors  VITAL SIGNS: Temp:  [98.9 F (37.2 C)-103 F (39.4 C)] 103 F (39.4 C) (07/01 0615) Pulse Rate:  [110-128] 128 (07/01 0615) Resp:  [10-35] 30 (07/01 0615) BP: (71-139)/(36-118) 96/50 mmHg (07/01 0600) SpO2:  [97 %-100 %] 100 % (07/01 0615) Arterial Line BP: (80-115)/(33-57) 80/46 mmHg (07/01 0615) FiO2 (%):  [40 %-50 %] 40 % (07/01 0324) Weight:  [106.5 kg (234 lb 12.6 oz)] 106.5 kg (234 lb 12.6 oz) (07/01 0345) HEMODYNAMICS: CVP:  [8 mmHg-18 mmHg] 18 mmHg VENTILATOR SETTINGS: Vent Mode:  [-] PRVC FiO2 (%):  [40 %-50 %] 40 % Set Rate:  [25 bmp-30 bmp] 30 bmp Vt Set:  [550 mL] 550 mL PEEP:  [5 cmH20] 5 cmH20 Plateau Pressure:  [18 cmH20-26 cmH20] 18 cmH20 INTAKE / OUTPUT: Intake/Output     06/30 0701 - 07/01 0700  07/01 0701 - 07/02 0700   P.O.     I.V. (mL/kg) 3749.1 (35.2)    Other 80    NG/GT 489.6    IV Piggyback 925    Total Intake(mL/kg) 5243.7 (49.2)    Urine (mL/kg/hr) 436 (0.2)    Total Output 436     Net +4807.7            PHYSICAL EXAMINATION: General: Elderly appearing female, on vent Neuro: rass -2, moves all ext equal, follows commands, int drowsy HEENT: ett, jvd increased Cardiovascular: RRR, no M/R/G.  Lungs: increased rhochi Abdomen: BS x 4, soft, NT/ND Musculoskeletal: No gross deformities, increased edema. Peripheral pulses +   LABS:  CBC  Recent Labs Lab 10/29/13 0500 10/29/13 1110 10/30/13 0355  WBC 7.9 7.8 13.9*  HGB 12.8 12.6 13.2  HCT 36.8 37.4 40.4  PLT 43* 42* 70*   Coag's  Recent Labs Lab 10/04/2013 1130 10/01/2013 2055  APTT 43* 48*  INR 1.34 1.49   BMET  Recent Labs Lab 10/08/2013 1140 10/29/13 0500 10/30/13 0355  NA 135* 137 133*  K 3.7 4.0 4.0  CL 97 101 100  CO2 20 14* 10*  BUN 34* 41* 58*  CREATININE 1.48* 1.53* 2.52*  GLUCOSE 109* 116* 182*   Electrolytes  Recent Labs Lab 10/29/2013 1140 10/29/13 0500 10/29/13 1110 10/29/13 2158 10/30/13 0355  CALCIUM 7.6* 6.7*  --   --  6.0*  MG  --  2.0 2.0 1.9  --   PHOS  --  2.3 2.5 2.5  --    Sepsis Markers  Recent Labs Lab 10/12/2013 1955 10/29/13 0020 10/29/13 0500 10/29/13 0556 10/30/13 0355  LATICACIDVEN 5.9* 3.9*  --  4.0*  --   PROCALCITON 20.00  --  23.98  --  36.13   ABG  Recent Labs Lab 10/29/13 0400 10/29/13 1636 10/30/13 0342  PHART 7.275* 7.222* 7.174*  PCO2ART 32.3* 27.5* 32.2*  PO2ART 138.0* 105.0* 120.0*   Liver Enzymes  Recent Labs Lab 10/11/2013 1140 10/29/13 0500 10/30/13 0355  AST 238* 264* 255*  ALT 116* 101* 83*  ALKPHOS 88 84 123*  BILITOT 1.4* 1.5* 1.5*  ALBUMIN 2.9* 2.4* 2.0*   Cardiac Enzymes  Recent Labs Lab 10/02/2013 1140  TROPONINI <0.30  PROBNP 2537.0*   Glucose  Recent Labs Lab 10/29/13 1726 10/29/13 1753  10/29/13 1924 10/29/13 2337 10/29/13 2352 10/30/13 0351  GLUCAP 68* 120* 179* 61* 201* 174*    Imaging US Renal  10/29/2013   CLINICAL DATA:  Urinary tract infection and septic stock. Rule out hydronephrosis.  EXAM: RENAL/URINARY TRACT ULTRASOUND COMPLETE  COMPARISON:  None.  FINDINGS: Right Kidney:  Length: 12 cm. Echogenicity within normal limits. No mass or hydronephrosis visualized.  Left Kidney:  Length: 12 cm. Echogenicity within normal limits. No mass or hydronephrosis visualized.  Bladder:  Not visualized; reportedly there is a Foley catheter present.  There is increased echogenicity of the imaged liver, obscuring the portal triads.  IMPRESSION: 1. Negative for hydronephrosis. 2. Hepatic steatosis.   Electronically Signed   By: Jorje Guild M.D.   On: 10/29/2013 05:37   Portable Chest Xray In Am  10/30/2013   CLINICAL DATA:  Endotracheal tube.  Central line.  EXAM: PORTABLE CHEST - 1 VIEW  COMPARISON:  Chest x-ray from yesterday  FINDINGS: Endotracheal tube ends in the mid thoracic trachea. An orogastric tube crosses the diaphragm. There is a right IJ central line with tip overlapping the SVC origin.  Stable heart size and mediastinal contours. No cardiomegaly. There is shifting bandlike opacities in the lower lungs, now most prominent in the left mid chest. No edema, effusion, or pneumothorax.  IMPRESSION: 1. Stable positioning of tubes and central line. 2. Mild basilar atelectasis.   Electronically Signed   By: Jorje Guild M.D.   On: 10/30/2013 05:55   Portable Chest Xray  10/03/2013   CLINICAL DATA:  Status post ET tube placement  EXAM: PORTABLE CHEST - 1 VIEW  COMPARISON:  10/19/2013  FINDINGS: ET tube tip is above the carina. There is a nasogastric tube tip in the stomach. The right IJ catheter tip is in the SVC. Normal heart size. Atelectasis is identified within the left base.  IMPRESSION: 1. Satisfactory position of the ET tube with tip above the carina.   Electronically Signed    By: Kerby Moors M.D.   On: 10/22/2013 23:44   Dg Chest Port 1 View  10/24/2013   CLINICAL DATA:  Shortness of breath  EXAM: PORTABLE CHEST - 1 VIEW  COMPARISON:  PA and lateral chest of October 27, 2013  FINDINGS: The lungs are slightly less well inflated but positioning has changed. The interstitial markings are slightly increased in prominence. The cardiac silhouette remains top-normal in size. The central pulmonary vascularity is prominent. There is no pleural effusion. The bony thorax is unremarkable.  IMPRESSION: Mild interstitial edema is present and more conspicuous today. This may be related to underlying low-grade CHF.   Electronically Signed   By: David  Martinique  On: 10/20/2013 12:09   Dg Abd Portable 1v  10/26/2013   CLINICAL DATA:  Status post OG tube placement  EXAM: PORTABLE ABDOMEN - 1 VIEW  COMPARISON:  None  FINDINGS: The enteric tube tip is in the distal stomach. The side port is well below the GE junction. No dilated bowel loops. Cholecystectomy clips noted.  IMPRESSION: Tip of the enteric tube is in the distal stomach.   Electronically Signed   By: Kerby Moors M.D.   On: 10/14/2013 23:45     ASSESSMENT / PLAN:  PULMONARY A: Acute Respiratory Failure Compensated respiratory alkalosis OSA R/o developing PNA P:   - Doppler legs neg, normal RV, low clinical suspicion for PE -abg reviewed, correct met acidosis as able -rate maxed, TV keep at 8 cc/kg, permissive hypercapnia -pcxr in am follow linear atx seen todays  CARDIOVASCULAR A:  Septic Shock -lactate improved day prior Hypovolemia fuirther today 7/1 P:  - goal MAP 60-65, levophed rising, add vaso - Echo assessed, rv wnl, unlikely PE -cvp 8, bolus, continued volume -May need addition epinephine infusion, increase levo first -svo2 -correct ph as able -give more volume, see heme  RENAL A:   AG metabolic acidosis - lactate. Smaller NON AG  AKI - SCr improved from 6/28. Hypocalcemia even after  correction P:   -bicarb for NON AG ok -avoid saline as able if cl rises further -follow bmet q4h, abg, may need consideration early cvvhd -replace calcium -assess ion  GASTROINTESTINAL A:   Nutrition Transaminitis - sepsis,. Tick borne illness P:   - tolerating TF -ppi -lft to follow further -repeat ldh -may need ct abdo/pelvis assessment  HEMATOLOGIC A:  Thrombocytopenia - favor some degree of DIC hemoconcentration today  P:  - VTE Proph:  SCD's only. - lamictal held for lowplat -cbc in am  INFECTIOUS A:   Septic Shock Concern PNA? UTI - resolved (treated 6/28). DIC association? Tick bite 10 days ago, r/o tick born illness P:   - PCt rising, concern missing organisms, source control? Will d/w ID -maintain zosyn, vanc, doxy -she is from [place], low risk res organism -may need assessment CT abdo/pelvis - consider r/o stone not seen on [place], other source, if stable enough for this trip -repeat UA  ENDOCRINE A:   No evidence AI P:   - Monitor glucose on BMP -ssi  NEUROLOGIC A:   Hx of Seizures Acute encephalopathy P:   - Hold outpatient Lamictal (see DERM section below). - Keppra or other anticonvulsant -lamictal held for low plat - fent with WUA  DERMATOLOGY A: Oral lesions- r/o early SJS R/o tic born illness  P: - No Bactrim, hold Lamictal. - Supportive care. Rash better on face - doxy  Summary - pct rising, pressors rising, CT abdo/pelvis, add vaso, may need cvvhd, epi drip  I have personally obtained a history, examined the patient, evaluated laboratory and imaging results, formulated the assessment and plan and placed orders. CRITICAL CARE: The patient is critically ill with multiple organ systems failure and requires high complexity decision making for assessment and support, frequent evaluation and titration of therapies, application of advanced monitoring technologies and extensive interpretation of multiple databases. Critical Care Time devoted  to patient care services described in this note is 45 minutes.   Lavon Paganini. Titus Mould, MD, [place] Pgr: [facility]

## 2013-10-30 NOTE — Progress Notes (Signed)
[facility]    Subjective: No new complaints   Antibiotics:  Anti-infectives   Start     Dose/Rate Route Frequency Ordered Stop   10/31/13 1000  vancomycin (VANCOCIN) 1,500 mg in sodium chloride 0.9 % 500 mL IVPB     1,500 mg 250 mL/hr over 120 Minutes Intravenous Every 48 hours 10/30/13 0802     10/30/13 1400  imipenem-cilastatin (PRIMAXIN) 250 mg in sodium chloride 0.9 % 100 mL IVPB     250 mg 200 mL/hr over 30 Minutes Intravenous Every 6 hours 10/30/13 1108     10/29/13 1100  levofloxacin (LEVAQUIN) IVPB 750 mg  Status:  Discontinued     750 mg 100 mL/hr over 90 Minutes Intravenous Every 48 hours 10/29/13 1000 10/29/13 1002   10/29/13 1100  doxycycline (VIBRAMYCIN) 100 mg in dextrose 5 % 250 mL IVPB     100 mg 125 mL/hr over 120 Minutes Intravenous Every 12 hours 10/29/13 1002     10/29/13 1000  vancomycin (VANCOCIN) IVPB 750 mg/150 ml premix  Status:  Discontinued     750 mg 150 mL/hr over 60 Minutes Intravenous Every 12 hours 10/09/2013 1851 10/30/13 0802   10/18/2013 2200  piperacillin-tazobactam (ZOSYN) IVPB 3.375 g  Status:  Discontinued     3.375 g 12.5 mL/hr over 240 Minutes Intravenous 3 times per day 10/14/2013 1851 10/30/13 1031   10/17/2013 1900  vancomycin (VANCOCIN) IVPB 1000 mg/200 mL premix     1,000 mg 200 mL/hr over 60 Minutes Intravenous NOW 09/30/2013 1851 10/05/2013 2120   10/26/2013 1400  vancomycin (VANCOCIN) IVPB 1000 mg/200 mL premix     1,000 mg 200 mL/hr over 60 Minutes Intravenous  Once 10/11/2013 1346 10/03/2013 1626   10/27/2013 1345  piperacillin-tazobactam (ZOSYN) IVPB 3.375 g     3.375 g 100 mL/hr over 30 Minutes Intravenous  Once 10/13/2013 1346 10/05/2013 1501      Medications: Scheduled Meds: . antiseptic oral rinse  15 mL Mouth Rinse QID  . doxycycline (VIBRAMYCIN) IV  100 mg Intravenous Q12H  . feeding supplement (VITAL HIGH PROTEIN)  1,000 mL Per Tube Q24H  . imipenem-cilastatin  250 mg Intravenous Q6H  . insulin aspart   0-9 Units Subcutaneous 6 times per day  . iohexol  25 mL Oral Q1 Hr x 2  . levETIRAcetam  500 mg Per Tube BID  . pantoprazole (PROTONIX) IV  40 mg Intravenous Q24H  . [START ON 10/31/2013] pneumococcal 23 valent vaccine  0.5 mL Intramuscular Tomorrow-1000  . [START ON 10/31/2013] vancomycin  1,500 mg Intravenous Q48H   Continuous Infusions: . sodium chloride 100 mL/hr at 10/30/13 0902  . epinephrine    . fentaNYL infusion INTRAVENOUS 100 mcg/hr (10/30/13 0100)  . norepinephrine (LEVOPHED) Adult infusion 50 mcg/min (10/30/13 0900)  . dialysis replacement fluid (prismasate)    . dialysate (PRISMASATE)    .  sodium bicarbonate 150 mEq in sterile water 1000 mL infusion 125 mL/hr at 10/30/13 0459  . sodium bicarbonate (isotonic) 1000 mL infusion    . vasopressin (PITRESSIN) infusion - *FOR SHOCK* 0.03 Units/min (10/30/13 0858)   PRN Meds:.fentaNYL, heparin, sodium chloride, sodium chloride    Objective: Weight change: 12 lb 9.1 oz (5.7 kg)  Intake/Output Summary (Last 24 hours) at 10/30/13 1246 Last data filed at 10/30/13 1200  Gross per 24 hour  Intake 5666.05 ml  Output    236 ml  Net 5430.05 ml   Blood pressure 122/60, pulse 112, temperature 101.5 F (  38.6 C), temperature source Oral, resp. rate 30, height 5\' 8"  (1.727 m), weight 234 lb 12.6 oz (106.5 kg), SpO2 98.00%. Temp:  [98.9 F (37.2 C)-103 F (39.4 C)] 101.5 F (38.6 C) (07/01 1230) Pulse Rate:  [110-128] 112 (07/01 1230) Resp:  [10-35] 30 (07/01 1230) BP: (78-161)/(36-141) 122/60 mmHg (07/01 1203) SpO2:  [97 %-100 %] 98 % (07/01 1203) Arterial Line BP: (80-116)/(33-57) 116/52 mmHg (07/01 0930) FiO2 (%):  [40 %] 40 % (07/01 1203) Weight:  [234 lb 12.6 oz (106.5 kg)] 234 lb 12.6 oz (106.5 kg) (07/01 0345)  Physical Exam: General: sedated on the ventilator HEENT: , EOMI CVS tachycardic normal r,  no murmur rubs or gallops Chest:  rhonchi Abdomen: soft slightly distended not clear if she is tendernormal bowel  sounds, Extremities: no  clubbing or edema noted bilaterally Skin: see pictures of rash below progressive nonbacterial        Neuro: nonfocal  CBC:  Recent Labs Lab 10/27/13 0748 10/15/2013 1130 10/05/2013 1140 10/19/2013 2055 10/29/13 0500 10/29/13 1110 10/30/13 0355 10/30/13 0803  HGB 14.3  --  13.6  --  12.8 12.6 13.2  --   HCT 40.2  --  38.8  --  36.8 37.4 40.4  --   PLT 45* 52* 52*  --  43* 42* 70*  --   INR  --  1.34  --  1.49  --   --   --  1.92*  APTT  --  43*  --  48*  --   --   --  49*     BMET  Recent Labs  10/30/13 0355 10/30/13 0803  NA 133* 135*  K 4.0 3.9  CL 100 98  CO2 10* 14*  GLUCOSE 182* 176*  BUN 58* 60*  CREATININE 2.52* 2.81*  CALCIUM 6.0* 6.2*     Liver Panel   Recent Labs  10/29/13 0500 10/30/13 0355  PROT 5.6* 5.1*  ALBUMIN 2.4* 2.0*  AST 264* 255*  ALT 101* 83*  ALKPHOS 84 123*  BILITOT 1.5* 1.5*       Sedimentation Rate No results found for this basename: ESRSEDRATE,  in the last 72 hours C-Reactive Protein No results found for this basename: CRP,  in the last 72 hours  Micro Results: Recent Results (from the past 240 hour(s))  URINE CULTURE     Status: None   Collection Time    10/27/13 12:36 PM      Result Value Ref Range Status   Specimen Description URINE, CLEAN CATCH   Final   Special Requests NONE   Final   Culture  Setup Time     Final   Value: 10/25/2013 00:06     Performed at [facility] Count     Final   Value: NO GROWTH     Performed at [facility]   Culture     Final   Value: NO GROWTH     Performed at [facility]   Report Status 10/29/2013 FINAL   Final  CULTURE, BLOOD (ROUTINE X 2)     Status: None   Collection Time    10/05/2013 12:00 PM      Result Value Ref Range Status   Specimen Description BLOOD RIGHT ANTECUBITAL   Final   Special Requests     Final   Value: BOTTLES DRAWN AEROBIC AND ANAEROBIC AEB=12CC ANA=10CC   Culture NO GROWTH 2 DAYS   Final    Report Status  PENDING   Incomplete  CULTURE, BLOOD (ROUTINE X 2)     Status: None   Collection Time    10/22/2013 12:25 PM      Result Value Ref Range Status   Specimen Description BLOOD LEFT HAND   Final   Special Requests BOTTLES DRAWN AEROBIC ONLY 6CC   Final   Culture NO GROWTH 2 DAYS   Final   Report Status PENDING   Incomplete  URINE CULTURE     Status: None   Collection Time    10/06/2013 12:36 PM      Result Value Ref Range Status   Specimen Description URINE, CATHETERIZED   Final   Special Requests NONE   Final   Culture  Setup Time     Final   Value: 10/14/2013 23:20     [facility]   [place] Count     Final   Value: NO GROWTH     [facility]   Culture     Final   Value: NO GROWTH     [facility]   Report Status 10/29/2013 FINAL   Final  URINE CULTURE     Status: None   Collection Time    10/09/2013  6:19 PM      Result Value Ref Range Status   Specimen Description URINE, CATHETERIZED   Final   Special Requests Normal   Final   Culture  Setup Time     Final   Value: 10/18/2013 19:10     [facility]   Colony Count     Final   Value: NO GROWTH     [facility]   Culture     Final   Value: NO GROWTH     [facility]   Report Status 10/29/2013 FINAL   Final  MRSA PCR SCREENING     Status: None   Collection Time    10/16/2013  6:19 PM      Result Value Ref Range Status   MRSA by PCR NEGATIVE  NEGATIVE Final   Comment:            The GeneXpert MRSA Assay (FDA     approved for NASAL specimens     only), is one component of a     comprehensive MRSA colonization     surveillance program. It is not     intended to diagnose MRSA     infection nor to guide or     monitor treatment for     MRSA infections.  CULTURE, BLOOD (ROUTINE X 2)     Status: None   Collection Time    10/26/2013  7:55 PM      Result Value Ref Range Status   Specimen  Description BLOOD LEFT HAND   Final   Special Requests BOTTLES DRAWN AEROBIC ONLY 1CC   Final   Culture  Setup Time     Final   Value: 10/22/2013 23:16     [facility]   Culture     Final   Value:        BLOOD CULTURE RECEIVED NO GROWTH TO DATE CULTURE WILL BE HELD FOR 5 DAYS BEFORE ISSUING A FINAL NEGATIVE REPORT     [facility]   Report Status PENDING   Incomplete  CULTURE, BLOOD (ROUTINE X 2)     Status: None  Collection Time    10/26/2013  8:13 PM      Result Value Ref Range Status   Specimen Description BLOOD LEFT FOREARM   Final   Special Requests BOTTLES DRAWN AEROBIC ONLY 3CC   Final   Culture  Setup Time     Final   Value: 10/05/2013 23:16     [facility]   Culture     Final   Value:        BLOOD CULTURE RECEIVED NO GROWTH TO DATE CULTURE WILL BE HELD FOR 5 DAYS BEFORE ISSUING A FINAL NEGATIVE REPORT     [facility]   Report Status PENDING   Incomplete    Studies/Results: [name] Renal  10/29/2013   CLINICAL DATA:  Urinary tract infection and septic stock. Rule out hydronephrosis.  EXAM: RENAL/URINARY TRACT ULTRASOUND COMPLETE  COMPARISON:  None.  FINDINGS: Right Kidney:  Length: 12 cm. Echogenicity within normal limits. No mass or hydronephrosis visualized.  Left Kidney:  Length: 12 cm. Echogenicity within normal limits. No mass or hydronephrosis visualized.  Bladder:  Not visualized; reportedly there is a Foley catheter present.  There is increased echogenicity of the imaged liver, obscuring the portal triads.  IMPRESSION: 1. Negative for hydronephrosis. 2. Hepatic steatosis.   Electronically Signed   By: Tiburcio PeaJonathan  Watts M.D.   On: 10/29/2013 05:37   Dg Chest Port 1 View  10/30/2013   CLINICAL DATA:  Post Diatek catheter placement  EXAM: PORTABLE CHEST - 1 VIEW  COMPARISON:  Portable exam 1154 hr compared to 10/30/2013 at 0455 hr  FINDINGS: Tip of endotracheal tube projects 3.9 cm above carina.   RIGHT jugular central venous catheter tip projects over SVC.  New LEFT jugular Diatek catheter with tip projecting over cavoatrial junction.  Normal heart size and mediastinal contours.  Slight pulmonary vascular congestion.  Peribronchial thickening without infiltrate, pleural effusion or pneumothorax.  External artifacts project over chest.  IMPRESSION: Bronchitic changes and slight pulmonary vascular congestion.  No pneumothorax following LEFT jugular line placement.   Electronically Signed   By: Ulyses SouthwardMark  Boles M.D.   On: 10/30/2013 12:16   Portable Chest Xray In Am  10/30/2013   CLINICAL DATA:  Endotracheal tube.  Central line.  EXAM: PORTABLE CHEST - 1 VIEW  COMPARISON:  Chest x-ray from yesterday  FINDINGS: Endotracheal tube ends in the mid thoracic trachea. An orogastric tube crosses the diaphragm. There is a right IJ central line with tip overlapping the SVC origin.  Stable heart size and mediastinal contours. No cardiomegaly. There is shifting bandlike opacities in the lower lungs, now most prominent in the left mid chest. No edema, effusion, or pneumothorax.  IMPRESSION: 1. Stable positioning of tubes and central line. 2. Mild basilar atelectasis.   Electronically Signed   By: Tiburcio PeaJonathan  Watts M.D.   On: 10/30/2013 05:55   Portable Chest Xray  10/14/2013   CLINICAL DATA:  Status post ET tube placement  EXAM: PORTABLE CHEST - 1 VIEW  COMPARISON:  10/01/2013  FINDINGS: ET tube tip is above the carina. There is a nasogastric tube tip in the stomach. The right IJ catheter tip is in the SVC. Normal heart size. Atelectasis is identified within the left base.  IMPRESSION: 1. Satisfactory position of the ET tube with tip above the carina.   Electronically Signed   By: Signa Kellaylor  Stroud M.D.   On: 10/16/2013 23:44   Dg Abd Portable 1v  10/03/2013   CLINICAL DATA:  Status  post OG tube placement  EXAM: PORTABLE ABDOMEN - 1 VIEW  COMPARISON:  None  FINDINGS: The enteric tube tip is in the distal stomach. The side  port is well below the GE junction. No dilated bowel loops. Cholecystectomy clips noted.  IMPRESSION: Tip of the enteric tube is in the distal stomach.   Electronically Signed   By: Signa Kell M.D.   On: 10/29/2013 23:45      Assessment/Plan:  Principal Problem:   Sepsis Active Problems:   Acute respiratory failure   Acute renal failure   Elevated LFTs   Acidosis   Encephalopathy acute   Thrombocytopenia   Septic shock    Tracie [name] is a 68 y.o. female  presents with sepsis and Acute respiratory distress requiring intubation in the setting of pulmonary symptoms reports having history of tick bites in the last 7-10 days. Lab reveal thrombocytopenia, mildly elevated transaminitis, physical exam has lacy rash now worse on back.   Has been on vancomycin, piptazo and doxycycline.   Overnight continues to worsen with high pressor suport  Agree with imaging her abdomen if technically feasible.  Will broaden her from Zosyn to imipenem.  I do worry if this is a bad RMSF that has progressed   Agree with continuing doxy   - RMSF, erhlichia, lyme WB are pending. There are no diagnostics approved presently for STARI (southern tick associated rash illness) but it is often treated with doxycycline due to resemblance to early lyme disease though it is not know to be even due to a pathogen. R Parkeri another possibility also being treated with doxy  Dr. Luciana Axe covering over the 4th weekend beginning tomorrow.    LOS: 2 days   Acey Lav 10/30/2013, 12:46 PM

## 2013-10-30 NOTE — Consult Note (Signed)
Reason for Consult:ARF, metabolic acidosis, Severe sepsis syndrome Referring Physician: Tyson AliasFeinstein, MD  Tracie [name] is an 68 y.o. female.  HPI: Pt is a 68yo WF with PMH sig for obesity, OSA, and CVA who was admitted on 10/04/2013 who presented to an outside hospital on 10/27/13 with fevers and SOB treated with rocephin and levaquin but only to return on 6/29 with worsening SOB/fever/sepsis.  [facility] and required intubation for VDRF and initiated on pressors due to SIRS.  She apparently had tick bites and ID was consulted due to rash, HA, fever, and thrombocytopemia.  STARI is on differential diagnosis and is being covered with doxycycline as well.  We were asked to see the patient due to the development of AKI, metabolic acidosis, need for increased pressors, and decreased UOP, as well as consideration for CVVHD.  Trend in Creatinine: Creatinine, Ser  Date/Time Value Ref Range Status  10/30/2013  8:03 AM 2.81* 0.50 - 1.10 mg/dL Final  1/6/10967/05/2013  0:453:55 AM 2.52* 0.50 - 1.10 mg/dL Final  4/09/81196/30/2015  1:475:00 AM 1.53* 0.50 - 1.10 mg/dL Final  8/29/56216/29/2015 30:8611:40 AM 1.48* 0.50 - 1.10 mg/dL Final  5/78/46966/28/2015  2:957:48 AM 1.77* 0.50 - 1.10 mg/dL Final    PMH:   Past Medical History  Diagnosis Date  . Sleep apnea     somewhat compliant with CPAP  . CVA (cerebral infarction) 1970  . Seizures   . Gout   . Hyperlipidemia   . Vertigo     PSH:   Past Surgical History  Procedure Laterality Date  . Cholecystectomy    . Knee arthroscopy      Right    Allergies:  Allergies  Allergen Reactions  . Sulfa Antibiotics Rash    Oral blisters    Medications:   Prior to Admission medications   Medication Sig Start Date End Date Taking? Authorizing Provider  acetaminophen (TYLENOL) 500 MG tablet Take 500-1,000 mg by mouth 2 (two) times daily.   Yes Historical Provider, MD  albuterol (PROVENTIL HFA;VENTOLIN HFA) 108 (90 BASE) MCG/ACT inhaler Inhale 2 puffs into the lungs every 6 (six) hours as  needed for wheezing or shortness of breath.   Yes Historical Provider, MD  HYDROcodone-homatropine (HYCODAN) 5-1.5 MG/5ML syrup Take 5 mLs by mouth at bedtime.   Yes Historical Provider, MD  lamoTRIgine (LAMICTAL) 100 MG tablet Take 100 mg by mouth 3 (three) times daily.  08/01/09  Yes Historical Provider, MD  levofloxacin (LEVAQUIN) 500 MG tablet Take 1 tablet (500 mg total) by mouth daily. 10/27/13  Yes Rolland PorterMark James, MD  promethazine (PHENERGAN) 25 MG tablet Take 25 mg by mouth every 6 (six) hours as needed for nausea or vomiting.   Yes Historical Provider, MD  sulfamethoxazole-trimethoprim (BACTRIM DS) 800-160 MG per tablet Take 1 tablet by mouth 2 (two) times daily.   Yes Historical Provider, MD  Vitamin D, Ergocalciferol, (DRISDOL) 50000 UNITS CAPS capsule Take 50,000 Units by mouth every 7 (seven) days. On Saturday.   Yes Historical Provider, MD    Inpatient medications: . antiseptic oral rinse  15 mL Mouth Rinse QID  . doxycycline (VIBRAMYCIN) IV  100 mg Intravenous Q12H  . feeding supplement (VITAL HIGH PROTEIN)  1,000 mL Per Tube Q24H  . imipenem-cilastatin  250 mg Intravenous Q6H  . insulin aspart  0-9 Units Subcutaneous 6 times per day  . iohexol  25 mL Oral Q1 Hr x 2  . levETIRAcetam  500 mg Per Tube BID  .  pantoprazole (PROTONIX) IV  40 mg Intravenous Q24H  . [START ON 10/31/2013] pneumococcal 23 valent vaccine  0.5 mL Intramuscular Tomorrow-1000  . [START ON 10/31/2013] vancomycin  1,500 mg Intravenous Q48H    Discontinued Meds:   Medications Discontinued During This Encounter  Medication Reason  . 0.9 %  sodium chloride infusion   . 0.9 %  sodium chloride infusion   . acetaminophen (TYLENOL) suppository 650 mg   . hydrocortisone sodium succinate (SOLU-CORTEF) 100 MG injection 50 mg   . levofloxacin (LEVAQUIN) IVPB 750 mg   . fentaNYL (SUBLIMAZE) injection 50 mcg   . etomidate (AMIDATE) injection 20 mg   . feeding supplement (PRO-STAT SUGAR FREE 64) liquid 30 mL   . feeding  supplement (VITAL HIGH PROTEIN) liquid 1,000 mL   . norepinephrine (LEVOPHED) 4 mg in dextrose 5 % 250 mL infusion   . chlorhexidine (PERIDEX) 0.12 % solution 15 mL   . antiseptic oral rinse (BIOTENE) solution 15 mL   . antiseptic oral rinse (BIOTENE) solution 15 mL Duplicate  . 0.9 %  sodium chloride infusion   . vancomycin (VANCOCIN) IVPB 750 mg/150 ml premix   . 0.9 %  sodium chloride infusion   . 0.9 %  sodium chloride infusion   . piperacillin-tazobactam (ZOSYN) IVPB 3.375 g     Social History:  reports that she has never smoked. She does not have any smokeless tobacco history on file. She reports that she does not drink alcohol or use illicit drugs.  Family History:   Family History  Problem Relation Age of Onset  . Heart attack Father     Review of systems not obtained due to patient factors. Weight change: 5.7 kg (12 lb 9.1 oz)  Intake/Output Summary (Last 24 hours) at 10/30/13 1124 Last data filed at 10/30/13 0700  Gross per 24 hour  Intake 4671.05 ml  Output    311 ml  Net 4360.05 ml   BP 112/58  Pulse 122  Temp(Src) 102.3 F (39.1 C) (Oral)  Resp 30  Ht 5\' 8"  (1.727 m)  Wt 106.5 kg (234 lb 12.6 oz)  BMI 35.71 kg/m2  SpO2 99% Filed Vitals:   10/30/13 0900 10/30/13 0915 10/30/13 0930 10/30/13 1100  BP: 83/58   112/58  Pulse: 122 121 121 122  Temp: 102.6 F (39.2 C) 102.5 F (39.2 C) 102.4 F (39.1 C) 102.3 F (39.1 C)  TempSrc:      Resp: 30 27 24 30   Height:      Weight:      SpO2: 99% 98%  99%     General appearance: toxic and intubated Head: Normocephalic, without obvious abnormality, atraumatic Eyes: no icterus, but +scleral edema Neck: +plethora Resp: occ rhonchi Cardio: tachycardic, no rub GI: obese, +BS, soft Extremities: edema tr pretib  and cyanosis of toes bilaterally Skin:  Diffuse maculopapular rash on abd, chest,  and back  Labs: Basic Metabolic Panel:  Recent Labs Lab 10/27/13 0748 10/07/2013 1140 10/29/13 0500 10/29/13 1110  10/29/13 2158 10/30/13 0355 10/30/13 0500 10/30/13 0803  NA 130* 135* 137  --   --  133*  --  135*  K 3.5* 3.7 4.0  --   --  4.0  --  3.9  CL 91* 97 101  --   --  100  --  98  CO2 20 20 14*  --   --  10*  --  14*  GLUCOSE 155* 109* 116*  --   --  182*  --  176*  BUN 33* 34* 41*  --   --  58*  --  60*  CREATININE 1.77* 1.48* 1.53*  --   --  2.52*  --  2.81*  ALBUMIN  --  2.9* 2.4*  --   --  2.0*  --   --   CALCIUM 8.4 7.6* 6.7*  --   --  6.0*  --  6.2*  PHOS  --   --  2.3 2.5 2.5  --  2.6  --    Liver Function Tests:  Recent Labs Lab 2013/11/03 1140 10/29/13 0500 10/30/13 0355  AST 238* 264* 255*  ALT 116* 101* 83*  ALKPHOS 88 84 123*  BILITOT 1.4* 1.5* 1.5*  PROT 6.5 5.6* 5.1*  ALBUMIN 2.9* 2.4* 2.0*    Recent Labs Lab 11/03/13 1140 10/29/13 1110  LIPASE 47  --   AMYLASE  --  83   No results found for this basename: AMMONIA,  in the last 168 hours CBC:  Recent Labs Lab 10/27/13 0748  2013-11-03 1140 10/29/13 0500 10/29/13 1110 10/30/13 0355  WBC 5.9  --  7.1 7.9 7.8 13.9*  NEUTROABS 4.9  --  6.2  --  6.9 11.7*  HGB 14.3  --  13.6 12.8 12.6 13.2  HCT 40.2  --  38.8 36.8 37.4 40.4  MCV 84.1  --  84.9 85.8 87.8 88.8  PLT 45*  < > 52* 43* 42* 70*  < > = values in this interval not displayed. PT/INR: @LABRCNTIP (inr:5) Cardiac Enzymes: ) Recent Labs Lab 11-03-13 1140  TROPONINI <0.30   CBG:  Recent Labs Lab 10/29/13 1924 10/29/13 2337 10/29/13 2352 10/30/13 0351 10/30/13 1010  GLUCAP 179* 61* 201* 174* 170*    Iron Studies: No results found for this basename: IRON, TIBC, TRANSFERRIN, FERRITIN,  in the last 168 hours  Xrays/Other Studies: US Renal  10/29/2013   CLINICAL DATA:  Urinary tract infection and septic stock. Rule out hydronephrosis.  EXAM: RENAL/URINARY TRACT ULTRASOUND COMPLETE  COMPARISON:  None.  FINDINGS: Right Kidney:  Length: 12 cm. Echogenicity within normal limits. No mass or hydronephrosis visualized.  Left Kidney:  Length: 12  cm. Echogenicity within normal limits. No mass or hydronephrosis visualized.  Bladder:  Not visualized; reportedly there is a Foley catheter present.  There is increased echogenicity of the imaged liver, obscuring the portal triads.  IMPRESSION: 1. Negative for hydronephrosis. 2. Hepatic steatosis.   Electronically Signed   By: Tiburcio Pea M.D.   On: 10/29/2013 05:37   Portable Chest Xray In Am  10/30/2013   CLINICAL DATA:  Endotracheal tube.  Central line.  EXAM: PORTABLE CHEST - 1 VIEW  COMPARISON:  Chest x-ray from yesterday  FINDINGS: Endotracheal tube ends in the mid thoracic trachea. An orogastric tube crosses the diaphragm. There is a right IJ central line with tip overlapping the SVC origin.  Stable heart size and mediastinal contours. No cardiomegaly. There is shifting bandlike opacities in the lower lungs, now most prominent in the left mid chest. No edema, effusion, or pneumothorax.  IMPRESSION: 1. Stable positioning of tubes and central line. 2. Mild basilar atelectasis.   Electronically Signed   By: Tiburcio Pea M.D.   On: 10/30/2013 05:55   Portable Chest Xray  2013-11-03   CLINICAL DATA:  Status post ET tube placement  EXAM: PORTABLE CHEST - 1 VIEW  COMPARISON:  Nov 03, 2013  FINDINGS: ET tube tip is above the carina. There is a nasogastric tube tip in the stomach. The  right IJ catheter tip is in the SVC. Normal heart size. Atelectasis is identified within the left base.  IMPRESSION: 1. Satisfactory position of the ET tube with tip above the carina.   Electronically Signed   By: Signa Kell M.D.   On: 2013/10/31 23:44   Dg Chest Port 1 View  31-Oct-2013   CLINICAL DATA:  Shortness of breath  EXAM: PORTABLE CHEST - 1 VIEW  COMPARISON:  PA and lateral chest of October 27, 2013  FINDINGS: The lungs are slightly less well inflated but positioning has changed. The interstitial markings are slightly increased in prominence. The cardiac silhouette remains top-normal in size. The central pulmonary  vascularity is prominent. There is no pleural effusion. The bony thorax is unremarkable.  IMPRESSION: Mild interstitial edema is present and more conspicuous today. This may be related to underlying low-grade CHF.   Electronically Signed   By: David  [place]   On: 2013/10/31 12:09   Dg Abd Portable 1v  October 31, 2013   CLINICAL DATA:  Status post OG tube placement  EXAM: PORTABLE ABDOMEN - 1 VIEW  COMPARISON:  None  FINDINGS: The enteric tube tip is in the distal stomach. The side port is well below the GE junction. No dilated bowel loops. Cholecystectomy clips noted.  IMPRESSION: Tip of the enteric tube is in the distal stomach.   Electronically Signed   By: Signa Kell M.D.   On: 10/31/13 23:45     Assessment/Plan: 1. AKI in setting of SIRS.  Becoming oliguric with increasing pressor requirements.   1. Given decrease in UOP, worsening metabolic acidosis, and need for increased pressor support agree with initiation of CVVHD without UF. 2. Keep even and no heparin 2. Metabolic acidosis- secondary to #1 1. Plan to initiate CVVHD with isotonic bicarb and follow electrolytes  3. SIRS- on doxy, imipenem, and vanco.  Also on pressors per PCCM 4. VDRF- per PCCM 5. Possible tick-borne illness- on doxy and appreciate ID input 6. Thrombocytopenia 7. Abnormal LFT's- ?shock liver vs. Ehrlichiosis/rickettsial disease 8. Hyperglycemia/DM- per PCCM   Daquisha Clermont A 10/30/2013, 11:24 AM

## 2013-10-30 NOTE — Procedures (Signed)
Hemodialysis Insertion Procedure Note Tracie JasperBrenda B [name] [id number] 03/10/1946  Procedure: Insertion of Hemodialysis Catheter Type: 3 port  Indications: Hemodialysis   Procedure Details Consent: Risks of procedure as well as the alternatives and risks of each were explained to the (patient/caregiver).  Consent for procedure obtained. Time Out: Verified patient identification, verified procedure, site/side was marked, verified correct patient position, special equipment/implants available, medications/allergies/relevent history reviewed, required imaging and test results available.  Performed  Maximum sterile technique was used including antiseptics, cap, gloves, gown, hand hygiene, mask and sheet. Skin prep: Chlorhexidine; local anesthetic administered A antimicrobial bonded/coated triple lumen catheter was placed in the left internal jugular vein using the Seldinger technique. Ultrasound guidance used.Yes.   Catheter placed to 20 cm. Blood aspirated via all 3 ports and then flushed x 3. Line sutured x 2 and dressing applied.  Evaluation Blood flow good Complications: No apparent complications Patient did tolerate procedure well. Chest X-ray ordered to verify placement.  CXR: pending.  Brett CanalesSteve Minor ACNP [facility] Pager [phone] till 3 pm If no answer page [phone] 10/30/2013, 11:56 AM   For cvvhd Consent done US ffp Mcarthur RossettiDaniel J. Tyson AliasFeinstein, MD, FACP Pgr: [phone] [facility]

## 2013-10-30 NOTE — Progress Notes (Signed)
CRITICAL VALUE ALERT  Critical value received:  Calcium 5.9  Date of notification:  10/30/2013  Time of notification:  1330  Critical value read back:Yes.    Nurse who received alert:  Lamount CrankerKelli Hunt  MD notified (1st page):  Dr. Terrial RhodesJoseph Coladonato  Time of first page:  1330  MD notified (2nd page):  Time of second page:  Responding MD:  Dr. Terrial RhodesJoseph Coladonato  Time MD responded:  [phone]

## 2013-10-30 NOTE — Progress Notes (Signed)
CRITICAL VALUE ALERT  Critical value received:  PH 7.17  Date of notification:  10-30-13  Time of notification:  0352  Critical value read back:Yes.    Nurse who received alert:  Jamesetta Soarla Miri Jose RN  MD notified (1st page):  Billy Fischeravid Simonds  Time of first page:  [phone]  Responding MD:  Billy Fischeravid Simonds  Time MD responded:  [phone]

## 2013-10-30 NOTE — Progress Notes (Signed)
eLink Physician-Brief Progress Note Patient Name: Tracie [name] DOB: 11/14/1945 MRN: [id number]  Date of Service  10/30/2013   HPI/Events of Note  Acidosis, acute renal failure, hypotension refractory to vasopressors   eICU Interventions  Change IVFs to HCO3 infusion   Intervention Category Major Interventions: Hypotension - evaluation and management;Acid-Base disturbance - evaluation and management  Billy FischerDavid Jasun Gasparini 10/30/2013, 4:18 AM

## 2013-10-30 DEATH — deceased

## 2013-10-31 ENCOUNTER — Inpatient Hospital Stay (HOSPITAL_COMMUNITY): Payer: Medicare HMO

## 2013-10-31 DIAGNOSIS — A419 Sepsis, unspecified organism: Secondary | ICD-10-CM | POA: Diagnosis not present

## 2013-10-31 LAB — PREPARE FRESH FROZEN PLASMA
UNIT DIVISION: 0
Unit division: 0

## 2013-10-31 LAB — RENAL FUNCTION PANEL
ALBUMIN: 1.8 g/dL — AB (ref 3.5–5.2)
ANION GAP: 22 — AB (ref 5–15)
Albumin: 1.7 g/dL — ABNORMAL LOW (ref 3.5–5.2)
Anion gap: 14 (ref 5–15)
BUN: 42 mg/dL — ABNORMAL HIGH (ref 6–23)
BUN: 32 mg/dL — ABNORMAL HIGH (ref 6–23)
CALCIUM: 5.9 mg/dL — AB (ref 8.4–10.5)
CO2: 19 mEq/L (ref 19–32)
CO2: 27 mEq/L (ref 19–32)
Calcium: 5.6 mg/dL — CL (ref 8.4–10.5)
Chloride: 91 mEq/L — ABNORMAL LOW (ref 96–112)
Chloride: 94 mEq/L — ABNORMAL LOW (ref 96–112)
Creatinine, Ser: 2.26 mg/dL — ABNORMAL HIGH (ref 0.50–1.10)
Creatinine, Ser: 1.92 mg/dL — ABNORMAL HIGH (ref 0.50–1.10)
GFR calc Af Amer: 30 mL/min — ABNORMAL LOW (ref >90)
GFR calc non Af Amer: 21 mL/min — ABNORMAL LOW (ref >90)
GFR, EST AFRICAN AMERICAN: 25 mL/min — AB (ref >90)
GFR, EST NON AFRICAN AMERICAN: 26 mL/min — AB (ref >90)
GLUCOSE: 197 mg/dL — AB (ref 70–99)
Glucose, Bld: 177 mg/dL — ABNORMAL HIGH (ref 70–99)
PHOSPHORUS: 1.3 mg/dL — AB (ref 2.3–4.6)
PHOSPHORUS: 1.2 mg/dL — AB (ref 2.3–4.6)
POTASSIUM: 3.2 meq/L — AB (ref 3.7–5.3)
Potassium: 3.9 mEq/L (ref 3.7–5.3)
SODIUM: 135 meq/L — AB (ref 137–147)
Sodium: 132 mEq/L — ABNORMAL LOW (ref 137–147)

## 2013-10-31 LAB — POCT I-STAT 3, ART BLOOD GAS (G3+)
ACID-BASE DEFICIT: 4 mmol/L — AB (ref 0.0–2.0)
Acid-base deficit: 1 mmol/L (ref 0.0–2.0)
BICARBONATE: 19.9 meq/L — AB (ref 20.0–24.0)
Bicarbonate: 23.4 mEq/L (ref 20.0–24.0)
O2 SAT: 92 %
O2 Saturation: 90 %
PCO2 ART: 38.5 mmHg (ref 35.0–45.0)
PO2 ART: 66 mmHg — AB (ref 80.0–100.0)
PO2 ART: 62 mmHg — AB (ref 80.0–100.0)
Patient temperature: 99.9
Patient temperature: 100
TCO2: 21 mmol/L (ref 0–100)
TCO2: 25 mmol/L (ref 0–100)
pCO2 arterial: 33.9 mmHg — ABNORMAL LOW (ref 35.0–45.0)
pH, Arterial: 7.38 (ref 7.350–7.450)
pH, Arterial: 7.395 (ref 7.350–7.450)

## 2013-10-31 LAB — PROTIME-INR
INR: 3.22 — ABNORMAL HIGH (ref 0.00–1.49)
PROTHROMBIN TIME: 32.9 s — AB (ref 11.6–15.2)

## 2013-10-31 LAB — BASIC METABOLIC PANEL
Anion gap: 16 — ABNORMAL HIGH (ref 5–15)
BUN: 33 mg/dL — ABNORMAL HIGH (ref 6–23)
CO2: 26 mEq/L (ref 19–32)
Calcium: 5.9 mg/dL — CL (ref 8.4–10.5)
Chloride: 93 mEq/L — ABNORMAL LOW (ref 96–112)
Creatinine, Ser: 1.95 mg/dL — ABNORMAL HIGH (ref 0.50–1.10)
GFR, EST AFRICAN AMERICAN: 30 mL/min — AB (ref >90)
GFR, EST NON AFRICAN AMERICAN: 26 mL/min — AB (ref >90)
Glucose, Bld: 177 mg/dL — ABNORMAL HIGH (ref 70–99)
POTASSIUM: 4.1 meq/L (ref 3.7–5.3)
Sodium: 135 mEq/L — ABNORMAL LOW (ref 137–147)

## 2013-10-31 LAB — CBC WITH DIFFERENTIAL/PLATELET
Basophils Absolute: 0.5 10*3/uL — ABNORMAL HIGH (ref 0.0–0.1)
Basophils Relative: 4 % — ABNORMAL HIGH (ref 0–1)
Eosinophils Absolute: 0.1 10*3/uL (ref 0.0–0.7)
Eosinophils Relative: 1 % (ref 0–5)
HEMATOCRIT: 32.2 % — AB (ref 36.0–46.0)
Hemoglobin: 11.5 g/dL — ABNORMAL LOW (ref 12.0–15.0)
Lymphocytes Relative: 18 % (ref 12–46)
Lymphs Abs: 2.1 10*3/uL (ref 0.7–4.0)
MCH: 30.3 pg (ref 26.0–34.0)
MCHC: 35.7 g/dL (ref 30.0–36.0)
MCV: 84.7 fL (ref 78.0–100.0)
MONO ABS: 1 10*3/uL (ref 0.1–1.0)
MONOS PCT: 9 % (ref 3–12)
NEUTROS ABS: 7.8 10*3/uL — AB (ref 1.7–7.7)
Neutrophils Relative %: 68 % (ref 43–77)
Platelets: 42 10*3/uL — ABNORMAL LOW (ref 150–400)
RBC: 3.8 MIL/uL — ABNORMAL LOW (ref 3.87–5.11)
RDW: 15.6 % — AB (ref 11.5–15.5)
WBC: 11.5 10*3/uL — AB (ref 4.0–10.5)

## 2013-10-31 LAB — TROPONIN I: Troponin I: <0.3 ng/mL (ref <0.30)

## 2013-10-31 LAB — HEPATIC FUNCTION PANEL
ALT: 71 U/L — ABNORMAL HIGH (ref 0–35)
AST: 233 U/L — ABNORMAL HIGH (ref 0–37)
Albumin: 1.7 g/dL — ABNORMAL LOW (ref 3.5–5.2)
Alkaline Phosphatase: 166 U/L — ABNORMAL HIGH (ref 39–117)
BILIRUBIN DIRECT: 2.1 mg/dL — AB (ref 0.0–0.3)
Indirect Bilirubin: 0.4 mg/dL (ref 0.3–0.9)
TOTAL PROTEIN: 4.8 g/dL — AB (ref 6.0–8.3)
Total Bilirubin: 2.5 mg/dL — ABNORMAL HIGH (ref 0.3–1.2)

## 2013-10-31 LAB — GLUCOSE, CAPILLARY
Glucose-Capillary: 195 mg/dL — ABNORMAL HIGH (ref 70–99)
Glucose-Capillary: 139 mg/dL — ABNORMAL HIGH (ref 70–99)
Glucose-Capillary: 64 mg/dL — ABNORMAL LOW (ref 70–99)
Glucose-Capillary: 156 mg/dL — ABNORMAL HIGH (ref 70–99)
Glucose-Capillary: 136 mg/dL — ABNORMAL HIGH (ref 70–99)
Glucose-Capillary: 123 mg/dL — ABNORMAL HIGH (ref 70–99)

## 2013-10-31 LAB — PHOSPHORUS
PHOSPHORUS: 1 mg/dL — AB (ref 2.3–4.6)
Phosphorus: 1.2 mg/dL — ABNORMAL LOW (ref 2.3–4.6)
Phosphorus: 1.1 mg/dL — ABNORMAL LOW (ref 2.3–4.6)

## 2013-10-31 LAB — APTT: aPTT: >200 seconds (ref 24–37)

## 2013-10-31 LAB — MAGNESIUM
MAGNESIUM: 2 mg/dL (ref 1.5–2.5)
Magnesium: 1.9 mg/dL (ref 1.5–2.5)
Magnesium: 2 mg/dL (ref 1.5–2.5)

## 2013-10-31 LAB — FIBRINOGEN: Fibrinogen: <60 mg/dL — CL (ref 204–475)

## 2013-10-31 LAB — CALCIUM, IONIZED
CALCIUM ION: 0.76 mmol/L — AB (ref 1.13–1.30)
Calcium, Ion: 0.8 mmol/L — ABNORMAL LOW (ref 1.13–1.30)

## 2013-10-31 LAB — VANCOMYCIN, RANDOM: Vancomycin Rm: 14.7 ug/mL

## 2013-10-31 MED ORDER — STUDY - INVESTIGATIONAL DRUG SIMPLE RECORD (ML)
0.2500 mL | Freq: Two times a day (BID) | Status: DC
  Administered 2013-10-31: 0.5 mL via INTRAVENOUS
  Administered 2013-10-31: 1 mL via INTRAVENOUS
  Administered 2013-11-01 – 2013-11-04 (×8): 2 mL via INTRAVENOUS
  Filled 2013-10-31 (×6): qty 2

## 2013-10-31 MED ORDER — MIDAZOLAM HCL 2 MG/2ML IJ SOLN
INTRAMUSCULAR | Status: AC
  Administered 2013-10-31: 2 mg via INTRAVENOUS
  Filled 2013-10-31: qty 2

## 2013-10-31 MED ORDER — PRISMASOL BGK 4/2.5 32-4-2.5 MEQ/L IV SOLN
INTRAVENOUS | Status: DC
  Administered 2013-11-01 – 2013-11-08 (×14): via INTRAVENOUS_CENTRAL
  Filled 2013-10-31 (×16): qty 5000

## 2013-10-31 MED ORDER — DEXTROSE 5 % IV SOLN
2.0000 ug/min | INTRAVENOUS | Status: DC
Start: 2013-10-31 — End: 2013-10-31
  Administered 2013-10-31: 30 ug/min via INTRAVENOUS
  Filled 2013-10-31: qty 4

## 2013-10-31 MED ORDER — VITAMIN K1 10 MG/ML IJ SOLN
10.0000 mg | Freq: Once | INTRAVENOUS | Status: AC
  Administered 2013-10-31: 10 mg via INTRAVENOUS
  Filled 2013-10-31: qty 1

## 2013-10-31 MED ORDER — AMIODARONE HCL IN DEXTROSE 360-4.14 MG/200ML-% IV SOLN
60.0000 mg/h | INTRAVENOUS | Status: AC
  Administered 2013-10-31 (×3): 60 mg/h via INTRAVENOUS
  Filled 2013-10-31: qty 200

## 2013-10-31 MED ORDER — MIDAZOLAM HCL 2 MG/2ML IJ SOLN: 1.0000 mg | INTRAMUSCULAR | Status: DC | PRN

## 2013-10-31 MED ORDER — AMIODARONE HCL IN DEXTROSE 360-4.14 MG/200ML-% IV SOLN
30.0000 mg/h | INTRAVENOUS | Status: DC
  Administered 2013-11-01 (×2): 30 mg/h via INTRAVENOUS
  Filled 2013-10-31 (×12): qty 200

## 2013-10-31 MED ORDER — SODIUM CHLORIDE 0.9 % IV BOLUS (SEPSIS)
500.0000 mL | INTRAVENOUS | Status: AC
  Administered 2013-10-31: 500 mL via INTRAVENOUS

## 2013-10-31 MED ORDER — AMIODARONE LOAD VIA INFUSION
150.0000 mg | Freq: Once | INTRAVENOUS | Status: DC
  Filled 2013-10-31: qty 83.34

## 2013-10-31 MED ORDER — VANCOMYCIN HCL 10 G IV SOLR
1500.0000 mg | INTRAVENOUS | Status: DC
  Filled 2013-10-31: qty 1500

## 2013-10-31 MED ORDER — DEXTROSE 50 % IV SOLN
INTRAVENOUS | Status: AC
  Administered 2013-10-31: 25 mL
  Filled 2013-10-31: qty 50

## 2013-10-31 MED ORDER — DEXTROSE 5 % IV SOLN: 150.0000 mg | Freq: Once | INTRAVENOUS | Status: DC

## 2013-10-31 MED ORDER — NOREPINEPHRINE BITARTRATE 1 MG/ML IV SOLN
2.0000 ug/min | INTRAVENOUS | Status: DC
  Administered 2013-10-31: 35 ug/min via INTRAVENOUS
  Administered 2013-11-01: 20 ug/min via INTRAVENOUS
  Administered 2013-11-01: 36 ug/min via INTRAVENOUS
  Administered 2013-11-02: 40 ug/min via INTRAVENOUS
  Administered 2013-11-02: 12 ug/min via INTRAVENOUS
  Filled 2013-10-31 (×4): qty 16

## 2013-10-31 MED ORDER — PHENYLEPHRINE HCL 10 MG/ML IJ SOLN
30.0000 ug/min | INTRAVENOUS | Status: DC
  Filled 2013-10-31: qty 1

## 2013-10-31 NOTE — Progress Notes (Signed)
CRITICAL VALUE ALERT  Critical value received:  Ca+ 5.6  Date of notification:  10-31-12  Time of notification: 0527  Critical value read back:Yes.    Nurse who received alert:  Jamesetta Soarla Alwaleed Obeso RN  MD notified (1st page): Dr Sung AmabileSimonds  Time of first page: [phone]  Responding MD:  Dr simonds  Time MD responded: [phone]

## 2013-10-31 NOTE — Progress Notes (Signed)
[place] for Vancomycin, Imipenem  Indication: rule out sepsis  Allergies  Allergen Reactions  . Sulfa Antibiotics Rash    Oral blisters    Patient Measurements: Height: _0  (172.7 cm) Weight: 244 lb 11.4 oz (111 kg) IBW/kg (Calculated) : 63.9  Vital Signs: Temp: 99.8 F (37.7 C) (07/02 1123) BP: 76/45 mmHg (07/02 1000) Pulse Rate: 91 (07/02 1123) Intake/Output from previous day: 07/01 0701 - 07/02 0700 In: 9481.9 [I.V.:7489.4; Blood:242.5; NG/GT:950; IV MEQASTMHD:622] Out: 2979 [Urine:110] Intake/Output from this shift: Total I/O In: 1258.8 [I.V.:913.8; NG/GT:195; IV Piggyback:150] Out: 1096 [Urine:4; Other:1092]  Labs:  Recent Labs  10/29/13 1110 10/30/13 0355 10/30/13 0803 10/30/13 1225 10/31/13 0400 10/31/13 0500  WBC 7.8 13.9*  --   --  11.5*  --   HGB 12.6 13.2  --   --  11.5*  --   PLT 42* 70*  --   --  42*  --   CREATININE  --  2.52* 2.81* 2.88*  --  2.26*   Estimated Creatinine Clearance: 32 ml/min (by C-G formula based on Cr of 2.26).  Recent Labs  10/31/13 0500  VANCORANDOM 14.7     Microbiology: Recent Results (from the past 720 hour(s))  URINE CULTURE     Status: None   Collection Time    10/27/13 12:36 PM      Result Value Ref Range Status   Specimen Description URINE, CLEAN CATCH   Final   Special Requests NONE   Final   Culture  Setup Time     Final   Value: 10/02/2013 00:06     Performed at [facility]     Final   Value: NO GROWTH     Performed at [facility]   Culture     Final   Value: NO GROWTH     Performed at [facility]   Report Status 10/29/2013 FINAL   Final  CULTURE, BLOOD (ROUTINE X 2)     Status: None   Collection Time    09/30/2013 12:00 PM      Result Value Ref Range Status   Specimen Description BLOOD RIGHT ANTECUBITAL   Final   Special Requests     Final   Value: BOTTLES DRAWN AEROBIC AND ANAEROBIC AEB=12CC ANA=10CC   Culture  Setup  Time     Final   Value: 10/30/2013 14:13     Performed at [facility]   Culture     Final   Value:        BLOOD CULTURE RECEIVED NO GROWTH TO DATE CULTURE WILL BE HELD FOR 5 DAYS BEFORE ISSUING A FINAL NEGATIVE REPORT     Performed at [facility]   Report Status PENDING   Incomplete  CULTURE, BLOOD (ROUTINE X 2)     Status: None   Collection Time    10/08/2013 12:25 PM      Result Value Ref Range Status   Specimen Description BLOOD LEFT HAND   Final   Special Requests BOTTLES DRAWN AEROBIC ONLY [place]   Final   Culture  Setup Time     Final   Value: 10/30/2013 14:14     Performed at [facility]   Culture     Final   Value:        BLOOD CULTURE RECEIVED NO GROWTH TO DATE CULTURE WILL BE HELD FOR 5 DAYS BEFORE ISSUING A FINAL NEGATIVE REPORT  Performed at [facility]   Report Status PENDING   Incomplete  URINE CULTURE     Status: None   Collection Time    10/09/2013 12:36 PM      Result Value Ref Range Status   Specimen Description URINE, CATHETERIZED   Final   Special Requests NONE   Final   Culture  Setup Time     Final   Value: 10/05/2013 23:20     Performed at [place]     Final   Value: NO GROWTH     Performed at [facility]   Culture     Final   Value: NO GROWTH     Performed at [facility]   Report Status 10/29/2013 FINAL   Final  URINE CULTURE     Status: None   Collection Time    10/03/2013  6:19 PM      Result Value Ref Range Status   Specimen Description URINE, CATHETERIZED   Final   Special Requests Normal   Final   Culture  Setup Time     Final   Value: 10/26/2013 19:10     Performed at [place]     Final   Value: NO GROWTH     Performed at [facility]   Culture     Final   Value: NO GROWTH     Performed at [facility]   Report Status 10/29/2013 FINAL   Final  MRSA PCR SCREENING     Status: None   Collection Time    10/07/2013  6:19 PM       Result Value Ref Range Status   MRSA by PCR NEGATIVE  NEGATIVE Final   Comment:            The GeneXpert MRSA Assay (FDA     approved for NASAL specimens     only), is one component of a     comprehensive MRSA colonization     surveillance program. It is not     intended to diagnose MRSA     infection nor to guide or     monitor treatment for     MRSA infections.  CULTURE, BLOOD (ROUTINE X 2)     Status: None   Collection Time    10/29/2013  7:55 PM      Result Value Ref Range Status   Specimen Description BLOOD LEFT HAND   Final   Special Requests BOTTLES DRAWN AEROBIC ONLY 1CC   Final   Culture  Setup Time     Final   Value: 10/17/2013 23:16     Performed at [facility]   Culture     Final   Value:        BLOOD CULTURE RECEIVED NO GROWTH TO DATE CULTURE WILL BE HELD FOR 5 DAYS BEFORE ISSUING A FINAL NEGATIVE REPORT     Performed at [facility]   Report Status PENDING   Incomplete  RESPIRATORY VIRUS PANEL     Status: Abnormal   Collection Time    10/12/2013  8:01 PM      Result Value Ref Range Status   Source - RVPAN NASAL SWAB   Corrected   Comment: CORRECTED ON 07/01 AT 1356: PREVIOUSLY REPORTED AS NASAL SWAB   Respiratory Syncytial Virus A NOT DETECTED   Final   Respiratory Syncytial Virus B NOT DETECTED   Final  Influenza A NOT DETECTED   Final   Influenza B NOT DETECTED   Final   Parainfluenza 1 NOT DETECTED   Final   Parainfluenza 2 NOT DETECTED   Final   Parainfluenza 3 NOT DETECTED   Final   Metapneumovirus NOT DETECTED   Final   Rhinovirus DETECTED (*)  Final   Adenovirus NOT DETECTED   Final   Influenza A H1 NOT DETECTED   Final   Influenza A H3 NOT DETECTED   Final   Comment: (NOTE)           Normal Reference Range for each Analyte: NOT DETECTED     Testing performed using the Luminex xTAG Respiratory Viral Panel test     kit.     This test was developed and its performance characteristics determined     by [facility]. It has  not been cleared or approved by the [place]     Food and Drug Administration. This test is used for clinical purposes.     It should not be regarded as investigational or for research. This     laboratory is certified under the [place] (CLIA) as qualified to perform high complexity     clinical laboratory testing.     Performed at [place], BLOOD (ROUTINE X 2)     Status: None   Collection Time    10/21/2013  8:13 PM      Result Value Ref Range Status   Specimen Description BLOOD LEFT FOREARM   Final   Special Requests BOTTLES DRAWN AEROBIC ONLY 3CC   Final   Culture  Setup Time     Final   Value: 10/21/2013 23:16     Performed at [facility]   Culture     Final   Value:        BLOOD CULTURE RECEIVED NO GROWTH TO DATE CULTURE WILL BE HELD FOR 5 DAYS BEFORE ISSUING A FINAL NEGATIVE REPORT     Performed at [facility]   Report Status PENDING   Incomplete    Medical History: Past Medical History  Diagnosis Date  . Sleep apnea     somewhat compliant with CPAP  . CVA (cerebral infarction) 1970  . Seizures   . Gout   . Hyperlipidemia   . Vertigo    Assessment: 68 year old who presented with cough, weakness, and confusion was started on Zosyn and Vancomycin for empiric sepsis coverage. Zosyn was then switched to Imipenem. She is also on doxycycline for tick bite. Temp remains elevated at 102.6 F. WBC down to 11.5 and PCT is up to 36. CrCl improved slightly to ~ 32 mL/min. A random AM vanc level collected this AM was essentially therapeutic at 14.7.   Vanc 6/29 >> Zosyn 6/29 >>7/1 Doxy 6/30 >> Imipenem 7/1>>   6/29 bldx2 -ngtd 6/29 sputum - 6/29 urine - Neg 6/39 Ova and parasite >   Goal of Therapy:  Vancomycin trough level 15-20 mcg/ml  Plan:  - Change Vanc to 1500 mg IV Q24 hours - Continue Imipenem 250 mg IV Q 6 hours  - Continue Doxy 11m IV Q12H - Monitor CBC, renal fx, cultures and  patient's clinical progress.     BAlbertina Parr PharmD.  Clinical Pharmacist Pager [phone]

## 2013-10-31 NOTE — Progress Notes (Signed)
CRITICAL VALUE ALERT  Critical value received:  APTT  Date of notification: 10-31-13  Time of notification:  0540  Critical value read back:Yes.    Nurse who received alert:  Jamesetta Soarla Jaythen Hamme RN  MD notified (1st page):  Dr Sung AmabileSimonds  Time of first page: [phone]  Responding MD:  Dr Sung AmabileSimonds  Time MD responded: [phone]

## 2013-10-31 NOTE — Progress Notes (Signed)
Patient ID: Tracie [name], female   DOB: 05/09/1945, 68 y.o.   MRN: [id number] S:intubated but awake O:BP 103/67  Pulse 95  Temp(Src) 99.6 F (37.6 C) (Rectal)  Resp 30  Ht 5\' 8"  (1.727 m)  Wt 111 kg (244 lb 11.4 oz)  BMI 37.22 kg/m2  SpO2 99%  Intake/Output Summary (Last 24 hours) at 10/31/13 0835 Last data filed at 10/31/13 0800  Gross per 24 hour  Intake 9499.57 ml  Output   6258 ml  Net 3241.57 ml   Intake/Output: I/O last 3 completed shifts: In: 12215.5 [I.V.:9348.4; Blood:242.5; Other:60; NG/GT:1289.6; IV Piggyback:1275] Out: 5616 [Urine:196; Other:5420]  Intake/Output this shift:  Total I/O In: 369.6 [I.V.:304.6; NG/GT:65] Out: 728 [Urine:4; Other:724] Weight change: 4.5 kg (9 lb 14.7 oz) WUJ:WJXBJGen:obese WF, intubated and critically ill YNW:GNFAOCVS:tachy Resp:cta ZHY:QMVHAbd:soft, ND Ext:tr edema, +cyanosis of toes   Recent Labs Lab 10/27/13 0748 2013/09/29 1140 10/29/13 0500 10/29/13 1110 10/29/13 2158 10/30/13 0355 10/30/13 0500 10/30/13 0803 10/30/13 1225 10/30/13 1701 10/31/13 0500  NA 130* 135* 137  --   --  133*  --  135* 131*  --  132*  K 3.5* 3.7 4.0  --   --  4.0  --  3.9 3.7  --  3.2*  CL 91* 97 101  --   --  100  --  98 96  --  91*  CO2 20 20 14*  --   --  10*  --  14* 14*  --  19  GLUCOSE 155* 109* 116*  --   --  182*  --  176* 211*  --  197*  BUN 33* 34* 41*  --   --  58*  --  60* 60*  --  42*  CREATININE 1.77* 1.48* 1.53*  --   --  2.52*  --  2.81* 2.88*  --  2.26*  ALBUMIN  --  2.9* 2.4*  --   --  2.0*  --   --  1.7*  --  1.8*  CALCIUM 8.4 7.6* 6.7*  --   --  6.0*  --  6.2* 5.9*  --  5.6*  PHOS  --   --  2.3 2.5 2.5  --  2.6  --  2.4 2.5 1.3*  AST  --  238* 264*  --   --  255*  --   --   --   --   --   ALT  --  116* 101*  --   --  83*  --   --   --   --   --    Liver Function Tests:  Recent Labs Lab 2013/09/29 1140 10/29/13 0500 10/30/13 0355 10/30/13 1225 10/31/13 0500  AST 238* 264* 255*  --   --   ALT 116* 101* 83*  --   --   ALKPHOS 88 84 123*  --    --   BILITOT 1.4* 1.5* 1.5*  --   --   PROT 6.5 5.6* 5.1*  --   --   ALBUMIN 2.9* 2.4* 2.0* 1.7* 1.8*    Recent Labs Lab 2013/09/29 1140 10/29/13 1110  LIPASE 47  --   AMYLASE  --  83   No results found for this basename: AMMONIA,  in the last 168 hours CBC:  Recent Labs Lab 2013/09/29 1140 10/29/13 0500 10/29/13 1110 10/30/13 0355 10/31/13 0400  WBC 7.1 7.9 7.8 13.9* 11.5*  NEUTROABS 6.2  --  6.9 11.7* 7.8*  HGB 13.6 12.8  12.6 13.2 11.5*  HCT 38.8 36.8 37.4 40.4 32.2*  MCV 84.9 85.8 87.8 88.8 84.7  PLT 52* 43* 42* 70* 42*   Cardiac Enzymes:  Recent Labs Lab 10/13/2013 1140  TROPONINI <0.30   CBG:  Recent Labs Lab 10/30/13 1656 10/30/13 1901 10/30/13 1945 10/30/13 2326 10/31/13 0414  GLUCAP 110* 67* 191* 211* 195*    Iron Studies: No results found for this basename: IRON, TIBC, TRANSFERRIN, FERRITIN,  in the last 72 hours Studies/Results: Ct Abdomen Pelvis Wo Contrast  10/30/2013   CLINICAL DATA:  Cough and shortness of breath. High fevers. Sepsis.  EXAM: CT CHEST, ABDOMEN AND PELVIS WITHOUT CONTRAST  TECHNIQUE: Multidetector CT imaging of the chest, abdomen and pelvis was performed following the standard protocol without IV contrast.  COMPARISON:  Current chest radiograph  FINDINGS: CT CHEST FINDINGS  Images somewhat degraded by motion. Heart is normal in size. Great vessels are normal in caliber. No neck base, axillary, mediastinal or hilar masses or pathologically enlarged lymph nodes. Endotracheal tube tip lies 2.6 cm above the carina. Right internal jugular central venous line tip lies in the mid mid superior vena cava. Left internal jugular central venous line tip lies at the caval atrial junction. Nasogastric tube passes below the diaphragm into the distal stomach.  Lungs show lower lobe dependent opacity, left greater than right, most consistent with atelectasis. Pneumonia, particularly in the left lower lobe, is possible, however. No pleural effusion is seen.  There is no pulmonary edema. No pneumothorax.  CT ABDOMEN AND PELVIS FINDINGS  Liver is mildly enlarged and shows diffuse fatty infiltration. No liver mass or focal lesion. Spleen is borderline enlarged measuring 13 cm in greatest dimension. Spleen is otherwise unremarkable. Gallbladder surgically absent. Normal pancreas. No bile duct dilation. No adrenal masses. No renal masses, stones or hydronephrosis. Normal ureters. Bladder is decompressed by a Foley catheter.  The uterus and adnexa are unremarkable.  No pathologically enlarged lymph nodes.  Small amount pelvic free fluid, nonspecific. No evidence of an abscess.  Colon and small bowel are unremarkable.  A normal appendix is seen.  There is a catheter in the right external iliac artery extending to the scan in the right inguinal region.  MUSCULOSKELETAL: No osteoblastic or osteolytic lesions. Degenerative changes are noted of the visualized spine.  IMPRESSION: 1. There is dependent opacity in both lower lobes, left greater than right, most likely due to atelectasis. Pneumonia is not excluded but felt less likely. Lungs are otherwise clear. No pulmonary edema. 2. No acute findings below the diaphragm. 3. Mild hepatomegaly with diffuse hepatic steatosis. Borderline splenomegaly. 4. Status post cholecystectomy. 5. Small amount pelvic free fluid, nonspecific. 6. No bowel inflammatory changes.  Normal appendix visualized. 7. Support apparatus is well positioned.   Electronically Signed   By: Amie Portlandavid  Ormond M.D.   On: 10/30/2013 15:43   Ct Chest Wo Contrast  10/30/2013   CLINICAL DATA:  Cough and shortness of breath. High fevers. Sepsis.  EXAM: CT CHEST, ABDOMEN AND PELVIS WITHOUT CONTRAST  TECHNIQUE: Multidetector CT imaging of the chest, abdomen and pelvis was performed following the standard protocol without IV contrast.  COMPARISON:  Current chest radiograph  FINDINGS: CT CHEST FINDINGS  Images somewhat degraded by motion. Heart is normal in size. Great vessels  are normal in caliber. No neck base, axillary, mediastinal or hilar masses or pathologically enlarged lymph nodes. Endotracheal tube tip lies 2.6 cm above the carina. Right internal jugular central venous line tip lies in the mid mid  superior vena cava. Left internal jugular central venous line tip lies at the caval atrial junction. Nasogastric tube passes below the diaphragm into the distal stomach.  Lungs show lower lobe dependent opacity, left greater than right, most consistent with atelectasis. Pneumonia, particularly in the left lower lobe, is possible, however. No pleural effusion is seen. There is no pulmonary edema. No pneumothorax.  CT ABDOMEN AND PELVIS FINDINGS  Liver is mildly enlarged and shows diffuse fatty infiltration. No liver mass or focal lesion. Spleen is borderline enlarged measuring 13 cm in greatest dimension. Spleen is otherwise unremarkable. Gallbladder surgically absent. Normal pancreas. No bile duct dilation. No adrenal masses. No renal masses, stones or hydronephrosis. Normal ureters. Bladder is decompressed by a Foley catheter.  The uterus and adnexa are unremarkable.  No pathologically enlarged lymph nodes.  Small amount pelvic free fluid, nonspecific. No evidence of an abscess.  Colon and small bowel are unremarkable.  A normal appendix is seen.  There is a catheter in the right external iliac artery extending to the scan in the right inguinal region.  MUSCULOSKELETAL: No osteoblastic or osteolytic lesions. Degenerative changes are noted of the visualized spine.  IMPRESSION: 1. There is dependent opacity in both lower lobes, left greater than right, most likely due to atelectasis. Pneumonia is not excluded but felt less likely. Lungs are otherwise clear. No pulmonary edema. 2. No acute findings below the diaphragm. 3. Mild hepatomegaly with diffuse hepatic steatosis. Borderline splenomegaly. 4. Status post cholecystectomy. 5. Small amount pelvic free fluid, nonspecific. 6. No bowel  inflammatory changes.  Normal appendix visualized. 7. Support apparatus is well positioned.   Electronically Signed   By: Amie Portland M.D.   On: 10/30/2013 15:43   Dg Chest Port 1 View  10/31/2013   CLINICAL DATA:  Check endotracheal tube  EXAM: PORTABLE CHEST - 1 VIEW  COMPARISON:  10/30/2013  FINDINGS: Endotracheal tube ends at the mid thoracic trachea. There are bilateral internal jugular vein central lines, which remain in good position. An orogastric tube enters the stomach at least.  No change in heart size or mediastinal contours when accounting for leftward rotation. There is worsening retrocardiac or aeration with the diaphragm less well seen. This likely represents atelectasis, as noted on preceding chest CT. No evidence of edema, effusion, or pneumothorax.  IMPRESSION: 1. Tubes and lines remain in good position. 2. Mild increase in retrocardiac atelectasis.   Electronically Signed   By: Tiburcio Pea M.D.   On: 10/31/2013 06:49   Dg Chest Port 1 View  10/30/2013   CLINICAL DATA:  Post Diatek catheter placement  EXAM: PORTABLE CHEST - 1 VIEW  COMPARISON:  Portable exam 1154 hr compared to 10/30/2013 at 0455 hr  FINDINGS: Tip of endotracheal tube projects 3.9 cm above carina.  RIGHT jugular central venous catheter tip projects over SVC.  New LEFT jugular Diatek catheter with tip projecting over cavoatrial junction.  Normal heart size and mediastinal contours.  Slight pulmonary vascular congestion.  Peribronchial thickening without infiltrate, pleural effusion or pneumothorax.  External artifacts project over chest.  IMPRESSION: Bronchitic changes and slight pulmonary vascular congestion.  No pneumothorax following LEFT jugular line placement.   Electronically Signed   By: Ulyses Southward M.D.   On: 10/30/2013 12:16   Portable Chest Xray In Am  10/30/2013   CLINICAL DATA:  Endotracheal tube.  Central line.  EXAM: PORTABLE CHEST - 1 VIEW  COMPARISON:  Chest x-ray from yesterday  FINDINGS: Endotracheal  tube ends in the mid  thoracic trachea. An orogastric tube crosses the diaphragm. There is a right IJ central line with tip overlapping the SVC origin.  Stable heart size and mediastinal contours. No cardiomegaly. There is shifting bandlike opacities in the lower lungs, now most prominent in the left mid chest. No edema, effusion, or pneumothorax.  IMPRESSION: 1. Stable positioning of tubes and central line. 2. Mild basilar atelectasis.   Electronically Signed   By: Tiburcio Pea M.D.   On: 10/30/2013 05:55   . antiseptic oral rinse  15 mL Mouth Rinse QID  . doxycycline (VIBRAMYCIN) IV  100 mg Intravenous Q12H  . feeding supplement (VITAL HIGH PROTEIN)  1,000 mL Per Tube Q24H  . imipenem-cilastatin  250 mg Intravenous Q6H  . insulin aspart  0-15 Units Subcutaneous 6 times per day  . levETIRAcetam  500 mg Per Tube BID  . pantoprazole (PROTONIX) IV  40 mg Intravenous Q24H  . pneumococcal 23 valent vaccine  0.5 mL Intramuscular Tomorrow-1000  . vancomycin  1,500 mg Intravenous Q48H    BMET    Component Value Date/Time   NA 132* 10/31/2013 0500   K 3.2* 10/31/2013 0500   CL 91* 10/31/2013 0500   CO2 19 10/31/2013 0500   GLUCOSE 197* 10/31/2013 0500   BUN 42* 10/31/2013 0500   CREATININE 2.26* 10/31/2013 0500   CALCIUM 5.6* 10/31/2013 0500   GFRNONAA 21* 10/31/2013 0500   GFRAA 25* 10/31/2013 0500   CBC    Component Value Date/Time   WBC 11.5* 10/31/2013 0400   WBC 4.7 12/10/2012 0855   RBC 3.80* 10/31/2013 0400   RBC 4.59 12/10/2012 0855   HGB 11.5* 10/31/2013 0400   HCT 32.2* 10/31/2013 0400   PLT 42* 10/31/2013 0400   MCV 84.7 10/31/2013 0400   MCH 30.3 10/31/2013 0400   MCH 29.8 12/10/2012 0855   MCHC 35.7 10/31/2013 0400   MCHC 34.1 12/10/2012 0855   RDW 15.6* 10/31/2013 0400   RDW 13.8 12/10/2012 0855   LYMPHSABS 2.1 10/31/2013 0400   LYMPHSABS 1.9 12/10/2012 0855   MONOABS 1.0 10/31/2013 0400   EOSABS 0.1 10/31/2013 0400   EOSABS 0.1 12/10/2012 0855   BASOSABS 0.5* 10/31/2013 0400   BASOSABS 0.0 12/10/2012 0855      Assessment/Plan:  1. AKI in setting of SIRS. oliguric with increasing pressor requirements.  1. initiation of CVVHD without UF on 10/30/13. 2. Keep even and no heparin 3. Improvement of electrolytes 4. Will stop isotonic bicarb and switch to prismasate to help with K levels 2. Metabolic acidosis- secondary to #1  1. Improved with CVVHD and isotonic bicarb 2. Cont to follow electrolytes  3. SIRS- on doxy, imipenem, and vanco and now zosyn. Also on pressors levophed and epi, wean per PCCM 4. VDRF- per PCCM 5. 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 Thrombocytopenia 7. Abnormal LFT's- ?shock liver vs. Ehrlichiosis/rickettsial disease 8. Hyperglycemia/DM- per PCCM 9.   Etsuko Dierolf A

## 2013-10-31 NOTE — Progress Notes (Addendum)
eLink Physician-Brief Progress Note Patient Name: Tracie JasperBrenda B [name] DOB: 01/13/1946 MRN: [id number]  Date of Service  10/31/2013   HPI/Events of Note   Pt with SVT in 150 to 160's.  On levophed and epinephrine infusions.  Also appears agitated.  ECG show a fib with RVR.   eICU Interventions  Will check ECG.  D/c epinephrine.  Try to wean off levophed and transition to phenylephrine.  Will load with amiodarone.  Will give 500 ml fluid bolus.  Will add versed prn for sedation.      Tracie [name] 10/31/2013, 3:50 PM

## 2013-10-31 NOTE — Progress Notes (Addendum)
PULMONARY / CRITICAL CARE MEDICINE   Name: Tracie [name] MRN: [id number] DOB: 1945/08/26    ADMISSION DATE:  10/13/2013 CONSULTATION DATE:  6/39/2015  REFERRING MD :  EDP PRIMARY SERVICE: PCCM  CHIEF COMPLAINT:  SOB  BRIEF PATIENT DESCRIPTION: 68 y.o. F transferred from [place] with septic shock, unclear source. Concern tick borne illness, MODS.  SIGNIFICANT EVENTS / STUDIES:  6/28 - presented to ED with SOB, treated w rocephin for UTI and sent home with levaquin for bronchitis. 6/29 - back to ED in respiratory distress, hypotensive, lactic acidosis, fever to 104.  Transferred to [facility] ICU. 6/30 renal US>>>. Negative for hydronephrosis.  Hepatic steatosis 6/30 echo - Limited study quality but there appears to be normal biventricular size and systolic function. Impaired relaxation with normal filling pressures 6/30 doppler legs bilat>>>neg 7/1- worsening MODS, shock, acidosis 7/1 CT abdo/pelvis>>>neg acute 7/1 Ct chest>>>mild left base atx 7/2- improved levophed  LINES / TUBES: Foley 6/29 >>> 6/29 ETT>>> 6/29 rt [place]>>> 6/29 fem aline>>>  CULTURES: Blood 6/29 >>> Urine 6/29 >>> Respiratory 6/29  >>>rhinovirus positive  RVP 6/29 >>>  Lyme titer>>>  RMSF>>>neg  HIV>>>neg  ANTIBIOTICS: Zosyn 6/29 >>>7/1 Imipenem 7/1>>> Vancomycin 6/29 >>> Doxy 6/29>>>  SUBJECTIVE:  Epi added, levo down  VITAL SIGNS: Temp:  [95.6 F (35.3 C)-102.5 F (39.2 C)] 99.6 F (37.6 C) (07/02 0800) Pulse Rate:  [85-122] 95 (07/02 0800) Resp:  [24-32] 30 (07/02 0800) BP: (67-122)/(40-102) 103/67 mmHg (07/02 0800) SpO2:  [91 %-100 %] 99 % (07/02 0800) Arterial Line BP: (64-133)/(44-67) 98/62 mmHg (07/02 0800) FiO2 (%):  [40 %] 40 % (07/02 0758) Weight:  [111 kg (244 lb 11.4 oz)] 111 kg (244 lb 11.4 oz) (07/02 0330) HEMODYNAMICS: CVP:  [10 mmHg] 10 mmHg VENTILATOR SETTINGS: Vent Mode:  [-] PRVC FiO2 (%):  [40 %] 40 % Set Rate:  [30 bmp] 30 bmp Vt Set:  [550 mL] 550 mL PEEP:  [5 cmH20] 5  cmH20 Plateau Pressure:  [17 cmH20-22 cmH20] 19 cmH20 INTAKE / OUTPUT: Intake/Output     07/01 0701 - 07/02 0700 07/02 0701 - 07/03 0700   I.V. (mL/kg) 7489.4 (67.5) 304.6 (2.7)   Blood 242.5    Other     NG/GT 950 65   IV Piggyback 800    Total Intake(mL/kg) 9481.9 (85.4) 369.6 (3.3)   Urine (mL/kg/hr) 110 (0) 4 (0)   Other 5420 (2) 724 (3.2)   Total Output 5530 728   Net +3951.9 -358.4          PHYSICAL EXAMINATION: General: Elderly appearing female, responsive Neuro: rass -1, moves all ext equal, follows commands, int drowsy, cam pos HEENT: ett, jvd increased Cardiovascular: RRR, no M/R/G.  Lungs: coarse Abdomen: BS low, soft, NT/ND Musculoskeletal: No gross deformities, increased edema. Peripheral pulses +   LABS:  CBC  Recent Labs Lab 10/29/13 1110 10/30/13 0355 10/31/13 0400  WBC 7.8 13.9* 11.5*  HGB 12.6 13.2 11.5*  HCT 37.4 40.4 32.2*  PLT 42* 70* 42*   Coag's  Recent Labs Lab 10/13/2013 2055 10/30/13 0803 10/31/13 0400  APTT 48* 49* >200*  INR 1.49 1.92* 3.22*   BMET  Recent Labs Lab 10/30/13 0803 10/30/13 1225 10/31/13 0500  NA 135* 131* 132*  K 3.9 3.7 3.2*  CL 98 96 91*  CO2 14* 14* 19  BUN 60* 60* 42*  CREATININE 2.81* 2.88* 2.26*  GLUCOSE 176* 211* 197*   Electrolytes  Recent Labs Lab 10/29/13 2158  10/30/13 0500 10/30/13 3903  10/30/13 1225 10/30/13 1701 10/31/13 0500  CALCIUM  --   < >  --  6.2* 5.9*  --  5.6*  MG 1.9  --  2.1  --   --  1.8  --   PHOS 2.5  --  2.6  --  2.4 2.5 1.3*  < > = values in this interval not displayed. Sepsis Markers  Recent Labs Lab 10/06/2013 1955 10/29/13 0020 10/29/13 0500 10/29/13 0556 10/30/13 0355 10/30/13 0945  LATICACIDVEN 5.9* 3.9*  --  4.0*  --  5.7*  PROCALCITON 20.00  --  23.98  --  36.13  --    ABG  Recent Labs Lab 10/30/13 0342 10/30/13 0825 10/31/13 0418  PHART 7.174* 7.185* 7.380  PCO2ART 32.2* 32.2* 33.9*  PO2ART 120.0* 102.0* 66.0*   Liver Enzymes  Recent  Labs Lab 10/12/2013 1140 10/29/13 0500 10/30/13 0355 10/30/13 1225 10/31/13 0500  AST 238* 264* 255*  --   --   ALT 116* 101* 83*  --   --   ALKPHOS 88 84 123*  --   --   BILITOT 1.4* 1.5* 1.5*  --   --   ALBUMIN 2.9* 2.4* 2.0* 1.7* 1.8*   Cardiac Enzymes  Recent Labs Lab 10/03/2013 1140  TROPONINI <0.30  PROBNP 2537.0*   Glucose  Recent Labs Lab 10/30/13 1226 10/30/13 1656 10/30/13 1901 10/30/13 1945 10/30/13 2326 10/31/13 0414  GLUCAP 197* 110* 67* 191* 211* 195*    Imaging Ct Abdomen Pelvis Wo Contrast  10/30/2013   CLINICAL DATA:  Cough and shortness of breath. High fevers. Sepsis.  EXAM: CT CHEST, ABDOMEN AND PELVIS WITHOUT CONTRAST  TECHNIQUE: Multidetector CT imaging of the chest, abdomen and pelvis was performed following the standard protocol without IV contrast.  COMPARISON:  Current chest radiograph  FINDINGS: CT CHEST FINDINGS  Images somewhat degraded by motion. Heart is normal in size. Great vessels are normal in caliber. No neck base, axillary, mediastinal or hilar masses or pathologically enlarged lymph nodes. Endotracheal tube tip lies 2.6 cm above the carina. Right internal jugular central venous line tip lies in the mid mid superior vena cava. Left internal jugular central venous line tip lies at the caval atrial junction. Nasogastric tube passes below the diaphragm into the distal stomach.  Lungs show lower lobe dependent opacity, left greater than right, most consistent with atelectasis. Pneumonia, particularly in the left lower lobe, is possible, however. No pleural effusion is seen. There is no pulmonary edema. No pneumothorax.  CT ABDOMEN AND PELVIS FINDINGS  Liver is mildly enlarged and shows diffuse fatty infiltration. No liver mass or focal lesion. Spleen is borderline enlarged measuring 13 cm in greatest dimension. Spleen is otherwise unremarkable. Gallbladder surgically absent. Normal pancreas. No bile duct dilation. No adrenal masses. No renal masses,  stones or hydronephrosis. Normal ureters. Bladder is decompressed by a Foley catheter.  The uterus and adnexa are unremarkable.  No pathologically enlarged lymph nodes.  Small amount pelvic free fluid, nonspecific. No evidence of an abscess.  Colon and small bowel are unremarkable.  A normal appendix is seen.  There is a catheter in the right external iliac artery extending to the scan in the right inguinal region.  MUSCULOSKELETAL: No osteoblastic or osteolytic lesions. Degenerative changes are noted of the visualized spine.  IMPRESSION: 1. There is dependent opacity in both lower lobes, left greater than right, most likely due to atelectasis. Pneumonia is not excluded but felt less likely. Lungs are otherwise clear. No pulmonary edema. 2. No  acute findings below the diaphragm. 3. Mild hepatomegaly with diffuse hepatic steatosis. Borderline splenomegaly. 4. Status post cholecystectomy. 5. Small amount pelvic free fluid, nonspecific. 6. No bowel inflammatory changes.  Normal appendix visualized. 7. Support apparatus is well positioned.   Electronically Signed   By: Lajean Manes M.D.   On: 10/30/2013 15:43   Ct Chest Wo Contrast  10/30/2013   CLINICAL DATA:  Cough and shortness of breath. High fevers. Sepsis.  EXAM: CT CHEST, ABDOMEN AND PELVIS WITHOUT CONTRAST  TECHNIQUE: Multidetector CT imaging of the chest, abdomen and pelvis was performed following the standard protocol without IV contrast.  COMPARISON:  Current chest radiograph  FINDINGS: CT CHEST FINDINGS  Images somewhat degraded by motion. Heart is normal in size. Great vessels are normal in caliber. No neck base, axillary, mediastinal or hilar masses or pathologically enlarged lymph nodes. Endotracheal tube tip lies 2.6 cm above the carina. Right internal jugular central venous line tip lies in the mid mid superior vena cava. Left internal jugular central venous line tip lies at the caval atrial junction. Nasogastric tube passes below the diaphragm into  the distal stomach.  Lungs show lower lobe dependent opacity, left greater than right, most consistent with atelectasis. Pneumonia, particularly in the left lower lobe, is possible, however. No pleural effusion is seen. There is no pulmonary edema. No pneumothorax.  CT ABDOMEN AND PELVIS FINDINGS  Liver is mildly enlarged and shows diffuse fatty infiltration. No liver mass or focal lesion. Spleen is borderline enlarged measuring 13 cm in greatest dimension. Spleen is otherwise unremarkable. Gallbladder surgically absent. Normal pancreas. No bile duct dilation. No adrenal masses. No renal masses, stones or hydronephrosis. Normal ureters. Bladder is decompressed by a Foley catheter.  The uterus and adnexa are unremarkable.  No pathologically enlarged lymph nodes.  Small amount pelvic free fluid, nonspecific. No evidence of an abscess.  Colon and small bowel are unremarkable.  A normal appendix is seen.  There is a catheter in the right external iliac artery extending to the scan in the right inguinal region.  MUSCULOSKELETAL: No osteoblastic or osteolytic lesions. Degenerative changes are noted of the visualized spine.  IMPRESSION: 1. There is dependent opacity in both lower lobes, left greater than right, most likely due to atelectasis. Pneumonia is not excluded but felt less likely. Lungs are otherwise clear. No pulmonary edema. 2. No acute findings below the diaphragm. 3. Mild hepatomegaly with diffuse hepatic steatosis. Borderline splenomegaly. 4. Status post cholecystectomy. 5. Small amount pelvic free fluid, nonspecific. 6. No bowel inflammatory changes.  Normal appendix visualized. 7. Support apparatus is well positioned.   Electronically Signed   By: Lajean Manes M.D.   On: 10/30/2013 15:43   Dg Chest Port 1 View  10/31/2013   CLINICAL DATA:  Check endotracheal tube  EXAM: PORTABLE CHEST - 1 VIEW  COMPARISON:  10/30/2013  FINDINGS: Endotracheal tube ends at the mid thoracic trachea. There are bilateral  internal jugular vein central lines, which remain in good position. An orogastric tube enters the stomach at least.  No change in heart size or mediastinal contours when accounting for leftward rotation. There is worsening retrocardiac or aeration with the diaphragm less well seen. This likely represents atelectasis, as noted on preceding chest CT. No evidence of edema, effusion, or pneumothorax.  IMPRESSION: 1. Tubes and lines remain in good position. 2. Mild increase in retrocardiac atelectasis.   Electronically Signed   By: Jorje Guild M.D.   On: 10/31/2013 06:49   Dg Chest  Port 1 View  10/30/2013   CLINICAL DATA:  Post Diatek catheter placement  EXAM: PORTABLE CHEST - 1 VIEW  COMPARISON:  Portable exam 1154 hr compared to 10/30/2013 at 0455 hr  FINDINGS: Tip of endotracheal tube projects 3.9 cm above carina.  RIGHT jugular central venous catheter tip projects over SVC.  New LEFT jugular Diatek catheter with tip projecting over cavoatrial junction.  Normal heart size and mediastinal contours.  Slight pulmonary vascular congestion.  Peribronchial thickening without infiltrate, pleural effusion or pneumothorax.  External artifacts project over chest.  IMPRESSION: Bronchitic changes and slight pulmonary vascular congestion.  No pneumothorax following LEFT jugular line placement.   Electronically Signed   By: Lavonia Dana M.D.   On: 10/30/2013 12:16   Portable Chest Xray In Am  10/30/2013   CLINICAL DATA:  Endotracheal tube.  Central line.  EXAM: PORTABLE CHEST - 1 VIEW  COMPARISON:  Chest x-ray from yesterday  FINDINGS: Endotracheal tube ends in the mid thoracic trachea. An orogastric tube crosses the diaphragm. There is a right IJ central line with tip overlapping the SVC origin.  Stable heart size and mediastinal contours. No cardiomegaly. There is shifting bandlike opacities in the lower lungs, now most prominent in the left mid chest. No edema, effusion, or pneumothorax.  IMPRESSION: 1. Stable positioning  of tubes and central line. 2. Mild basilar atelectasis.   Electronically Signed   By: Jorje Guild M.D.   On: 10/30/2013 05:55     ASSESSMENT / PLAN:  PULMONARY A: Acute Respiratory Failure Compensated respiratory alkalosis OSA R/o developing PNA Doppler legs neg, normal RV, low clinical suspicion for PE P:   -ABg reviewed, Ph improved, slight rate reduction 26, repeat abg -NO SBT with high MV needs -pcxr in am for ett -on going cvvhd important  CARDIOVASCULAR A:  Septic Shock -refractory Hypovolemia fuirther today 7/1 No rel AI P:  - Epi to remain, she made major improvement with addition -goal to levophed to 15 mics then taper epi -maintain vaso -goal ph met -cvp trending 10, allow pos balance further, was pos also 4 liters and tolerated well, no pulm edema, improved pressors -trop repeat on epi, ecg   RENAL A:   AG metabolic acidosis - lactate. Smaller NON AG  AKI - SCr improved from 6/28. Hypocalcemia even after correction P:   -cvvhd life saving, maintain -bicarb drip has improved with, maintain -frequent chem -reorder ca  GASTROINTESTINAL A:   Nutrition Transaminitis - sepsis,. Tick borne illness CT reasuring no ischemia P:   - tolerating TF -ppi -lft to follow further today -BM noted 7./1, monitor further  HEMATOLOGIC A:  DIC Thrombocytopenia -DIC No bleeding noted P:  - VTE Proph:  SCD's only. - lamictal held for lowplat -cbc in am with fibrinogen repeat -would Transfuse cryo if bleeding noted -will add vit K 10 mg x 1, did have some elevation lft -coags repeat in am -repeat fibrinogen -may need heme consult  INFECTIOUS A:   Septic Shock UTI - resolved (treated 6/28). DIC association? Tick bite 10 days ago, r/o tick born illness Rhinovirus P:   - maintain imi, doxy, vanc -ID following -clinically improved today -I doubt Rhino virus is pathogen, will discuss ribavirin with ID utility?  ENDOCRINE A:   No evidence AI P:    -ssi, cbg  NEUROLOGIC A:   Hx of Seizures Acute encephalopathy -lamictal held for low plat Delirium ([place] [place] Enrolment) P:   - Hold outpatient Lamictal (see DERM section below). -  Keppra - fent with [facility] -I agree with [facility] [place] Enrollment -Cam scroring  DERMATOLOGY A: Oral lesions- r/o early SJS R/o tic born illness  petachia P: - No Bactrim, hold Lamictal. - doxy  Summary - Improved BP, pressors ph, cvvhd to continue  I have personally obtained a history, examined the patient, evaluated laboratory and imaging results, formulated the assessment and plan and placed orders. CRITICAL CARE: The patient is critically ill with multiple organ systems failure and requires high complexity decision making for assessment and support, frequent evaluation and titration of therapies, application of advanced monitoring technologies and extensive interpretation of multiple databases. Critical Care Time devoted to patient care services described in this note is 45 minutes.   Lavon Paganini. Titus Mould, MD, [place] Pgr: [facility]

## 2013-10-31 NOTE — Progress Notes (Signed)
  Amiodarone Drug - Drug Interaction Consult Note  Recommendations: Monitor patient closely on the MIND study comparing haldol to ziprasidone in the ICU patient.  Amiodarone is metabolized by the cytochrome P450 system and therefore has the potential to cause many drug interactions. Amiodarone has an average plasma half-life of 50 days (range 20 to 100 days).   There is potential for drug interactions to occur several weeks or months after stopping treatment and the onset of drug interactions may be slow after initiating amiodarone.   [x]  Drugs that prolong the QT interval:  Torsades de pointes risk may be increased with concurrent use - avoid if possible.  Monitor QTc, also keep magnesium/potassium WNL if concurrent therapy can't be avoided.  . Antipsychotics: e.g. phenothiazines, haloperidol.     Thank You,  Tracie [name], Tracie [name]  10/31/2013 4:01 PM

## 2013-10-31 NOTE — Progress Notes (Signed)
[facility]  Date of Admission:  10/29/2013  Antibiotics: Vancomycin day 4 Imipenem day 2 Doxycycline day 4 Zosyn 3 days  Subjective: Remains intubated  Objective: Temp:  [95.6 F (35.3 C)-102.3 F (39.1 C)] 100 F (37.8 C) (07/02 1000) Pulse Rate:  [85-122] 91 (07/02 1000) Resp:  [24-32] 26 (07/02 1000) BP: (67-122)/(40-102) 76/45 mmHg (07/02 1000) SpO2:  [91 %-100 %] 96 % (07/02 1000) Arterial Line BP: (64-133)/(44-67) 98/62 mmHg (07/02 0800) FiO2 (%):  [40 %] 40 % (07/02 0758) Weight:  [244 lb 11.4 oz (111 kg)] 244 lb 11.4 oz (111 kg) (07/02 0330)  General: awake, mildly agitated Skin: + rash Lungs: CTA B, anterior exam Cor: Tachy RR Abdomen: soft, nt, nd Ext: no edema  Lab Results Lab Results  Component Value Date   WBC 11.5* 10/31/2013   HGB 11.5* 10/31/2013   HCT 32.2* 10/31/2013   MCV 84.7 10/31/2013   PLT 42* 10/31/2013    Lab Results  Component Value Date   CREATININE 2.26* 10/31/2013   BUN 42* 10/31/2013   NA 132* 10/31/2013   K 3.2* 10/31/2013   CL 91* 10/31/2013   CO2 19 10/31/2013    Lab Results  Component Value Date   ALT 83* 10/30/2013   AST 255* 10/30/2013   ALKPHOS 123* 10/30/2013   BILITOT 1.5* 10/30/2013      Microbiology: Recent Results (from the past 240 hour(s))  URINE CULTURE     Status: None   Collection Time    10/27/13 12:36 PM      Result Value Ref Range Status   Specimen Description URINE, CLEAN CATCH   Final   Special Requests NONE   Final   Culture  Setup Time     Final   Value: 10/07/2013 00:06     Performed at [facility]     Final   Value: NO GROWTH     Performed at [facility]   Culture     Final   Value: NO GROWTH     Performed at [facility]   Report Status 10/29/2013 FINAL   Final  CULTURE, BLOOD (ROUTINE X 2)     Status: None   Collection Time    10/27/2013 12:00 PM      Result Value Ref Range Status   Specimen Description BLOOD RIGHT ANTECUBITAL   Final   Special Requests     Final   Value: BOTTLES DRAWN AEROBIC AND ANAEROBIC AEB=12CC ANA=10CC   Culture  Setup Time     Final   Value: 10/30/2013 14:13     Performed at [facility]   Culture     Final   Value:        BLOOD CULTURE RECEIVED NO GROWTH TO DATE CULTURE WILL BE HELD FOR 5 DAYS BEFORE ISSUING A FINAL NEGATIVE REPORT     Performed at [facility]   Report Status PENDING   Incomplete  CULTURE, BLOOD (ROUTINE X 2)     Status: None   Collection Time    10/18/2013 12:25 PM      Result Value Ref Range Status   Specimen Description BLOOD LEFT HAND   Final   Special Requests BOTTLES DRAWN AEROBIC ONLY [facility]   Final   Culture  Setup Time     Final   Value: 10/30/2013 14:14     Performed at [facility]  Final   Value:        BLOOD CULTURE RECEIVED NO GROWTH TO DATE CULTURE WILL BE HELD FOR 5 DAYS BEFORE ISSUING A FINAL NEGATIVE REPORT     Performed at [facility]   Report Status PENDING   Incomplete  URINE CULTURE     Status: None   Collection Time    10/10/2013 12:36 PM      Result Value Ref Range Status   Specimen Description URINE, CATHETERIZED   Final   Special Requests NONE   Final   Culture  Setup Time     Final   Value: 10/12/2013 23:20     Performed at [place]     Final   Value: NO GROWTH     Performed at [facility]   Culture     Final   Value: NO GROWTH     Performed at [facility]   Report Status 10/29/2013 FINAL   Final  URINE CULTURE     Status: None   Collection Time    10/01/2013  6:19 PM      Result Value Ref Range Status   Specimen Description URINE, CATHETERIZED   Final   Special Requests Normal   Final   Culture  Setup Time     Final   Value: 10/13/2013 19:10     Performed at [place]     Final   Value: NO GROWTH     Performed at [facility]   Culture     Final   Value: NO GROWTH     Performed at [facility]   Report  Status 10/29/2013 FINAL   Final  MRSA PCR SCREENING     Status: None   Collection Time    10/11/2013  6:19 PM      Result Value Ref Range Status   MRSA by PCR NEGATIVE  NEGATIVE Final   Comment:            The GeneXpert MRSA Assay (FDA     approved for NASAL specimens     only), is one component of a     comprehensive MRSA colonization     surveillance program. It is not     intended to diagnose MRSA     infection nor to guide or     monitor treatment for     MRSA infections.  CULTURE, BLOOD (ROUTINE X 2)     Status: None   Collection Time    10/14/2013  7:55 PM      Result Value Ref Range Status   Specimen Description BLOOD LEFT HAND   Final   Special Requests BOTTLES DRAWN AEROBIC ONLY 1CC   Final   Culture  Setup Time     Final   Value: 10/27/2013 23:16     Performed at [facility]   Culture     Final   Value:        BLOOD CULTURE RECEIVED NO GROWTH TO DATE CULTURE WILL BE HELD FOR 5 DAYS BEFORE ISSUING A FINAL NEGATIVE REPORT     Performed at [facility]   Report Status PENDING   Incomplete  RESPIRATORY VIRUS PANEL     Status: Abnormal   Collection Time    10/16/2013  8:01 PM      Result Value Ref Range Status   Source - [facility] NASAL SWAB   Corrected  Comment: CORRECTED ON 07/01 AT 1356: PREVIOUSLY REPORTED AS NASAL SWAB   Respiratory Syncytial Virus A NOT DETECTED   Final   Respiratory Syncytial Virus B NOT DETECTED   Final   Influenza A NOT DETECTED   Final   Influenza B NOT DETECTED   Final   Parainfluenza 1 NOT DETECTED   Final   Parainfluenza 2 NOT DETECTED   Final   Parainfluenza 3 NOT DETECTED   Final   Metapneumovirus NOT DETECTED   Final   Rhinovirus DETECTED (*)  Final   Adenovirus NOT DETECTED   Final   Influenza A H1 NOT DETECTED   Final   Influenza A H3 NOT DETECTED   Final   Comment: (NOTE)           Normal Reference Range for each Analyte: NOT DETECTED     Testing performed using the Luminex xTAG Respiratory Viral Panel test     kit.      This test was developed and its performance characteristics determined     by [facility]. It has not been cleared or approved by the [place]     Food and Drug Administration. This test is used for clinical purposes.     It should not be regarded as investigational or for research. This     laboratory is certified under the [place] (CLIA) as qualified to perform high complexity     clinical laboratory testing.     Performed at [facility], BLOOD (ROUTINE X 2)     Status: None   Collection Time    09/30/2013  8:13 PM      Result Value Ref Range Status   Specimen Description BLOOD LEFT FOREARM   Final   Special Requests BOTTLES DRAWN AEROBIC ONLY 3CC   Final   Culture  Setup Time     Final   Value: 09/30/2013 23:16     Performed at [facility]   Culture     Final   Value:        BLOOD CULTURE RECEIVED NO GROWTH TO DATE CULTURE WILL BE HELD FOR 5 DAYS BEFORE ISSUING A FINAL NEGATIVE REPORT     Performed at [facility]   Report Status PENDING   Incomplete    Studies/Results: Ct Abdomen Pelvis Wo Contrast  10/30/2013   CLINICAL DATA:  Cough and shortness of breath. High fevers. Sepsis.  EXAM: CT CHEST, ABDOMEN AND PELVIS WITHOUT CONTRAST  TECHNIQUE: Multidetector CT imaging of the chest, abdomen and pelvis was performed following the standard protocol without IV contrast.  COMPARISON:  Current chest radiograph  FINDINGS: CT CHEST FINDINGS  Images somewhat degraded by motion. Heart is normal in size. Great vessels are normal in caliber. No neck base, axillary, mediastinal or hilar masses or pathologically enlarged lymph nodes. Endotracheal tube tip lies 2.6 cm above the carina. Right internal jugular central venous line tip lies in the mid mid superior vena cava. Left internal jugular central venous line tip lies at the caval atrial junction. Nasogastric tube passes below the diaphragm into the distal stomach.   Lungs show lower lobe dependent opacity, left greater than right, most consistent with atelectasis. Pneumonia, particularly in the left lower lobe, is possible, however. No pleural effusion is seen. There is no pulmonary edema. No pneumothorax.  CT ABDOMEN AND PELVIS FINDINGS  Liver is mildly enlarged and shows diffuse fatty infiltration. No liver mass or  focal lesion. Spleen is borderline enlarged measuring 13 cm in greatest dimension. Spleen is otherwise unremarkable. Gallbladder surgically absent. Normal pancreas. No bile duct dilation. No adrenal masses. No renal masses, stones or hydronephrosis. Normal ureters. Bladder is decompressed by a Foley catheter.  The uterus and adnexa are unremarkable.  No pathologically enlarged lymph nodes.  Small amount pelvic free fluid, nonspecific. No evidence of an abscess.  Colon and small bowel are unremarkable.  A normal appendix is seen.  There is a catheter in the right external iliac artery extending to the scan in the right inguinal region.  MUSCULOSKELETAL: No osteoblastic or osteolytic lesions. Degenerative changes are noted of the visualized spine.  IMPRESSION: 1. There is dependent opacity in both lower lobes, left greater than right, most likely due to atelectasis. Pneumonia is not excluded but felt less likely. Lungs are otherwise clear. No pulmonary edema. 2. No acute findings below the diaphragm. 3. Mild hepatomegaly with diffuse hepatic steatosis. Borderline splenomegaly. 4. Status post cholecystectomy. 5. Small amount pelvic free fluid, nonspecific. 6. No bowel inflammatory changes.  Normal appendix visualized. 7. Support apparatus is well positioned.   Electronically Signed   By: Lajean Manes M.D.   On: 10/30/2013 15:43   Ct Chest Wo Contrast  10/30/2013   CLINICAL DATA:  Cough and shortness of breath. High fevers. Sepsis.  EXAM: CT CHEST, ABDOMEN AND PELVIS WITHOUT CONTRAST  TECHNIQUE: Multidetector CT imaging of the chest, abdomen and pelvis was performed  following the standard protocol without IV contrast.  COMPARISON:  Current chest radiograph  FINDINGS: CT CHEST FINDINGS  Images somewhat degraded by motion. Heart is normal in size. Great vessels are normal in caliber. No neck base, axillary, mediastinal or hilar masses or pathologically enlarged lymph nodes. Endotracheal tube tip lies 2.6 cm above the carina. Right internal jugular central venous line tip lies in the mid mid superior vena cava. Left internal jugular central venous line tip lies at the caval atrial junction. Nasogastric tube passes below the diaphragm into the distal stomach.  Lungs show lower lobe dependent opacity, left greater than right, most consistent with atelectasis. Pneumonia, particularly in the left lower lobe, is possible, however. No pleural effusion is seen. There is no pulmonary edema. No pneumothorax.  CT ABDOMEN AND PELVIS FINDINGS  Liver is mildly enlarged and shows diffuse fatty infiltration. No liver mass or focal lesion. Spleen is borderline enlarged measuring 13 cm in greatest dimension. Spleen is otherwise unremarkable. Gallbladder surgically absent. Normal pancreas. No bile duct dilation. No adrenal masses. No renal masses, stones or hydronephrosis. Normal ureters. Bladder is decompressed by a Foley catheter.  The uterus and adnexa are unremarkable.  No pathologically enlarged lymph nodes.  Small amount pelvic free fluid, nonspecific. No evidence of an abscess.  Colon and small bowel are unremarkable.  A normal appendix is seen.  There is a catheter in the right external iliac artery extending to the scan in the right inguinal region.  MUSCULOSKELETAL: No osteoblastic or osteolytic lesions. Degenerative changes are noted of the visualized spine.  IMPRESSION: 1. There is dependent opacity in both lower lobes, left greater than right, most likely due to atelectasis. Pneumonia is not excluded but felt less likely. Lungs are otherwise clear. No pulmonary edema. 2. No acute  findings below the diaphragm. 3. Mild hepatomegaly with diffuse hepatic steatosis. Borderline splenomegaly. 4. Status post cholecystectomy. 5. Small amount pelvic free fluid, nonspecific. 6. No bowel inflammatory changes.  Normal appendix visualized. 7. Support apparatus is well positioned.  Electronically Signed   By: Lajean Manes M.D.   On: 10/30/2013 15:43   Dg Chest Port 1 View  10/31/2013   CLINICAL DATA:  Check endotracheal tube  EXAM: PORTABLE CHEST - 1 VIEW  COMPARISON:  10/30/2013  FINDINGS: Endotracheal tube ends at the mid thoracic trachea. There are bilateral internal jugular vein central lines, which remain in good position. An orogastric tube enters the stomach at least.  No change in heart size or mediastinal contours when accounting for leftward rotation. There is worsening retrocardiac or aeration with the diaphragm less well seen. This likely represents atelectasis, as noted on preceding chest CT. No evidence of edema, effusion, or pneumothorax.  IMPRESSION: 1. Tubes and lines remain in good position. 2. Mild increase in retrocardiac atelectasis.   Electronically Signed   By: Jorje Guild M.D.   On: 10/31/2013 06:49   Dg Chest Port 1 View  10/30/2013   CLINICAL DATA:  Post Diatek catheter placement  EXAM: PORTABLE CHEST - 1 VIEW  COMPARISON:  Portable exam 1154 hr compared to 10/30/2013 at 0455 hr  FINDINGS: Tip of endotracheal tube projects 3.9 cm above carina.  RIGHT jugular central venous catheter tip projects over SVC.  New LEFT jugular Diatek catheter with tip projecting over cavoatrial junction.  Normal heart size and mediastinal contours.  Slight pulmonary vascular congestion.  Peribronchial thickening without infiltrate, pleural effusion or pneumothorax.  External artifacts project over chest.  IMPRESSION: Bronchitic changes and slight pulmonary vascular congestion.  No pneumothorax following LEFT jugular line placement.   Electronically Signed   By: Lavonia Dana M.D.   On:  10/30/2013 12:16   Portable Chest Xray In Am  10/30/2013   CLINICAL DATA:  Endotracheal tube.  Central line.  EXAM: PORTABLE CHEST - 1 VIEW  COMPARISON:  Chest x-ray from yesterday  FINDINGS: Endotracheal tube ends in the mid thoracic trachea. An orogastric tube crosses the diaphragm. There is a right IJ central line with tip overlapping the SVC origin.  Stable heart size and mediastinal contours. No cardiomegaly. There is shifting bandlike opacities in the lower lungs, now most prominent in the left mid chest. No edema, effusion, or pneumothorax.  IMPRESSION: 1. Stable positioning of tubes and central line. 2. Mild basilar atelectasis.   Electronically Signed   By: Jorje Guild M.D.   On: 10/30/2013 05:55    Assessment/Plan: 1) SIRS - no etiology identified.  RMSF is negative IgM, will need convalescent serology in about 2 weeks to confirm.  Doubt other tick borne etiologies.  Rhinovirus not typically going to cause SIRS.  Bacterial etiology most likely.  Continue with broad spectrum antibiotics.  Pressors weaned off.    2) Thrombocytopenia - no previous Plts to compare, has been present on admission.  May be due to #1, tick illness, chronic.  Has fatty liver, mild splenomegaly.  May have some underlying chronic abnormality.  No known alcohol history.    3) transaminitis - ? Fatty liver, shock liver, ? Chronic.  Will monitor.    Scharlene Gloss, [facility] [place] [email or web address] [phone] pager   [phone] cell 10/31/2013, 10:44 AM

## 2013-11-01 ENCOUNTER — Inpatient Hospital Stay (HOSPITAL_COMMUNITY): Payer: Medicare HMO

## 2013-11-01 LAB — BLOOD GAS, ARTERIAL
ACID-BASE DEFICIT: 0.3 mmol/L (ref 0.0–2.0)
ACID-BASE EXCESS: 2 mmol/L (ref 0.0–2.0)
Bicarbonate: 25.2 mEq/L — ABNORMAL HIGH (ref 20.0–24.0)
Bicarbonate: 25.9 mEq/L — ABNORMAL HIGH (ref 20.0–24.0)
Drawn by: 31101
Drawn by: 41977
FIO2: 0.4 %
FIO2: 0.5 %
MECHVT: 550 mL
O2 SAT: 96.9 %
O2 Saturation: 88.3 %
PATIENT TEMPERATURE: 98.6
PEEP: 5 cmH2O
PEEP: 5 cmH2O
PH ART: 7.312 — AB (ref 7.350–7.450)
PO2 ART: 91.3 mmHg (ref 80.0–100.0)
Patient temperature: 98.6
RATE: 20 resp/min
RATE: 24 resp/min
TCO2: 26.7 mmol/L (ref 0–100)
TCO2: 27.1 mmol/L (ref 0–100)
VT: 550 mL
pCO2 arterial: 51.2 mmHg — ABNORMAL HIGH (ref 35.0–45.0)
pCO2 arterial: 39.4 mmHg (ref 35.0–45.0)
pH, Arterial: 7.434 (ref 7.350–7.450)
pO2, Arterial: 59.7 mmHg — ABNORMAL LOW (ref 80.0–100.0)

## 2013-11-01 LAB — CBC WITH DIFFERENTIAL/PLATELET
BASOS PCT: 3 % — AB (ref 0–1)
Basophils Absolute: 0.3 10*3/uL — ABNORMAL HIGH (ref 0.0–0.1)
Eosinophils Absolute: 0.1 10*3/uL (ref 0.0–0.7)
Eosinophils Relative: 1 % (ref 0–5)
HCT: 31.6 % — ABNORMAL LOW (ref 36.0–46.0)
HEMOGLOBIN: 10.8 g/dL — AB (ref 12.0–15.0)
LYMPHS PCT: 22 % (ref 12–46)
Lymphs Abs: 2.3 10*3/uL (ref 0.7–4.0)
MCH: 28.9 pg (ref 26.0–34.0)
MCHC: 34.2 g/dL (ref 30.0–36.0)
MCV: 84.5 fL (ref 78.0–100.0)
MONOS PCT: 13 % — AB (ref 3–12)
Monocytes Absolute: 1.4 10*3/uL — ABNORMAL HIGH (ref 0.1–1.0)
NEUTROS PCT: 61 % (ref 43–77)
Neutro Abs: 6.3 10*3/uL (ref 1.7–7.7)
Platelets: 45 10*3/uL — ABNORMAL LOW (ref 150–400)
RBC: 3.74 MIL/uL — AB (ref 3.87–5.11)
RDW: 15.7 % — ABNORMAL HIGH (ref 11.5–15.5)
WBC: 10.4 10*3/uL (ref 4.0–10.5)

## 2013-11-01 LAB — RENAL FUNCTION PANEL
ALBUMIN: 1.7 g/dL — AB (ref 3.5–5.2)
ALBUMIN: 1.7 g/dL — AB (ref 3.5–5.2)
ANION GAP: 11 (ref 5–15)
Anion gap: 11 (ref 5–15)
BUN: 20 mg/dL (ref 6–23)
BUN: 20 mg/dL (ref 6–23)
CALCIUM: 6.5 mg/dL — AB (ref 8.4–10.5)
CO2: 28 mEq/L (ref 19–32)
CO2: 27 mEq/L (ref 19–32)
CREATININE: 1.51 mg/dL — AB (ref 0.50–1.10)
Calcium: 6.1 mg/dL — CL (ref 8.4–10.5)
Chloride: 94 mEq/L — ABNORMAL LOW (ref 96–112)
Chloride: 96 mEq/L (ref 96–112)
Creatinine, Ser: 1.54 mg/dL — ABNORMAL HIGH (ref 0.50–1.10)
GFR calc Af Amer: 39 mL/min — ABNORMAL LOW (ref >90)
GFR calc Af Amer: 40 mL/min — ABNORMAL LOW (ref >90)
GFR, EST NON AFRICAN AMERICAN: 34 mL/min — AB (ref >90)
GFR, EST NON AFRICAN AMERICAN: 35 mL/min — AB (ref >90)
Glucose, Bld: 179 mg/dL — ABNORMAL HIGH (ref 70–99)
Glucose, Bld: 140 mg/dL — ABNORMAL HIGH (ref 70–99)
PHOSPHORUS: 1.3 mg/dL — AB (ref 2.3–4.6)
POTASSIUM: 3.6 meq/L — AB (ref 3.7–5.3)
Phosphorus: 2 mg/dL — ABNORMAL LOW (ref 2.3–4.6)
Potassium: 3.5 mEq/L — ABNORMAL LOW (ref 3.7–5.3)
Sodium: 133 mEq/L — ABNORMAL LOW (ref 137–147)
Sodium: 134 mEq/L — ABNORMAL LOW (ref 137–147)

## 2013-11-01 LAB — GLUCOSE, CAPILLARY
GLUCOSE-CAPILLARY: 170 mg/dL — AB (ref 70–99)
GLUCOSE-CAPILLARY: 173 mg/dL — AB (ref 70–99)
Glucose-Capillary: 171 mg/dL — ABNORMAL HIGH (ref 70–99)
Glucose-Capillary: 120 mg/dL — ABNORMAL HIGH (ref 70–99)
Glucose-Capillary: 182 mg/dL — ABNORMAL HIGH (ref 70–99)
Glucose-Capillary: 109 mg/dL — ABNORMAL HIGH (ref 70–99)

## 2013-11-01 LAB — COMPREHENSIVE METABOLIC PANEL
ALBUMIN: 1.4 g/dL — AB (ref 3.5–5.2)
ALT: 65 U/L — ABNORMAL HIGH (ref 0–35)
ANION GAP: 13 (ref 5–15)
AST: 201 U/L — ABNORMAL HIGH (ref 0–37)
Alkaline Phosphatase: 179 U/L — ABNORMAL HIGH (ref 39–117)
BUN: 23 mg/dL (ref 6–23)
CO2: 24 mEq/L (ref 19–32)
Calcium: 4.9 mg/dL — CL (ref 8.4–10.5)
Chloride: 101 mEq/L (ref 96–112)
Creatinine, Ser: 1.59 mg/dL — ABNORMAL HIGH (ref 0.50–1.10)
GFR calc non Af Amer: 33 mL/min — ABNORMAL LOW (ref >90)
GFR, EST AFRICAN AMERICAN: 38 mL/min — AB (ref >90)
Glucose, Bld: 145 mg/dL — ABNORMAL HIGH (ref 70–99)
Potassium: 2.8 mEq/L — CL (ref 3.7–5.3)
SODIUM: 138 meq/L (ref 137–147)
TOTAL PROTEIN: 4.3 g/dL — AB (ref 6.0–8.3)
Total Bilirubin: 2.1 mg/dL — ABNORMAL HIGH (ref 0.3–1.2)

## 2013-11-01 LAB — PROTIME-INR
INR: 1.45 (ref 0.00–1.49)
Prothrombin Time: 17.6 seconds — ABNORMAL HIGH (ref 11.6–15.2)

## 2013-11-01 LAB — MAGNESIUM: Magnesium: 1.8 mg/dL (ref 1.5–2.5)

## 2013-11-01 LAB — APTT: aPTT: 37 seconds (ref 24–37)

## 2013-11-01 LAB — PHOSPHORUS: Phosphorus: 0.9 mg/dL — CL (ref 2.3–4.6)

## 2013-11-01 MED ORDER — MAGNESIUM SULFATE IN D5W 10-5 MG/ML-% IV SOLN
1.0000 g | Freq: Once | INTRAVENOUS | Status: AC
  Administered 2013-11-01: 1 g via INTRAVENOUS
  Filled 2013-11-01 (×2): qty 100

## 2013-11-01 MED ORDER — FENTANYL CITRATE 0.05 MG/ML IJ SOLN: 50.0000 ug | Freq: Once | INTRAMUSCULAR | Status: DC

## 2013-11-01 MED ORDER — SODIUM CHLORIDE 0.9 % IV SOLN
0.5000 mg/kg/h | INTRAVENOUS | Status: AC
  Administered 2013-11-01 – 2013-11-02 (×2): 0.5 mg/kg/h via INTRAVENOUS
  Filled 2013-11-01 (×3): qty 100

## 2013-11-01 MED ORDER — WHITE PETROLATUM GEL
Status: AC
  Administered 2013-11-01: 0.2
  Filled 2013-11-01: qty 5

## 2013-11-01 MED ORDER — SODIUM CHLORIDE 0.9 % IV SOLN
0.0000 ug/h | INTRAVENOUS | Status: DC
  Administered 2013-11-03 (×2): 150 ug/h via INTRAVENOUS
  Filled 2013-11-01 (×4): qty 50

## 2013-11-01 MED ORDER — CHLORHEXIDINE GLUCONATE 0.12 % MT SOLN
15.0000 mL | Freq: Two times a day (BID) | OROMUCOSAL | Status: DC
  Administered 2013-11-01 – 2013-11-07 (×14): 15 mL via OROMUCOSAL
  Filled 2013-11-01 (×14): qty 15

## 2013-11-01 MED ORDER — SODIUM CHLORIDE 0.9 % IV SOLN
1.0000 g | Freq: Once | INTRAVENOUS | Status: AC
  Administered 2013-11-01: 1 g via INTRAVENOUS
  Filled 2013-11-01: qty 10

## 2013-11-01 MED ORDER — BIOTENE DRY MOUTH MT LIQD
15.0000 mL | Freq: Four times a day (QID) | OROMUCOSAL | Status: DC
  Administered 2013-11-01 – 2013-11-08 (×28): 15 mL via OROMUCOSAL

## 2013-11-01 MED ORDER — FENTANYL BOLUS VIA INFUSION
25.0000 ug | INTRAVENOUS | Status: DC | PRN
  Filled 2013-11-01: qty 50

## 2013-11-01 MED ORDER — VANCOMYCIN HCL IN DEXTROSE 1-5 GM/200ML-% IV SOLN
1000.0000 mg | INTRAVENOUS | Status: DC
  Administered 2013-11-01 – 2013-11-03 (×3): 1000 mg via INTRAVENOUS
  Filled 2013-11-01 (×4): qty 200

## 2013-11-01 MED ORDER — POTASSIUM PHOSPHATES 15 MMOLE/5ML IV SOLN
40.0000 meq | Freq: Once | INTRAVENOUS | Status: AC
  Administered 2013-11-01: 40 meq via INTRAVENOUS
  Filled 2013-11-01 (×2): qty 9.09

## 2013-11-01 NOTE — Progress Notes (Signed)
[facility]  Date of Admission:  10/12/2013  Antibiotics: Vancomycin day 5 Imipenem day 3 Doxycycline day 5 Zosyn 3 days  Subjective: Remains intubated, on dialysis now  Objective: Temp:  [96.7 F (35.9 C)-99.4 F (37.4 C)] 96.7 F (35.9 C) (07/03 1115) Pulse Rate:  [57-149] 57 (07/03 1115) Resp:  [10-24] 24 (07/03 1115) BP: (78-138)/(46-122) 97/60 mmHg (07/03 1100) SpO2:  [81 %-100 %] 100 % (07/03 1115) Arterial Line BP: (60-166)/(43-90) 95/48 mmHg (07/03 1115) FiO2 (%):  [40 %-50 %] 50 % (07/03 1030) Weight:  [251 lb 15.8 oz (114.3 kg)] 251 lb 15.8 oz (114.3 kg) (07/03 0437)  General: awake, mildly agitated Skin: + rash Lungs: CTA B, anterior exam Cor: Tachy RR Abdomen: soft, nt, nd Ext: no edema  Lab Results Lab Results  Component Value Date   WBC 10.4 11/01/2013   HGB 10.8* 11/01/2013   HCT 31.6* 11/01/2013   MCV 84.5 11/01/2013   PLT 45* 11/01/2013    Lab Results  Component Value Date   CREATININE 1.59* 11/01/2013   BUN 23 11/01/2013   NA 138 11/01/2013   K 2.8* 11/01/2013   CL 101 11/01/2013   CO2 24 11/01/2013    Lab Results  Component Value Date   ALT 65* 11/01/2013   AST 201* 11/01/2013   ALKPHOS 179* 11/01/2013   BILITOT 2.1* 11/01/2013      Microbiology: Recent Results (from the past 240 hour(s))  URINE CULTURE     Status: None   Collection Time    10/27/13 12:36 PM      Result Value Ref Range Status   Specimen Description URINE, CLEAN CATCH   Final   Special Requests NONE   Final   Culture  Setup Time     Final   Value: 10/02/2013 00:06     Performed at [facility]     Final   Value: NO GROWTH     Performed at [facility]   Culture     Final   Value: NO GROWTH     Performed at [facility]   Report Status 10/29/2013 FINAL   Final  CULTURE, BLOOD (ROUTINE X 2)     Status: None   Collection Time    10/18/2013 12:00 PM      Result Value Ref Range Status   Specimen Description BLOOD RIGHT  ANTECUBITAL   Final   Special Requests     Final   Value: BOTTLES DRAWN AEROBIC AND ANAEROBIC AEB=12CC ANA=10CC   Culture  Setup Time     Final   Value: 10/30/2013 14:13     Performed at [facility]   Culture     Final   Value:        BLOOD CULTURE RECEIVED NO GROWTH TO DATE CULTURE WILL BE HELD FOR 5 DAYS BEFORE ISSUING A FINAL NEGATIVE REPORT     Performed at [facility]   Report Status PENDING   Incomplete  CULTURE, BLOOD (ROUTINE X 2)     Status: None   Collection Time    10/17/2013 12:25 PM      Result Value Ref Range Status   Specimen Description BLOOD LEFT HAND   Final   Special Requests BOTTLES DRAWN AEROBIC ONLY [facility]   Final   Culture  Setup Time     Final   Value: 10/30/2013 14:14     Performed at [facility]  Culture     Final   Value:        BLOOD CULTURE RECEIVED NO GROWTH TO DATE CULTURE WILL BE HELD FOR 5 DAYS BEFORE ISSUING A FINAL NEGATIVE REPORT     Performed at [facility]   Report Status PENDING   Incomplete  URINE CULTURE     Status: None   Collection Time    10/14/2013 12:36 PM      Result Value Ref Range Status   Specimen Description URINE, CATHETERIZED   Final   Special Requests NONE   Final   Culture  Setup Time     Final   Value: 10/20/2013 23:20     Performed at [facility]   Colony Count     Final   Value: NO GROWTH     Performed at [facility]   Culture     Final   Value: NO GROWTH     Performed at [facility]   Report Status 10/29/2013 FINAL   Final  URINE CULTURE     Status: None   Collection Time    10/02/2013  6:19 PM      Result Value Ref Range Status   Specimen Description URINE, CATHETERIZED   Final   Special Requests Normal   Final   Culture  Setup Time     Final   Value: 10/24/2013 19:10     Performed at [facility]   Colony Count     Final   Value: NO GROWTH     Performed at [facility]   Culture     Final   Value: NO GROWTH     Performed at [facility]   Report Status 10/29/2013 FINAL   Final  MRSA PCR SCREENING     Status: None   Collection Time    10/02/2013  6:19 PM      Result Value Ref Range Status   MRSA by PCR NEGATIVE  NEGATIVE Final   Comment:            The GeneXpert MRSA Assay (FDA     approved for NASAL specimens     only), is one component of a     comprehensive MRSA colonization     surveillance program. It is not     intended to diagnose MRSA     infection nor to guide or     monitor treatment for     MRSA infections.  CULTURE, BLOOD (ROUTINE X 2)     Status: None   Collection Time    10/22/2013  7:55 PM      Result Value Ref Range Status   Specimen Description BLOOD LEFT HAND   Final   Special Requests BOTTLES DRAWN AEROBIC ONLY 1CC   Final   Culture  Setup Time     Final   Value: 10/17/2013 23:16     Performed at [facility]   Culture     Final   Value:        BLOOD CULTURE RECEIVED NO GROWTH TO DATE CULTURE WILL BE HELD FOR 5 DAYS BEFORE ISSUING A FINAL NEGATIVE REPORT     Performed at [facility]   Report Status PENDING   Incomplete  RESPIRATORY VIRUS PANEL     Status: Abnormal   Collection Time    10/29/2013  8:01 PM      Result Value Ref Range Status   Source - [facility] NASAL   SWAB   Corrected   Comment: CORRECTED ON 07/01 AT 1356: PREVIOUSLY REPORTED AS NASAL SWAB   Respiratory Syncytial Virus A NOT DETECTED   Final   Respiratory Syncytial Virus B NOT DETECTED   Final   Influenza A NOT DETECTED   Final   Influenza B NOT DETECTED   Final   Parainfluenza 1 NOT DETECTED   Final   Parainfluenza 2 NOT DETECTED   Final   Parainfluenza 3 NOT DETECTED   Final   Metapneumovirus NOT DETECTED   Final   Rhinovirus DETECTED (*)  Final   Adenovirus NOT DETECTED   Final   Influenza A H1 NOT DETECTED   Final   Influenza A H3 NOT DETECTED   Final   Comment: (NOTE)           Normal Reference Range for each Analyte: NOT DETECTED     Testing performed using the Luminex xTAG Respiratory Viral  Panel test     kit.     This test was developed and its performance characteristics determined     by [facility]. It has not been cleared or approved by the [place]     Food and Drug Administration. This test is used for clinical purposes.     It should not be regarded as investigational or for research. This     laboratory is certified under the Clinical Laboratory Improvement     Amendments of 1988 (CLIA) as qualified to perform high complexity     clinical laboratory testing.     Performed at [facility]  CULTURE, BLOOD (ROUTINE X 2)     Status: None   Collection Time    10/27/2013  8:13 PM      Result Value Ref Range Status   Specimen Description BLOOD LEFT FOREARM   Final   Special Requests BOTTLES DRAWN AEROBIC ONLY 3CC   Final   Culture  Setup Time     Final   Value: 10/14/2013 23:16     Performed at [facility]   Culture     Final   Value:        BLOOD CULTURE RECEIVED NO GROWTH TO DATE CULTURE WILL BE HELD FOR 5 DAYS BEFORE ISSUING A FINAL NEGATIVE REPORT     Performed at [facility]   Report Status PENDING   Incomplete    Studies/Results: Ct Abdomen Pelvis Wo Contrast  10/30/2013   CLINICAL DATA:  Cough and shortness of breath. High fevers. Sepsis.  EXAM: CT CHEST, ABDOMEN AND PELVIS WITHOUT CONTRAST  TECHNIQUE: Multidetector CT imaging of the chest, abdomen and pelvis was performed following the standard protocol without IV contrast.  COMPARISON:  Current chest radiograph  FINDINGS: CT CHEST FINDINGS  Images somewhat degraded by motion. Heart is normal in size. Great vessels are normal in caliber. No neck base, axillary, mediastinal or hilar masses or pathologically enlarged lymph nodes. Endotracheal tube tip lies 2.6 cm above the carina. Right internal jugular central venous line tip lies in the mid mid superior vena cava. Left internal jugular central venous line tip lies at the caval atrial junction. Nasogastric tube passes below the diaphragm  into the distal stomach.  Lungs show lower lobe dependent opacity, left greater than right, most consistent with atelectasis. Pneumonia, particularly in the left lower lobe, is possible, however. No pleural effusion is seen. There is no pulmonary edema. No pneumothorax.  CT ABDOMEN AND PELVIS FINDINGS  Liver is mildly enlarged and shows diffuse   fatty infiltration. No liver mass or focal lesion. Spleen is borderline enlarged measuring 13 cm in greatest dimension. Spleen is otherwise unremarkable. Gallbladder surgically absent. Normal pancreas. No bile duct dilation. No adrenal masses. No renal masses, stones or hydronephrosis. Normal ureters. Bladder is decompressed by a Foley catheter.  The uterus and adnexa are unremarkable.  No pathologically enlarged lymph nodes.  Small amount pelvic free fluid, nonspecific. No evidence of an abscess.  Colon and small bowel are unremarkable.  A normal appendix is seen.  There is a catheter in the right external iliac artery extending to the scan in the right inguinal region.  MUSCULOSKELETAL: No osteoblastic or osteolytic lesions. Degenerative changes are noted of the visualized spine.  IMPRESSION: 1. There is dependent opacity in both lower lobes, left greater than right, most likely due to atelectasis. Pneumonia is not excluded but felt less likely. Lungs are otherwise clear. No pulmonary edema. 2. No acute findings below the diaphragm. 3. Mild hepatomegaly with diffuse hepatic steatosis. Borderline splenomegaly. 4. Status post cholecystectomy. 5. Small amount pelvic free fluid, nonspecific. 6. No bowel inflammatory changes.  Normal appendix visualized. 7. Support apparatus is well positioned.   Electronically Signed   By: David  Ormond M.D.   On: 10/30/2013 15:43   Ct Chest Wo Contrast  10/30/2013   CLINICAL DATA:  Cough and shortness of breath. High fevers. Sepsis.  EXAM: CT CHEST, ABDOMEN AND PELVIS WITHOUT CONTRAST  TECHNIQUE: Multidetector CT imaging of the chest,  abdomen and pelvis was performed following the standard protocol without IV contrast.  COMPARISON:  Current chest radiograph  FINDINGS: CT CHEST FINDINGS  Images somewhat degraded by motion. Heart is normal in size. Great vessels are normal in caliber. No neck base, axillary, mediastinal or hilar masses or pathologically enlarged lymph nodes. Endotracheal tube tip lies 2.6 cm above the carina. Right internal jugular central venous line tip lies in the mid mid superior vena cava. Left internal jugular central venous line tip lies at the caval atrial junction. Nasogastric tube passes below the diaphragm into the distal stomach.  Lungs show lower lobe dependent opacity, left greater than right, most consistent with atelectasis. Pneumonia, particularly in the left lower lobe, is possible, however. No pleural effusion is seen. There is no pulmonary edema. No pneumothorax.  CT ABDOMEN AND PELVIS FINDINGS  Liver is mildly enlarged and shows diffuse fatty infiltration. No liver mass or focal lesion. Spleen is borderline enlarged measuring 13 cm in greatest dimension. Spleen is otherwise unremarkable. Gallbladder surgically absent. Normal pancreas. No bile duct dilation. No adrenal masses. No renal masses, stones or hydronephrosis. Normal ureters. Bladder is decompressed by a Foley catheter.  The uterus and adnexa are unremarkable.  No pathologically enlarged lymph nodes.  Small amount pelvic free fluid, nonspecific. No evidence of an abscess.  Colon and small bowel are unremarkable.  A normal appendix is seen.  There is a catheter in the right external iliac artery extending to the scan in the right inguinal region.  MUSCULOSKELETAL: No osteoblastic or osteolytic lesions. Degenerative changes are noted of the visualized spine.  IMPRESSION: 1. There is dependent opacity in both lower lobes, left greater than right, most likely due to atelectasis. Pneumonia is not excluded but felt less likely. Lungs are otherwise clear. No  pulmonary edema. 2. No acute findings below the diaphragm. 3. Mild hepatomegaly with diffuse hepatic steatosis. Borderline splenomegaly. 4. Status post cholecystectomy. 5. Small amount pelvic free fluid, nonspecific. 6. No bowel inflammatory changes.  Normal appendix visualized. 7.   Support apparatus is well positioned.   Electronically Signed   By: David  Ormond M.D.   On: 10/30/2013 15:43   Dg Chest Port 1 View  11/01/2013   CLINICAL DATA:  Assess endotracheal tube  EXAM: PORTABLE CHEST - 1 VIEW  COMPARISON:  10/31/2013  FINDINGS: The carina is difficult to visualize due to overlapping material. The endotracheal tube appears in similar position however, with the tip at the level of the clavicular heads. Bilateral internal jugular catheters are in stable and good position. The orogastric tube remains at least at the level of the stomach.  Stable heart size and upper mediastinal contours, distorted by leftward rotation. A retrocardiac opacity appears similar to yesterday. By CT from 2 days prior, this was likely atelectasis. No evidence of edema, effusion, or pneumothorax.  IMPRESSION: 1. Stable positioning of tubes and lines. 2. No change in retrocardiac atelectasis.   Electronically Signed   By: Jonathan  Watts M.D.   On: 11/01/2013 06:53   Dg Chest Port 1 View  10/31/2013   CLINICAL DATA:  Check endotracheal tube  EXAM: PORTABLE CHEST - 1 VIEW  COMPARISON:  10/30/2013  FINDINGS: Endotracheal tube ends at the mid thoracic trachea. There are bilateral internal jugular vein central lines, which remain in good position. An orogastric tube enters the stomach at least.  No change in heart size or mediastinal contours when accounting for leftward rotation. There is worsening retrocardiac or aeration with the diaphragm less well seen. This likely represents atelectasis, as noted on preceding chest CT. No evidence of edema, effusion, or pneumothorax.  IMPRESSION: 1. Tubes and lines remain in good position. 2. Mild  increase in retrocardiac atelectasis.   Electronically Signed   By: Jonathan  Watts M.D.   On: 10/31/2013 06:49   Dg Chest Port 1 View  10/30/2013   CLINICAL DATA:  Post Diatek catheter placement  EXAM: PORTABLE CHEST - 1 VIEW  COMPARISON:  Portable exam 1154 hr compared to 10/30/2013 at 0455 hr  FINDINGS: Tip of endotracheal tube projects 3.9 cm above carina.  RIGHT jugular central venous catheter tip projects over SVC.  New LEFT jugular Diatek catheter with tip projecting over cavoatrial junction.  Normal heart size and mediastinal contours.  Slight pulmonary vascular congestion.  Peribronchial thickening without infiltrate, pleural effusion or pneumothorax.  External artifacts project over chest.  IMPRESSION: Bronchitic changes and slight pulmonary vascular congestion.  No pneumothorax following LEFT jugular line placement.   Electronically Signed   By: Mark  Boles M.D.   On: 10/30/2013 12:16    Assessment/Plan: 1) SIRS - no etiology identified.  RMSF is negative IgM, will need convalescent serology in about 2 weeks to confirm.  Doubt other tick borne etiologies.  Rhinovirus not typically going to cause SIRS.  Bacterial etiology most likely.  No positive cultures.  Will taper off vancomycin, continue imipenem and doxy.  Last fever 7/1.    2) Thrombocytopenia - no previous Plts to compare, has been present on admission.  May be due to #1, tick illness, sepsis vs chronic.  Has fatty liver, mild splenomegaly.  May have some underlying chronic abnormality.  No known alcohol history.    3) transaminitis - ? Fatty liver, shock liver, ? Chronic.  Improved some.      COMER, ROBERT, MD [facility] [email or web address] [phone] pager   [phone] cell 11/01/2013, 11:32 AM 

## 2013-11-01 NOTE — Progress Notes (Signed)
Patient ID: Tracie [name], female   DOB: 01/15/1946, 68 y.o.   MRN: [id number] S:intubated/sedated O:BP 89/68  Pulse 61  Temp(Src) 97.7 F (36.5 C) (Core (Comment))  Resp 20  Ht 5\' 8"  (1.727 m)  Wt 114.3 kg (251 lb 15.8 oz)  BMI 38.32 kg/m2  SpO2 100%  Intake/Output Summary (Last 24 hours) at 11/01/13 0919 Last data filed at 11/01/13 0800  Gross per 24 hour  Intake 9950.92 ml  Output   7445 ml  Net 2505.92 ml   Intake/Output: I/O last 3 completed shifts: In: 15568.8 [I.V.:10918.8; NG/GT:2250; IV Piggyback:2400] Out: [id number] [Urine:19; Other:12776]  Intake/Output this shift:  Total I/O In: -  Out: 435 [Urine:15; Other:420] Weight change: 3.3 kg (7 lb 4.4 oz) Gen:WD obese WF intubated and critically ill CVS:RRR Resp:cta XBJ:YNWGNFAOZHAbd:hypoactive BS, soft YQM:VHQIONGEXExt:worsening cyanosis of toes bilaterally   Recent Labs Lab 09/20/2013 1140 10/29/13 0500  10/30/13 0355  10/30/13 0803 10/30/13 1225 10/30/13 1701 10/31/13 0500 10/31/13 0958 10/31/13 1006 10/31/13 1700 10/31/13 2310 11/01/13 0500  NA 135* 137  --  133*  --  135* 131*  --  132*  --   --  135*  135*  --  138  K 3.7 4.0  --  4.0  --  3.9 3.7  --  3.2*  --   --  3.9  4.1  --  2.8*  CL 97 101  --  100  --  98 96  --  91*  --   --  94*  93*  --  101  CO2 20 14*  --  10*  --  14* 14*  --  19  --   --  27  26  --  24  GLUCOSE 109* 116*  --  182*  --  176* 211*  --  197*  --   --  177*  177*  --  145*  BUN 34* 41*  --  58*  --  60* 60*  --  42*  --   --  32*  33*  --  23  CREATININE 1.48* 1.53*  --  2.52*  --  2.81* 2.88*  --  2.26*  --   --  1.92*  1.95*  --  1.59*  ALBUMIN 2.9* 2.4*  --  2.0*  --   --  1.7*  --  1.8*  --  1.7* 1.7*  --  1.4*  CALCIUM 7.6* 6.7*  --  6.0*  --  6.2* 5.9*  --  5.6*  --   --  5.9*  5.9*  --  4.9*  PHOS  --  2.3  < >  --   < >  --  2.4 2.5 1.3* 1.0*  --  1.2*  1.2* 1.1* 0.9*  AST 238* 264*  --  255*  --   --   --   --   --   --  233*  --   --  201*  ALT 116* 101*  --  83*  --   --   --   --    --   --  71*  --   --  65*  < > = values in this interval not displayed. Liver Function Tests:  Recent Labs Lab 10/30/13 0355  10/31/13 1006 10/31/13 1700 11/01/13 0500  AST 255*  --  233*  --  201*  ALT 83*  --  71*  --  65*  ALKPHOS 123*  --  166*  --  179*  BILITOT 1.5*  --  2.5*  --  2.1*  PROT 5.1*  --  4.8*  --  4.3*  ALBUMIN 2.0*  < > 1.7* 1.7* 1.4*  < > = values in this interval not displayed.  Recent Labs Lab 10/19/2013 1140 10/29/13 1110  LIPASE 47  --   AMYLASE  --  83   No results found for this basename: AMMONIA,  in the last 168 hours CBC:  Recent Labs Lab 10/29/13 0500 10/29/13 1110 10/30/13 0355 10/31/13 0400 11/01/13 0500  WBC 7.9 7.8 13.9* 11.5* 10.4  NEUTROABS  --  6.9 11.7* 7.8* 6.3  HGB 12.8 12.6 13.2 11.5* 10.8*  HCT 36.8 37.4 40.4 32.2* 31.6*  MCV 85.8 87.8 88.8 84.7 84.5  PLT 43* 42* 70* 42* 45*   Cardiac Enzymes:  Recent Labs Lab 10/17/2013 1140 10/31/13 1006  TROPONINI <0.30 <0.30   CBG:  Recent Labs Lab 10/31/13 1628 10/31/13 2003 10/31/13 2359 11/01/13 0420 11/01/13 0757  GLUCAP 136* 123* 170* 171* 120*    Iron Studies: No results found for this basename: IRON, TIBC, TRANSFERRIN, FERRITIN,  in the last 72 hours Studies/Results: Ct Abdomen Pelvis Wo Contrast  10/30/2013   CLINICAL DATA:  Cough and shortness of breath. High fevers. Sepsis.  EXAM: CT CHEST, ABDOMEN AND PELVIS WITHOUT CONTRAST  TECHNIQUE: Multidetector CT imaging of the chest, abdomen and pelvis was performed following the standard protocol without IV contrast.  COMPARISON:  Current chest radiograph  FINDINGS: CT CHEST FINDINGS  Images somewhat degraded by motion. Heart is normal in size. Great vessels are normal in caliber. No neck base, axillary, mediastinal or hilar masses or pathologically enlarged lymph nodes. Endotracheal tube tip lies 2.6 cm above the carina. Right internal jugular central venous line tip lies in the mid mid superior vena cava. Left internal  jugular central venous line tip lies at the caval atrial junction. Nasogastric tube passes below the diaphragm into the distal stomach.  Lungs show lower lobe dependent opacity, left greater than right, most consistent with atelectasis. Pneumonia, particularly in the left lower lobe, is possible, however. No pleural effusion is seen. There is no pulmonary edema. No pneumothorax.  CT ABDOMEN AND PELVIS FINDINGS  Liver is mildly enlarged and shows diffuse fatty infiltration. No liver mass or focal lesion. Spleen is borderline enlarged measuring 13 cm in greatest dimension. Spleen is otherwise unremarkable. Gallbladder surgically absent. Normal pancreas. No bile duct dilation. No adrenal masses. No renal masses, stones or hydronephrosis. Normal ureters. Bladder is decompressed by a Foley catheter.  The uterus and adnexa are unremarkable.  No pathologically enlarged lymph nodes.  Small amount pelvic free fluid, nonspecific. No evidence of an abscess.  Colon and small bowel are unremarkable.  A normal appendix is seen.  There is a catheter in the right external iliac artery extending to the scan in the right inguinal region.  MUSCULOSKELETAL: No osteoblastic or osteolytic lesions. Degenerative changes are noted of the visualized spine.  IMPRESSION: 1. There is dependent opacity in both lower lobes, left greater than right, most likely due to atelectasis. Pneumonia is not excluded but felt less likely. Lungs are otherwise clear. No pulmonary edema. 2. No acute findings below the diaphragm. 3. Mild hepatomegaly with diffuse hepatic steatosis. Borderline splenomegaly. 4. Status post cholecystectomy. 5. Small amount pelvic free fluid, nonspecific. 6. No bowel inflammatory changes.  Normal appendix visualized. 7. Support apparatus is well positioned.   Electronically Signed   By: Amie Portland M.D.   On:  10/30/2013 15:43   Ct Chest Wo Contrast  10/30/2013   CLINICAL DATA:  Cough and shortness of breath. High fevers. Sepsis.   EXAM: CT CHEST, ABDOMEN AND PELVIS WITHOUT CONTRAST  TECHNIQUE: Multidetector CT imaging of the chest, abdomen and pelvis was performed following the standard protocol without IV contrast.  COMPARISON:  Current chest radiograph  FINDINGS: CT CHEST FINDINGS  Images somewhat degraded by motion. Heart is normal in size. Great vessels are normal in caliber. No neck base, axillary, mediastinal or hilar masses or pathologically enlarged lymph nodes. Endotracheal tube tip lies 2.6 cm above the carina. Right internal jugular central venous line tip lies in the mid mid superior vena cava. Left internal jugular central venous line tip lies at the caval atrial junction. Nasogastric tube passes below the diaphragm into the distal stomach.  Lungs show lower lobe dependent opacity, left greater than right, most consistent with atelectasis. Pneumonia, particularly in the left lower lobe, is possible, however. No pleural effusion is seen. There is no pulmonary edema. No pneumothorax.  CT ABDOMEN AND PELVIS FINDINGS  Liver is mildly enlarged and shows diffuse fatty infiltration. No liver mass or focal lesion. Spleen is borderline enlarged measuring 13 cm in greatest dimension. Spleen is otherwise unremarkable. Gallbladder surgically absent. Normal pancreas. No bile duct dilation. No adrenal masses. No renal masses, stones or hydronephrosis. Normal ureters. Bladder is decompressed by a Foley catheter.  The uterus and adnexa are unremarkable.  No pathologically enlarged lymph nodes.  Small amount pelvic free fluid, nonspecific. No evidence of an abscess.  Colon and small bowel are unremarkable.  A normal appendix is seen.  There is a catheter in the right external iliac artery extending to the scan in the right inguinal region.  MUSCULOSKELETAL: No osteoblastic or osteolytic lesions. Degenerative changes are noted of the visualized spine.  IMPRESSION: 1. There is dependent opacity in both lower lobes, left greater than right, most  likely due to atelectasis. Pneumonia is not excluded but felt less likely. Lungs are otherwise clear. No pulmonary edema. 2. No acute findings below the diaphragm. 3. Mild hepatomegaly with diffuse hepatic steatosis. Borderline splenomegaly. 4. Status post cholecystectomy. 5. Small amount pelvic free fluid, nonspecific. 6. No bowel inflammatory changes.  Normal appendix visualized. 7. Support apparatus is well positioned.   Electronically Signed   By: Amie Portland M.D.   On: 10/30/2013 15:43   Dg Chest Port 1 View  11/01/2013   CLINICAL DATA:  Assess endotracheal tube  EXAM: PORTABLE CHEST - 1 VIEW  COMPARISON:  10/31/2013  FINDINGS: The carina is difficult to visualize due to overlapping material. The endotracheal tube appears in similar position however, with the tip at the level of the clavicular heads. Bilateral internal jugular catheters are in stable and good position. The orogastric tube remains at least at the level of the stomach.  Stable heart size and upper mediastinal contours, distorted by leftward rotation. A retrocardiac opacity appears similar to yesterday. By CT from 2 days prior, this was likely atelectasis. No evidence of edema, effusion, or pneumothorax.  IMPRESSION: 1. Stable positioning of tubes and lines. 2. No change in retrocardiac atelectasis.   Electronically Signed   By: Tiburcio Pea M.D.   On: 11/01/2013 06:53   Dg Chest Port 1 View  10/31/2013   CLINICAL DATA:  Check endotracheal tube  EXAM: PORTABLE CHEST - 1 VIEW  COMPARISON:  10/30/2013  FINDINGS: Endotracheal tube ends at the mid thoracic trachea. There are bilateral internal jugular vein central  lines, which remain in good position. An orogastric tube enters the stomach at least.  No change in heart size or mediastinal contours when accounting for leftward rotation. There is worsening retrocardiac or aeration with the diaphragm less well seen. This likely represents atelectasis, as noted on preceding chest CT. No evidence  of edema, effusion, or pneumothorax.  IMPRESSION: 1. Tubes and lines remain in good position. 2. Mild increase in retrocardiac atelectasis.   Electronically Signed   By: Tiburcio Pea M.D.   On: 10/31/2013 06:49   Dg Chest Port 1 View  10/30/2013   CLINICAL DATA:  Post Diatek catheter placement  EXAM: PORTABLE CHEST - 1 VIEW  COMPARISON:  Portable exam 1154 hr compared to 10/30/2013 at 0455 hr  FINDINGS: Tip of endotracheal tube projects 3.9 cm above carina.  RIGHT jugular central venous catheter tip projects over SVC.  New LEFT jugular Diatek catheter with tip projecting over cavoatrial junction.  Normal heart size and mediastinal contours.  Slight pulmonary vascular congestion.  Peribronchial thickening without infiltrate, pleural effusion or pneumothorax.  External artifacts project over chest.  IMPRESSION: Bronchitic changes and slight pulmonary vascular congestion.  No pneumothorax following LEFT jugular line placement.   Electronically Signed   By: Ulyses Southward M.D.   On: 10/30/2013 12:16   . amiodarone  150 mg Intravenous Once  . antiseptic oral rinse  15 mL Mouth Rinse QID  . chlorhexidine  15 mL Mouth Rinse BID  . doxycycline (VIBRAMYCIN) IV  100 mg Intravenous Q12H  . feeding supplement (VITAL HIGH PROTEIN)  1,000 mL Per Tube Q24H  . imipenem-cilastatin  250 mg Intravenous Q6H  . insulin aspart  0-15 Units Subcutaneous 6 times per day  . levETIRAcetam  500 mg Per Tube BID  . research study medication  0.25-2 mL Intravenous Q12H  . pantoprazole (PROTONIX) IV  40 mg Intravenous Q24H  . potassium phosphate IVPB (mEq)  40 mEq Intravenous Once  . vancomycin  1,000 mg Intravenous Q24H    BMET    Component Value Date/Time   NA 138 11/01/2013 0500   K 2.8* 11/01/2013 0500   CL 101 11/01/2013 0500   CO2 24 11/01/2013 0500   GLUCOSE 145* 11/01/2013 0500   BUN 23 11/01/2013 0500   CREATININE 1.59* 11/01/2013 0500   CALCIUM 4.9* 11/01/2013 0500   GFRNONAA 33* 11/01/2013 0500   GFRAA 38* 11/01/2013 0500    CBC    Component Value Date/Time   WBC 10.4 11/01/2013 0500   WBC 4.7 12/10/2012 0855   RBC 3.74* 11/01/2013 0500   RBC 4.59 12/10/2012 0855   HGB 10.8* 11/01/2013 0500   HCT 31.6* 11/01/2013 0500   PLT 45* 11/01/2013 0500   MCV 84.5 11/01/2013 0500   MCH 28.9 11/01/2013 0500   MCH 29.8 12/10/2012 0855   MCHC 34.2 11/01/2013 0500   MCHC 34.1 12/10/2012 0855   RDW 15.7* 11/01/2013 0500   RDW 13.8 12/10/2012 0855   LYMPHSABS 2.3 11/01/2013 0500   LYMPHSABS 1.9 12/10/2012 0855   MONOABS 1.4* 11/01/2013 0500   EOSABS 0.1 11/01/2013 0500   EOSABS 0.1 12/10/2012 0855   BASOSABS 0.3* 11/01/2013 0500   BASOSABS 0.0 12/10/2012 0855     Assessment/Plan:  1. AKI in setting of SIRS. oliguric with increasing pressor requirements.  1. initiation of CVVHD without UF on 10/30/13. 2. Keep even and no heparin 3. Improvement of electrolytes 4. Have stopped isotonic bicarb and switched to prismasate to help with K levels 2. Metabolic acidosis-  secondary to #1  1. Improved with CVVHD and isotonic bicarb 2. Cont to follow electrolytes  3. Hypokalemia- on on prismasate but will need some Kphos as well 1. Also check Mg levels 4. Hypophosphatemia- IV K-Phos 5. SIRS- on doxy, imipenem, and vanco and now zosyn an high dose pressors (levophed and epi, wean per PCCM) 6. VDRF- per PCCM 7. 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 Thrombocytopenia 9. Abnormal LFT's- ?shock liver vs. Ehrlichiosis/rickettsial disease 10. Hyperglycemia/DM- per PCCM 11.  Bryndle Corredor A

## 2013-11-01 NOTE — Progress Notes (Signed)
PULMONARY / CRITICAL CARE MEDICINE   Name: Tracie [name] MRN: [id number] DOB: [birth year]    ADMISSION DATE:  11/25/13 CONSULTATION DATE:  6/39/2015  REFERRING MD :  EDP PRIMARY SERVICE: PCCM  CHIEF COMPLAINT:  SOB  BRIEF PATIENT DESCRIPTION: 68 y.o. F transferred from [facility] with septic shock, unclear source. Concern tick borne illness, MODS.  SIGNIFICANT EVENTS / STUDIES:  6/28 - presented to ED with SOB, treated w rocephin for UTI and sent home with levaquin for bronchitis. 6/29 - back to ED in respiratory distress, hypotensive, lactic acidosis, fever to 104.  Transferred to [facility] ICU. 6/30 - renal US>>>. Negative for hydronephrosis.  Hepatic steatosis 6/30 - echo - Limited study quality but there appears to be normal biventricular size and systolic function. Impaired relaxation with normal filling pressures 6/30 - doppler legs bilat>>>neg 7/01 - worsening MODS, shock, acidosis 7/01 - CT abdo/pelvis>>>neg acute 7/01 - Ct chest>>>mild left base atx 7/02 - improved levophed 7/03 - remains on levo, vaso, arouses / follows commands   LINES / TUBES: Foley 6/29 >>> 6/29 ETT>>> 6/29 rt [place]>>> 6/29 fem aline>>>  CULTURES: Blood 6/29 >>> Urine 6/29 >>> Respiratory 6/29  >>>rhinovirus positive  RVP 6/29 >>>  Lyme titer>>>neg  RMSF>>>neg  HIV>>>neg  ANTIBIOTICS: Zosyn 6/29 >>>7/1 Imipenem 7/1>>> Vancomycin 6/29 >>> Doxy 6/29>>>  SUBJECTIVE:  Off Epi gtt, levo / vaso remain   VITAL SIGNS: Temp:  [97.1 F (36.2 C)-100.1 F (37.8 C)] 97.7 F (36.5 C) (07/03 0756) Pulse Rate:  [60-149] 61 (07/03 0844) Resp:  [10-20] 20 (07/03 0844) BP: (67-138)/(42-122) 89/68 mmHg (07/03 0844) SpO2:  [81 %-100 %] 100 % (07/03 0844) Arterial Line BP: (60-166)/(51-90) 105/60 mmHg (07/03 0600) FiO2 (%):  [40 %-50 %] 50 % (07/03 0844) Weight:  [251 lb 15.8 oz (114.3 kg)] 251 lb 15.8 oz (114.3 kg) (07/03 0437) HEMODYNAMICS:   VENTILATOR SETTINGS: Vent Mode:  [-] PRVC FiO2 (%):  [40 %-50  %] 50 % Set Rate:  [20 bmp] 20 bmp Vt Set:  [550 mL] 550 mL PEEP:  [5 cmH20] 5 cmH20 Plateau Pressure:  [18 cmH20-25 cmH20] 21 cmH20 INTAKE / OUTPUT: Intake/Output     07/02 0701 - 07/03 0700 07/03 0701 - 07/04 0700   I.V. (mL/kg) 7410.1 (64.8)    Blood     NG/GT 1430    IV Piggyback 1950    Total Intake(mL/kg) 10790.1 (94.4)    Urine (mL/kg/hr) 4 (0) 15 (0)   Other 8102 (3) 806 (2.3)   Total Output 8106 821   Net +2684.1 -821          PHYSICAL EXAMINATION: General: Elderly appearing female, responsive Neuro: rass -1, moves all ext equal, follows commands, int drowsy HEENT: ett, mm pink/moist, +jvd Cardiovascular: RRR, no M/R/G.  Lungs: resp's even/non-labored, lungs bilaterally coarse Abdomen: BS low, soft, NT/ND Musculoskeletal: No gross deformities, increased edema. Peripheral pulses +  Dusky toes (vaso response)   LABS:  CBC  Recent Labs Lab 10/30/13 0355 10/31/13 0400 11/01/13 0500  WBC 13.9* 11.5* 10.4  HGB 13.2 11.5* 10.8*  HCT 40.4 32.2* 31.6*  PLT 70* 42* 45*   Coag's  Recent Labs Lab 10/30/13 0803 10/31/13 0400 11/01/13 0500  APTT 49* >200* 37  INR 1.92* 3.22* 1.45   BMET  Recent Labs Lab 10/31/13 0500 10/31/13 1700 11/01/13 0500  NA 132* 135*  135* 138  K 3.2* 3.9  4.1 2.8*  CL 91* 94*  93* 101  CO2 19 27  26  24  BUN 42* 32*  33* 23  CREATININE 2.26* 1.92*  1.95* 1.59*  GLUCOSE 197* 177*  177* 145*   Electrolytes  Recent Labs Lab 10/31/13 0500  10/31/13 1700 10/31/13 2310 11/01/13 0500  CALCIUM 5.6*  --  5.9*  5.9*  --  4.9*  MG  --   < > 2.0 2.0 1.8  PHOS 1.3*  < > 1.2*  1.2* 1.1* 0.9*  < > = values in this interval not displayed. Sepsis Markers  Recent Labs Lab 10/01/2013 1955 10/29/13 0020 10/29/13 0500 10/29/13 0556 10/30/13 0355 10/30/13 0945  LATICACIDVEN 5.9* 3.9*  --  4.0*  --  5.7*  PROCALCITON 20.00  --  23.98  --  36.13  --    ABG  Recent Labs Lab 10/31/13 0418 10/31/13 1110 11/01/13 0258   PHART 7.380 7.395 7.312*  PCO2ART 33.9* 38.5 51.2*  PO2ART 66.0* 62.0* 59.7*   Liver Enzymes  Recent Labs Lab 10/30/13 0355  10/31/13 1006 10/31/13 1700 11/01/13 0500  AST 255*  --  233*  --  201*  ALT 83*  --  71*  --  65*  ALKPHOS 123*  --  166*  --  179*  BILITOT 1.5*  --  2.5*  --  2.1*  ALBUMIN 2.0*  < > 1.7* 1.7* 1.4*  < > = values in this interval not displayed. Cardiac Enzymes  Recent Labs Lab 10/05/2013 1140 10/31/13 1006  TROPONINI <0.30 <0.30  PROBNP 2537.0*  --    Glucose  Recent Labs Lab 10/31/13 1243 10/31/13 1628 10/31/13 2003 10/31/13 2359 11/01/13 0420 11/01/13 0757  GLUCAP 156* 136* 123* 170* 171* 120*    Imaging Ct Abdomen Pelvis Wo Contrast  10/30/2013   CLINICAL DATA:  Cough and shortness of breath. High fevers. Sepsis.  EXAM: CT CHEST, ABDOMEN AND PELVIS WITHOUT CONTRAST  TECHNIQUE: Multidetector CT imaging of the chest, abdomen and pelvis was performed following the standard protocol without IV contrast.  COMPARISON:  Current chest radiograph  FINDINGS: CT CHEST FINDINGS  Images somewhat degraded by motion. Heart is normal in size. Great vessels are normal in caliber. No neck base, axillary, mediastinal or hilar masses or pathologically enlarged lymph nodes. Endotracheal tube tip lies 2.6 cm above the carina. Right internal jugular central venous line tip lies in the mid mid superior vena cava. Left internal jugular central venous line tip lies at the caval atrial junction. Nasogastric tube passes below the diaphragm into the distal stomach.  Lungs show lower lobe dependent opacity, left greater than right, most consistent with atelectasis. Pneumonia, particularly in the left lower lobe, is possible, however. No pleural effusion is seen. There is no pulmonary edema. No pneumothorax.  CT ABDOMEN AND PELVIS FINDINGS  Liver is mildly enlarged and shows diffuse fatty infiltration. No liver mass or focal lesion. Spleen is borderline enlarged measuring 13 cm  in greatest dimension. Spleen is otherwise unremarkable. Gallbladder surgically absent. Normal pancreas. No bile duct dilation. No adrenal masses. No renal masses, stones or hydronephrosis. Normal ureters. Bladder is decompressed by a Foley catheter.  The uterus and adnexa are unremarkable.  No pathologically enlarged lymph nodes.  Small amount pelvic free fluid, nonspecific. No evidence of an abscess.  Colon and small bowel are unremarkable.  A normal appendix is seen.  There is a catheter in the right external iliac artery extending to the scan in the right inguinal region.  MUSCULOSKELETAL: No osteoblastic or osteolytic lesions. Degenerative changes are noted of the visualized spine.  IMPRESSION: 1. There  is dependent opacity in both lower lobes, left greater than right, most likely due to atelectasis. Pneumonia is not excluded but felt less likely. Lungs are otherwise clear. No pulmonary edema. 2. No acute findings below the diaphragm. 3. Mild hepatomegaly with diffuse hepatic steatosis. Borderline splenomegaly. 4. Status post cholecystectomy. 5. Small amount pelvic free fluid, nonspecific. 6. No bowel inflammatory changes.  Normal appendix visualized. 7. Support apparatus is well positioned.   Electronically Signed   By: Amie Portland M.D.   On: 10/30/2013 15:43   Ct Chest Wo Contrast  10/30/2013   CLINICAL DATA:  Cough and shortness of breath. High fevers. Sepsis.  EXAM: CT CHEST, ABDOMEN AND PELVIS WITHOUT CONTRAST  TECHNIQUE: Multidetector CT imaging of the chest, abdomen and pelvis was performed following the standard protocol without IV contrast.  COMPARISON:  Current chest radiograph  FINDINGS: CT CHEST FINDINGS  Images somewhat degraded by motion. Heart is normal in size. Great vessels are normal in caliber. No neck base, axillary, mediastinal or hilar masses or pathologically enlarged lymph nodes. Endotracheal tube tip lies 2.6 cm above the carina. Right internal jugular central venous line tip lies  in the mid mid superior vena cava. Left internal jugular central venous line tip lies at the caval atrial junction. Nasogastric tube passes below the diaphragm into the distal stomach.  Lungs show lower lobe dependent opacity, left greater than right, most consistent with atelectasis. Pneumonia, particularly in the left lower lobe, is possible, however. No pleural effusion is seen. There is no pulmonary edema. No pneumothorax.  CT ABDOMEN AND PELVIS FINDINGS  Liver is mildly enlarged and shows diffuse fatty infiltration. No liver mass or focal lesion. Spleen is borderline enlarged measuring 13 cm in greatest dimension. Spleen is otherwise unremarkable. Gallbladder surgically absent. Normal pancreas. No bile duct dilation. No adrenal masses. No renal masses, stones or hydronephrosis. Normal ureters. Bladder is decompressed by a Foley catheter.  The uterus and adnexa are unremarkable.  No pathologically enlarged lymph nodes.  Small amount pelvic free fluid, nonspecific. No evidence of an abscess.  Colon and small bowel are unremarkable.  A normal appendix is seen.  There is a catheter in the right external iliac artery extending to the scan in the right inguinal region.  MUSCULOSKELETAL: No osteoblastic or osteolytic lesions. Degenerative changes are noted of the visualized spine.  IMPRESSION: 1. There is dependent opacity in both lower lobes, left greater than right, most likely due to atelectasis. Pneumonia is not excluded but felt less likely. Lungs are otherwise clear. No pulmonary edema. 2. No acute findings below the diaphragm. 3. Mild hepatomegaly with diffuse hepatic steatosis. Borderline splenomegaly. 4. Status post cholecystectomy. 5. Small amount pelvic free fluid, nonspecific. 6. No bowel inflammatory changes.  Normal appendix visualized. 7. Support apparatus is well positioned.   Electronically Signed   By: Amie Portland M.D.   On: 10/30/2013 15:43   Dg Chest Port 1 View  11/01/2013   CLINICAL DATA:   Assess endotracheal tube  EXAM: PORTABLE CHEST - 1 VIEW  COMPARISON:  10/31/2013  FINDINGS: The carina is difficult to visualize due to overlapping material. The endotracheal tube appears in similar position however, with the tip at the level of the clavicular heads. Bilateral internal jugular catheters are in stable and good position. The orogastric tube remains at least at the level of the stomach.  Stable heart size and upper mediastinal contours, distorted by leftward rotation. A retrocardiac opacity appears similar to yesterday. By CT from 2 days prior,  this was likely atelectasis. No evidence of edema, effusion, or pneumothorax.  IMPRESSION: 1. Stable positioning of tubes and lines. 2. No change in retrocardiac atelectasis.   Electronically Signed   By: Tiburcio PeaJonathan  Watts M.D.   On: 11/01/2013 06:53   Dg Chest Port 1 View  10/31/2013   CLINICAL DATA:  Check endotracheal tube  EXAM: PORTABLE CHEST - 1 VIEW  COMPARISON:  10/30/2013  FINDINGS: Endotracheal tube ends at the mid thoracic trachea. There are bilateral internal jugular vein central lines, which remain in good position. An orogastric tube enters the stomach at least.  No change in heart size or mediastinal contours when accounting for leftward rotation. There is worsening retrocardiac or aeration with the diaphragm less well seen. This likely represents atelectasis, as noted on preceding chest CT. No evidence of edema, effusion, or pneumothorax.  IMPRESSION: 1. Tubes and lines remain in good position. 2. Mild increase in retrocardiac atelectasis.   Electronically Signed   By: Tiburcio PeaJonathan  Watts M.D.   On: 10/31/2013 06:49   Dg Chest Port 1 View  10/30/2013   CLINICAL DATA:  Post Diatek catheter placement  EXAM: PORTABLE CHEST - 1 VIEW  COMPARISON:  Portable exam 1154 hr compared to 10/30/2013 at 0455 hr  FINDINGS: Tip of endotracheal tube projects 3.9 cm above carina.  RIGHT jugular central venous catheter tip projects over SVC.  New LEFT jugular Diatek  catheter with tip projecting over cavoatrial junction.  Normal heart size and mediastinal contours.  Slight pulmonary vascular congestion.  Peribronchial thickening without infiltrate, pleural effusion or pneumothorax.  External artifacts project over chest.  IMPRESSION: Bronchitic changes and slight pulmonary vascular congestion.  No pneumothorax following LEFT jugular line placement.   Electronically Signed   By: Ulyses SouthwardMark  Boles M.D.   On: 10/30/2013 12:16     ASSESSMENT / PLAN:  PULMONARY A: Acute Respiratory Failure Compensated respiratory alkalosis OSA R/o developing PNA R/O PE - Doppler legs neg, normal RV, low clinical suspicion for PE P:   -NO SBT with high MV needs -Trend PCXR  -adjust vent rate to 24, f/u abg   CARDIOVASCULAR A:  Septic Shock - refractory, unclear etiology.  Off epi gtt.  P:  -maintain vaso -septic shock protocl  -cvp trending 10, allow pos balance further, was pos also 4 liters and tolerated well, no pulm edema, improved pressors  RENAL A:   AG metabolic acidosis - lactate.  Smaller NON AG  AKI - SCr improved from 6/28. Hypocalcemia P:   -CVVHD per Nephrology -continue bicarb gtt -frequent chem -replace mag, ca+ 7/3  GASTROINTESTINAL A:   Nutrition Transaminitis - sepsis,. Concern for tick borne illness (neg lyme titer).  CT reasuring no ischemia P:   - tolerating TF -ppi -trend LFT's  HEMATOLOGIC A:  DIC Thrombocytopenia - DIC.  No spontaneous bleeding noted.  Post vitamin K x 1 P:  -VTE Proph:  SCD's only. -lamictal held for lowplat -Trend CBC -PRN fibrinogen -consider transfuse cryo if bleeding  -Follow coags -may need heme consult  INFECTIOUS A:   Septic Shock - DIC association? Tick bite 10 days ago, r/o tick born illness UTI - resolved (treated 6/28). Rhinovirus P:   -maintain imi, doxy, vanc -ID following -doubt Rhino virus is pathogen  ENDOCRINE A:   Hyperglycemia  P:   -SSI  NEUROLOGIC A:   Hx of Seizures  -lamictal held for low plat Acute encephalopathy Delirium (MIND [place] Enrolment) P:   - Hold outpatient Lamictal (see DERM section below). -  Keppra - fent with WUA - Cam score   DERMATOLOGY A: Oral lesions - r/o early SJS R/o tic born illness  [place] P: - No Bactrim, hold Lamictal. - doxy  GLOBAL: -Daughter, Son & Sister updated 7/3 -Off epi gtt, remains on levo / vaso  -Full Code  Attending:  I have seen and examined the patient with nurse practitioner/resident and agree with the note above.   Epinephrine drip off Remains critically ill Vent adjusted  I wonder if this could be ehrlichiosis or human granulocytic anaplasmosis, labs sent  Maintain antibiotics  I have personally obtained a history, examined the patient, evaluated laboratory and imaging results, formulated the assessment and plan and placed orders.  CRITICAL CARE: The patient is critically ill with multiple organ systems failure and requires high complexity decision making for assessment and support, frequent evaluation and titration of therapies, application of advanced monitoring technologies and extensive interpretation of multiple databases. Critical Care Time devoted to patient care services described in this note is 45 minutes.   Heber Bosque, MD [place] P[facility]M Pager: [phone] Cell: [phone] If no response, call [phone]

## 2013-11-01 NOTE — Significant Event (Signed)
CRITICAL VALUE ALERT  Critical value received:Calcium 6.1  Date of notification: 11/01/13  Time of notification: 1645  Critical value read back: yes  Nurse who received alert:  Burnard BuntingKatrice Favor Kreh, RN  MD notified (1st page):  Dr Arrie Aranoladonato  Time of first page: [phone]  MD notified (2nd page):[phone]  Time MD responded:  [phone]

## 2013-11-01 NOTE — Progress Notes (Signed)
UR Completed.  Gagandeep Pettet Jane [phone] 11/01/2013  

## 2013-11-01 NOTE — Progress Notes (Signed)
CRITICAL VALUE ALERT  Critical value received:  K 2.8  Ca 4.9  Phos 0.9  Date of notification:  11/01/13  Time of notification:  0635  Critical value read back:yes  Nurse who received alert:  A.Canary BrimSperry RN  MD notified (1st page):  Sung AmabileSimonds MD  Time of first page:  [phone]  MD notified (2nd page):  Time of second page:  Responding MD:  Sung AmabileSimonds MD  Time MD responded:  [phone]

## 2013-11-01 NOTE — Progress Notes (Signed)
ANTIBIOTIC CONSULT NOTE - FOLLOW UP  Pharmacy Consult for Vancomycin and Imipenem Indication: sepsis  Allergies  Allergen Reactions  . Sulfa Antibiotics Rash    Oral blisters    Patient Measurements: Height: 5\' 8"  (172.7 cm) Weight: 251 lb 15.8 oz (114.3 kg) IBW/kg (Calculated) : 63.9  Vital Signs: Temp: 97.7 F (36.5 C) (07/03 0756) Temp src: Core (Comment) (07/03 0756) BP: 89/68 mmHg (07/03 0844) Pulse Rate: 61 (07/03 0844) Intake/Output from previous day: 07/02 0701 - 07/03 0700 In: 10790.1 [I.V.:7410.1; NG/GT:1430; IV Piggyback:1950] Out: 8106 [Urine:4] Intake/Output from this shift: Total I/O In: -  Out: 435 [Urine:15; Other:420]  Labs:  Recent Labs  10/30/13 0355  10/31/13 0400 10/31/13 0500 10/31/13 1700 11/01/13 0500  WBC 13.9*  --  11.5*  --   --  10.4  HGB 13.2  --  11.5*  --   --  10.8*  PLT 70*  --  42*  --   --  45*  CREATININE 2.52*  < >  --  2.26* 1.92*  1.95* 1.59*  < > = values in this interval not displayed. Estimated Creatinine Clearance: 46.2 ml/min (by C-G formula based on Cr of 1.59).  Recent Labs  10/31/13 0500  VANCORANDOM 14.7     Microbiology: Recent Results (from the past 720 hour(s))  URINE CULTURE     Status: None   Collection Time    10/27/13 12:36 PM      Result Value Ref Range Status   Specimen Description URINE, CLEAN CATCH   Final   Special Requests NONE   Final   Culture  Setup Time     Final   Value: 10/27/2013 00:06     Performed at 10/30/2013 Count     Final   Value: NO GROWTH     Performed at [facility]   Culture     Final   Value: NO GROWTH     Performed at [facility]   Report Status 10/29/2013 FINAL   Final  CULTURE, BLOOD (ROUTINE X 2)     Status: None   Collection Time    10/13/2013 12:00 PM      Result Value Ref Range Status   Specimen Description BLOOD RIGHT ANTECUBITAL   Final   Special Requests     Final   Value: BOTTLES DRAWN AEROBIC AND ANAEROBIC AEB=12CC  ANA=10CC   Culture  Setup Time     Final   Value: 10/30/2013 14:13     Performed at 12/31/2013   Culture     Final   Value:        BLOOD CULTURE RECEIVED NO GROWTH TO DATE CULTURE WILL BE HELD FOR 5 DAYS BEFORE ISSUING A FINAL NEGATIVE REPORT     Performed at [facility]   Report Status PENDING   Incomplete  CULTURE, BLOOD (ROUTINE X 2)     Status: None   Collection Time    10/24/2013 12:25 PM      Result Value Ref Range Status   Specimen Description BLOOD LEFT HAND   Final   Special Requests BOTTLES DRAWN AEROBIC ONLY 6CC   Final   Culture  Setup Time     Final   Value: 10/30/2013 14:14     Performed at 12/31/2013   Culture     Final   Value:        BLOOD CULTURE RECEIVED NO GROWTH TO DATE CULTURE WILL BE  HELD FOR 5 DAYS BEFORE ISSUING A FINAL NEGATIVE REPORT     Performed at [facility]   Report Status PENDING   Incomplete  URINE CULTURE     Status: None   Collection Time    10/29/2013 12:36 PM      Result Value Ref Range Status   Specimen Description URINE, CATHETERIZED   Final   Special Requests NONE   Final   Culture  Setup Time     Final   Value: 10/19/2013 23:20     Performed at [place]     Final   Value: NO GROWTH     Performed at [facility]   Culture     Final   Value: NO GROWTH     Performed at [facility]   Report Status 10/29/2013 FINAL   Final  URINE CULTURE     Status: None   Collection Time    10/25/2013  6:19 PM      Result Value Ref Range Status   Specimen Description URINE, CATHETERIZED   Final   Special Requests Normal   Final   Culture  Setup Time     Final   Value: 10/04/2013 19:10     Performed at [place]     Final   Value: NO GROWTH     Performed at [facility]   Culture     Final   Value: NO GROWTH     Performed at [facility]   Report Status 10/29/2013 FINAL   Final  MRSA PCR SCREENING     Status: None   Collection  Time    10/11/2013  6:19 PM      Result Value Ref Range Status   MRSA by PCR NEGATIVE  NEGATIVE Final   Comment:            The GeneXpert MRSA Assay (FDA     approved for NASAL specimens     only), is one component of a     comprehensive MRSA colonization     surveillance program. It is not     intended to diagnose MRSA     infection nor to guide or     monitor treatment for     MRSA infections.  CULTURE, BLOOD (ROUTINE X 2)     Status: None   Collection Time    10/25/2013  7:55 PM      Result Value Ref Range Status   Specimen Description BLOOD LEFT HAND   Final   Special Requests BOTTLES DRAWN AEROBIC ONLY 1CC   Final   Culture  Setup Time     Final   Value: 09/30/2013 23:16     Performed at [facility]   Culture     Final   Value:        BLOOD CULTURE RECEIVED NO GROWTH TO DATE CULTURE WILL BE HELD FOR 5 DAYS BEFORE ISSUING A FINAL NEGATIVE REPORT     Performed at [facility]   Report Status PENDING   Incomplete  RESPIRATORY VIRUS PANEL     Status: Abnormal   Collection Time    10/27/2013  8:01 PM      Result Value Ref Range Status   Source - RVPAN NASAL SWAB   Corrected   Comment: CORRECTED ON 07/01 AT 1356: PREVIOUSLY REPORTED AS NASAL SWAB   Respiratory Syncytial Virus A NOT DETECTED  Final   Respiratory Syncytial Virus B NOT DETECTED   Final   Influenza A NOT DETECTED   Final   Influenza B NOT DETECTED   Final   Parainfluenza 1 NOT DETECTED   Final   Parainfluenza 2 NOT DETECTED   Final   Parainfluenza 3 NOT DETECTED   Final   Metapneumovirus NOT DETECTED   Final   Rhinovirus DETECTED (*)  Final   Adenovirus NOT DETECTED   Final   Influenza A H1 NOT DETECTED   Final   Influenza A H3 NOT DETECTED   Final   Comment: (NOTE)           Normal Reference Range for each Analyte: NOT DETECTED     Testing performed using the Luminex xTAG Respiratory Viral Panel test     kit.     This test was developed and its performance characteristics determined     by  [facility]. It has not been cleared or approved by the [place]     Food and Drug Administration. This test is used for clinical purposes.     It should not be regarded as investigational or for research. This     laboratory is certified under the [place] (CLIA) as qualified to perform high complexity     clinical laboratory testing.     Performed at [facility], BLOOD (ROUTINE X 2)     Status: None   Collection Time    10/24/2013  8:13 PM      Result Value Ref Range Status   Specimen Description BLOOD LEFT FOREARM   Final   Special Requests BOTTLES DRAWN AEROBIC ONLY 3CC   Final   Culture  Setup Time     Final   Value: 10/16/2013 23:16     Performed at [facility]   Culture     Final   Value:        BLOOD CULTURE RECEIVED NO GROWTH TO DATE CULTURE WILL BE HELD FOR 5 DAYS BEFORE ISSUING A FINAL NEGATIVE REPORT     Performed at [facility]   Report Status PENDING   Incomplete    Anti-infectives   Start     Dose/Rate Route Frequency Ordered Stop   11/01/13 1200  vancomycin (VANCOCIN) IVPB 1000 mg/200 mL premix     1,000 mg 200 mL/hr over 60 Minutes Intravenous Every 24 hours 11/01/13 0845     11/01/13 1128  vancomycin (VANCOCIN) 1,500 mg in sodium chloride 0.9 % 500 mL IVPB  Status:  Discontinued     1,500 mg 250 mL/hr over 120 Minutes Intravenous Every 24 hours 10/31/13 1138 11/01/13 0845   10/31/13 1000  vancomycin (VANCOCIN) 1,500 mg in sodium chloride 0.9 % 500 mL IVPB  Status:  Discontinued     1,500 mg 250 mL/hr over 120 Minutes Intravenous Every 48 hours 10/30/13 0802 10/31/13 1138   10/30/13 1400  imipenem-cilastatin (PRIMAXIN) 250 mg in sodium chloride 0.9 % 100 mL IVPB     250 mg 200 mL/hr over 30 Minutes Intravenous Every 6 hours 10/30/13 1108     10/29/13 1100  levofloxacin (LEVAQUIN) IVPB 750 mg  Status:  Discontinued     750 mg 100 mL/hr over 90 Minutes Intravenous Every 48 hours  10/29/13 1000 10/29/13 1002   10/29/13 1100  doxycycline (VIBRAMYCIN) 100 mg in dextrose 5 % 250 mL IVPB     100 mg 125  mL/hr over 120 Minutes Intravenous Every 12 hours 10/29/13 1002     10/29/13 1000  vancomycin (VANCOCIN) IVPB 750 mg/150 ml premix  Status:  Discontinued     750 mg 150 mL/hr over 60 Minutes Intravenous Every 12 hours 10/23/2013 1851 10/30/13 0802   10/29/2013 2200  piperacillin-tazobactam (ZOSYN) IVPB 3.375 g  Status:  Discontinued     3.375 g 12.5 mL/hr over 240 Minutes Intravenous 3 times per day 10/10/2013 1851 10/30/13 1031   10/14/2013 1900  vancomycin (VANCOCIN) IVPB 1000 mg/200 mL premix     1,000 mg 200 mL/hr over 60 Minutes Intravenous NOW 10/01/2013 1851 10/27/2013 2120   10/13/2013 1400  vancomycin (VANCOCIN) IVPB 1000 mg/200 mL premix     1,000 mg 200 mL/hr over 60 Minutes Intravenous  Once 10/10/2013 1346 09/30/2013 1626   09/30/2013 1345  piperacillin-tazobactam (ZOSYN) IVPB 3.375 g     3.375 g 100 mL/hr over 30 Minutes Intravenous  Once 10/06/2013 1346 10/06/2013 1501      Assessment: 68 year old female on Day #5 of antibiotic therapy for sepsis, currently Vancomycin/Imipenem/Doxycycline.  She remains on CRRT and her Vancomycin regimen requires adjustment for CRRT.  Her Imipenem and doxycycline regimens are appropriate for CRRT.  Goal of Therapy:  Vancomycin trough level 15-20 mcg/ml  Plan:  Change Vancomycin to 1gm IV q24h Continue Primaxin 250mg  IV q6h Continue Doxycycline 100mg  IV q12h Monitor CRRT plans and tolerance Follow available micro data  Legrand Como, Pharm.D., BCPS, AAHIVP Clinical Pharmacist Phone: [phone] or [phone] 11/01/2013, 8:48 AM

## 2013-11-02 ENCOUNTER — Inpatient Hospital Stay (HOSPITAL_COMMUNITY): Payer: Medicare HMO

## 2013-11-02 LAB — RENAL FUNCTION PANEL
ALBUMIN: 1.7 g/dL — AB (ref 3.5–5.2)
ANION GAP: 13 (ref 5–15)
Albumin: 1.7 g/dL — ABNORMAL LOW (ref 3.5–5.2)
Anion gap: 13 (ref 5–15)
BUN: 18 mg/dL (ref 6–23)
BUN: 17 mg/dL (ref 6–23)
CHLORIDE: 95 meq/L — AB (ref 96–112)
CHLORIDE: 96 meq/L (ref 96–112)
CO2: 26 mEq/L (ref 19–32)
CO2: 22 meq/L (ref 19–32)
CREATININE: 1.49 mg/dL — AB (ref 0.50–1.10)
CREATININE: 1.5 mg/dL — AB (ref 0.50–1.10)
Calcium: 7.4 mg/dL — ABNORMAL LOW (ref 8.4–10.5)
Calcium: 8.1 mg/dL — ABNORMAL LOW (ref 8.4–10.5)
GFR calc Af Amer: 41 mL/min — ABNORMAL LOW (ref >90)
GFR calc Af Amer: 41 mL/min — ABNORMAL LOW (ref >90)
GFR calc non Af Amer: 35 mL/min — ABNORMAL LOW (ref >90)
GFR calc non Af Amer: 35 mL/min — ABNORMAL LOW (ref >90)
GLUCOSE: 203 mg/dL — AB (ref 70–99)
Glucose, Bld: 168 mg/dL — ABNORMAL HIGH (ref 70–99)
PHOSPHORUS: 1.5 mg/dL — AB (ref 2.3–4.6)
POTASSIUM: 3.6 meq/L — AB (ref 3.7–5.3)
POTASSIUM: 4.3 meq/L (ref 3.7–5.3)
Phosphorus: 3 mg/dL (ref 2.3–4.6)
Sodium: 134 mEq/L — ABNORMAL LOW (ref 137–147)
Sodium: 131 mEq/L — ABNORMAL LOW (ref 137–147)

## 2013-11-02 LAB — EHRLICHIA ANTIBODY PANEL: E chaffeensis (HGE) Ab, IgM: <1:20 {titer}

## 2013-11-02 LAB — GLUCOSE, CAPILLARY
GLUCOSE-CAPILLARY: 118 mg/dL — AB (ref 70–99)
GLUCOSE-CAPILLARY: 126 mg/dL — AB (ref 70–99)
GLUCOSE-CAPILLARY: 127 mg/dL — AB (ref 70–99)
GLUCOSE-CAPILLARY: 177 mg/dL — AB (ref 70–99)
Glucose-Capillary: 140 mg/dL — ABNORMAL HIGH (ref 70–99)
Glucose-Capillary: 149 mg/dL — ABNORMAL HIGH (ref 70–99)

## 2013-11-02 LAB — CBC
HEMATOCRIT: 29.2 % — AB (ref 36.0–46.0)
Hemoglobin: 10 g/dL — ABNORMAL LOW (ref 12.0–15.0)
MCH: 29.5 pg (ref 26.0–34.0)
MCHC: 34.2 g/dL (ref 30.0–36.0)
MCV: 86.1 fL (ref 78.0–100.0)
PLATELETS: 56 10*3/uL — AB (ref 150–400)
RBC: 3.39 MIL/uL — ABNORMAL LOW (ref 3.87–5.11)
RDW: 15.6 % — ABNORMAL HIGH (ref 11.5–15.5)
WBC: 9.7 10*3/uL (ref 4.0–10.5)

## 2013-11-02 MED ORDER — DEXTROSE 5 % IV SOLN
40.0000 meq | Freq: Once | INTRAVENOUS | Status: AC
  Administered 2013-11-02: 40 meq via INTRAVENOUS
  Filled 2013-11-02: qty 9.09

## 2013-11-02 MED ORDER — HYDROCORTISONE NA SUCCINATE PF 100 MG IJ SOLR
50.0000 mg | Freq: Four times a day (QID) | INTRAMUSCULAR | Status: DC
  Administered 2013-11-02 – 2013-11-05 (×13): 50 mg via INTRAVENOUS
  Filled 2013-11-02 (×16): qty 1

## 2013-11-02 NOTE — Progress Notes (Signed)
PULMONARY / CRITICAL CARE MEDICINE   Name: Tracie [name] MRN: [id number] DOB: February 12, 1946    ADMISSION DATE:  11/26/13 CONSULTATION DATE:  6/39/2015  REFERRING MD :  EDP PRIMARY SERVICE: PCCM  CHIEF COMPLAINT:  SOB  BRIEF PATIENT DESCRIPTION: 68 y.o. F transferred from [facility] with septic shock, unclear source. Concern for tick borne illness, MODS.  SIGNIFICANT EVENTS / STUDIES:  6/28 - presented to ED with SOB, treated w rocephin for UTI and sent home with levaquin for bronchitis. 6/29 - back to ED in respiratory distress, hypotensive, lactic acidosis, fever to 104.  Transferred to [facility] ICU. 6/30 - renal US>>>. Negative for hydronephrosis.  Hepatic steatosis 6/30 - echo - Limited study quality but there appears to be normal biventricular size and systolic function. Impaired relaxation with normal filling pressures 6/30 - doppler legs bilat>>>neg 7/01 - worsening MODS, shock, acidosis 7/01 - CT abdo/pelvis>>>neg acute 7/01 - Ct chest>>>mild left base atx 7/02 - improved levophed 7/03 - remains on levo, vaso, arouses / follows commands   LINES / TUBES: Foley 6/29 >>> 6/29 ETT>>> 6/29 rt [place]>>> 6/29 fem aline>>>  CULTURES: Blood 6/29 >>> Urine 6/29 >>> Respiratory 6/29  >>>rhinovirus positive  RVP 6/29 >>>  Lyme titer>>>neg  RMSF>>>neg  HIV>>>neg  Ehrlichia AB >>  Babesiosis AB >> 7/3 Ehrlichia PCR >>> 7/3 Anaplasma PCR >>>  ANTIBIOTICS: Zosyn 6/29 >>>7/1 Imipenem 7/1>>> Vancomycin 6/29 >>> Doxy 6/29>>>  SUBJECTIVE:  Off Epi gtt, levo / vaso remain   VITAL SIGNS: Temp:  [96.5 F (35.8 C)-99.5 F (37.5 C)] 99.5 F (37.5 C) (07/04 0715) Pulse Rate:  [55-119] 69 (07/04 0803) Resp:  [9-25] 24 (07/04 0803) BP: (56-107)/(35-86) 99/59 mmHg (07/04 0700) SpO2:  [97 %-100 %] 100 % (07/04 0803) Arterial Line BP: (73-127)/(41-68) 97/44 mmHg (07/04 0715) FiO2 (%):  [40 %-50 %] 40 % (07/04 0803) Weight:  [257 lb 15 oz (117 kg)] 257 lb 15 oz (117 kg) (07/04  0428) HEMODYNAMICS:   VENTILATOR SETTINGS: Vent Mode:  [-] PRVC FiO2 (%):  [40 %-50 %] 40 % Set Rate:  [24 bmp] 24 bmp Vt Set:  [550 mL] 550 mL PEEP:  [5 cmH20] 5 cmH20 Plateau Pressure:  [19 cmH20-22 cmH20] 22 cmH20 INTAKE / OUTPUT: Intake/Output     07/03 0701 - 07/04 0700 07/04 0701 - 07/05 0700   I.V. (mL/kg) 5760.6 (49.2)    NG/GT 1690    IV Piggyback 1610    Total Intake(mL/kg) 9060.6 (77.4)    Urine (mL/kg/hr) 27 (0)    Other 7348 (2.6) 223 (0.7)   Stool 50 (0)    Total Output 7425 223   Net +1635.6 -223          PHYSICAL EXAMINATION: General: Elderly appearing female, responsive Neuro: rass -1, moves all ext equal, follows commands, int drowsy HEENT: ett, mm pink/moist, +jvd Cardiovascular: RRR, no M/R/G.  Lungs: resp's even/non-labored, lungs bilaterally coarse Abdomen: BS low, soft, NT/ND Musculoskeletal: No gross deformities, increased edema. Peripheral pulses +  Dusky toes (vaso response)   LABS:  CBC  Recent Labs Lab 10/31/13 0400 11/01/13 0500 11/02/13 0400  WBC 11.5* 10.4 9.7  HGB 11.5* 10.8* 10.0*  HCT 32.2* 31.6* 29.2*  PLT 42* 45* 56*   Coag's  Recent Labs Lab 10/30/13 0803 10/31/13 0400 11/01/13 0500  APTT 49* >200* 37  INR 1.92* 3.22* 1.45   BMET  Recent Labs Lab 11/01/13 1558 11/01/13 2009 11/02/13 0400  NA 133* 134* 134*  K 3.6* 3.5* 3.6*  CL 94* 96 95*  CO2 28 27 26   BUN 20 20 18   CREATININE 1.54* 1.51* 1.49*  GLUCOSE 179* 140* 168*   Electrolytes  Recent Labs Lab 10/31/13 1700 10/31/13 2310 11/01/13 0500 11/01/13 1558 11/01/13 2009 11/02/13 0400  CALCIUM 5.9*  5.9*  --  4.9* 6.1* 6.5* 7.4*  MG 2.0 2.0 1.8  --   --   --   PHOS 1.2*  1.2* 1.1* 0.9* 2.0* 1.3* 1.5*   Sepsis Markers  Recent Labs Lab 25-May-2013 1955 10/29/13 0020 10/29/13 0500 10/29/13 0556 10/30/13 0355 10/30/13 0945  LATICACIDVEN 5.9* 3.9*  --  4.0*  --  5.7*  PROCALCITON 20.00  --  23.98  --  36.13  --    ABG  Recent Labs Lab  10/31/13 1110 11/01/13 0258 11/01/13 1140  PHART 7.395 7.312* 7.434  PCO2ART 38.5 51.2* 39.4  PO2ART 62.0* 59.7* 91.3   Liver Enzymes  Recent Labs Lab 10/30/13 0355  10/31/13 1006  11/01/13 0500 11/01/13 1558 11/01/13 2009 11/02/13 0400  AST 255*  --  233*  --  201*  --   --   --   ALT 83*  --  71*  --  65*  --   --   --   ALKPHOS 123*  --  166*  --  179*  --   --   --   BILITOT 1.5*  --  2.5*  --  2.1*  --   --   --   ALBUMIN 2.0*  < > 1.7*  < > 1.4* 1.7* 1.7* 1.7*  < > = values in this interval not displayed. Cardiac Enzymes  Recent Labs Lab 25-May-2013 1140 10/31/13 1006  TROPONINI <0.30 <0.30  PROBNP 2537.0*  --    Glucose  Recent Labs Lab 11/01/13 1237 11/01/13 1549 11/01/13 1954 11/02/13 0014 11/02/13 0404 11/02/13 0725  GLUCAP 182* 173* 109* 118* 127* 140*    Imaging Dg Chest Port 1 View  11/02/2013   CLINICAL DATA:  Intubated.  EXAM: PORTABLE CHEST - 1 VIEW  COMPARISON:  Yesterday.  FINDINGS: The endotracheal tube is in satisfactory position. Stable bilateral jugular catheters. Nasogastric tube extending into the stomach. Dense left lower lobe opacity, increased. Interval right basilar linear and patchy opacity. Mildly increased linear density in the right perihilar region and left lower lung zone. Grossly normal sized heart. Thoracic spine degenerative changes.  IMPRESSION: 1. Increased dense left lower lobe atelectasis or pneumonia. 2. Interval right basilar atelectasis. 3. Mildly increased atelectasis elsewhere in both lungs.   Electronically Signed   By: Gordan PaymentSteve  Reid M.D.   On: 11/02/2013 08:50   Dg Chest Port 1 View  11/01/2013   CLINICAL DATA:  Assess endotracheal tube  EXAM: PORTABLE CHEST - 1 VIEW  COMPARISON:  10/31/2013  FINDINGS: The carina is difficult to visualize due to overlapping material. The endotracheal tube appears in similar position however, with the tip at the level of the clavicular heads. Bilateral internal jugular catheters are in stable  and good position. The orogastric tube remains at least at the level of the stomach.  Stable heart size and upper mediastinal contours, distorted by leftward rotation. A retrocardiac opacity appears similar to yesterday. By CT from 2 days prior, this was likely atelectasis. No evidence of edema, effusion, or pneumothorax.  IMPRESSION: 1. Stable positioning of tubes and lines. 2. No change in retrocardiac atelectasis.   Electronically Signed   By: Tiburcio PeaJonathan  Watts M.D.   On: 11/01/2013 06:53  ASSESSMENT / PLAN:  PULMONARY A: Acute Respiratory Failure Compensated respiratory alkalosis OSA R/o developing PNA - LLL airspace disease  R/O PE - Doppler legs neg, normal RV, low clinical suspicion for PE P:   -NO SBT with high MV needs -Trend PCXR  -adjust vent rate to 22 -PRN follow up ABG   CARDIOVASCULAR A:  Septic Shock - refractory, unclear etiology.  Off epi gtt since 7/2.  P:  -maintain vaso, levo -septic shock protocol  -allow pos balance for now -add stress steroids 7/4  RENAL A:   AG metabolic acidosis - lactate.  Smaller NON AG.  Bicarb d/c'd 7/3 AKI - remains on CVVHD Hypocalcemia P:   -CVVHD per Nephrology -frequent chem -replace K/phos 7/4  GASTROINTESTINAL A:   Nutrition Transaminitis - sepsis,. Concern for tick borne illness (neg lyme titer).  CT reasuring no ischemia P:   -tolerating TF -ppi -trend LFT's  HEMATOLOGIC A:  DIC Thrombocytopenia - DIC.  No spontaneous bleeding noted.  Post vitamin K x 1 P:  -VTE Proph:  SCD's only. -lamictal held for low platelets -Trend CBC -consider transfuse cryo if bleeding  -Follow coags -may need heme consult  INFECTIOUS A:   Septic Shock - DIC association? Tick bite 10 days ago, r/o tick born illness.  Consider ehrlichiosis or human granulocytic anaplasmosis UTI - resolved (treated 6/28). Rhinovirus P:   -maintain imi, doxy, vanc -ID following -doubt Rhino virus is pathogen  ENDOCRINE A:    Hyperglycemia  P:   -SSI  NEUROLOGIC A:   Hx of Seizures - lamictal held for low plat Acute encephalopathy Delirium (MIND [place] Enrolment) P:   - Hold outpatient Lamictal (see DERM section below). - Keppra - fent with WUA - Cam score   DERMATOLOGY A: R/o tic born illness  [place] P: - No Bactrim, hold Lamictal. - doxy, abx as above  GLOBAL: -Daughter, Son & Brother updated 7/3 -Remains on levo / vaso  -Full Code   Canary BrimBrandi Ollis, NP-C [facility] Pgr: [phone] or [phone]   Attending:  I have seen and examined the patient with nurse practitioner/resident and agree with the note above.   Levophed requirement improved slightly, CXR, exam unchanged today.  Updated family at length, no major changes to plan   I have personally obtained a history, examined the patient, evaluated laboratory and imaging results, formulated the assessment and plan and placed orders.  CRITICAL CARE: The patient is critically ill with multiple organ systems failure and requires high complexity decision making for assessment and support, frequent evaluation and titration of therapies, application of advanced monitoring technologies and extensive interpretation of multiple databases. Critical Care Time devoted to patient care services described in this note is 40 minutes.   Heber CarolinaBrent McQuaid, MD [place] [facility] Pager: [phone] Cell: [phone] If no response, call [phone]

## 2013-11-02 NOTE — Progress Notes (Signed)
[place] for Infectious Disease  Date of Admission:  10/22/2013  Antibiotics: Vancomycin day 6 Imipenem day 4 Doxycycline day 6 Zosyn 3 days  Subjective: Remains intubated, on dialysis now  Objective: Temp:  [96.5 F (35.8 C)-99.5 F (37.5 C)] 98.4 F (36.9 C) (07/04 1000) Pulse Rate:  [55-119] 63 (07/04 1145) Resp:  [9-25] 22 (07/04 1145) BP: (56-107)/(35-86) 87/47 mmHg (07/04 1000) SpO2:  [97 %-100 %] 99 % (07/04 1145) Arterial Line BP: (73-127)/(41-68) 92/43 mmHg (07/04 1000) FiO2 (%):  [40 %-50 %] 40 % (07/04 1145) Weight:  [257 lb 15 oz (117 kg)] 257 lb 15 oz (117 kg) (07/04 0428)  General: some responsiveness Lungs: CTA B, anterior exam Cor: Tachy RR Abdomen: soft, nt, nd Ext: no edema  Lab Results Lab Results  Component Value Date   WBC 9.7 11/02/2013   HGB 10.0* 11/02/2013   HCT 29.2* 11/02/2013   MCV 86.1 11/02/2013   PLT 56* 11/02/2013    Lab Results  Component Value Date   CREATININE 1.49* 11/02/2013   BUN 18 11/02/2013   NA 134* 11/02/2013   K 3.6* 11/02/2013   CL 95* 11/02/2013   CO2 26 11/02/2013    Lab Results  Component Value Date   ALT 65* 11/01/2013   AST 201* 11/01/2013   ALKPHOS 179* 11/01/2013   BILITOT 2.1* 11/01/2013      Microbiology: Recent Results (from the past 240 hour(s))  URINE CULTURE     Status: None   Collection Time    10/27/13 12:36 PM      Result Value Ref Range Status   Specimen Description URINE, CLEAN CATCH   Final   Special Requests NONE   Final   Culture  Setup Time     Final   Value: 10/04/2013 00:06     Performed at [place]     Final   Value: NO GROWTH     Performed at [facility]   Culture     Final   Value: NO GROWTH     Performed at [facility]   Report Status 10/29/2013 FINAL   Final  CULTURE, BLOOD (ROUTINE X 2)     Status: None   Collection Time    10/22/2013 12:00 PM      Result Value Ref Range Status   Specimen Description BLOOD RIGHT ANTECUBITAL   Final   Special Requests     Final   Value: BOTTLES DRAWN AEROBIC AND ANAEROBIC AEB=12CC ANA=10CC   Culture  Setup Time     Final   Value: 10/30/2013 14:13     Performed at [facility]   Culture     Final   Value:        BLOOD CULTURE RECEIVED NO GROWTH TO DATE CULTURE WILL BE HELD FOR 5 DAYS BEFORE ISSUING A FINAL NEGATIVE REPORT     Performed at [facility]   Report Status PENDING   Incomplete  CULTURE, BLOOD (ROUTINE X 2)     Status: None   Collection Time    10/10/2013 12:25 PM      Result Value Ref Range Status   Specimen Description BLOOD LEFT HAND   Final   Special Requests BOTTLES DRAWN AEROBIC ONLY [facility]   Final   Culture  Setup Time     Final   Value: 10/30/2013 14:14     Performed at [facility]  Final   Value:        BLOOD CULTURE RECEIVED NO GROWTH TO DATE CULTURE WILL BE HELD FOR 5 DAYS BEFORE ISSUING A FINAL NEGATIVE REPORT     Performed at [facility]   Report Status PENDING   Incomplete  URINE CULTURE     Status: None   Collection Time    10/11/2013 12:36 PM      Result Value Ref Range Status   Specimen Description URINE, CATHETERIZED   Final   Special Requests NONE   Final   Culture  Setup Time     Final   Value: 10/23/2013 23:20     Performed at [place]     Final   Value: NO GROWTH     Performed at [facility]   Culture     Final   Value: NO GROWTH     Performed at [facility]   Report Status 10/29/2013 FINAL   Final  URINE CULTURE     Status: None   Collection Time    10/14/2013  6:19 PM      Result Value Ref Range Status   Specimen Description URINE, CATHETERIZED   Final   Special Requests Normal   Final   Culture  Setup Time     Final   Value: 10/15/2013 19:10     Performed at [place]     Final   Value: NO GROWTH     Performed at [facility]   Culture     Final   Value: NO GROWTH     Performed at [facility]   Report  Status 10/29/2013 FINAL   Final  MRSA PCR SCREENING     Status: None   Collection Time    10/07/2013  6:19 PM      Result Value Ref Range Status   MRSA by PCR NEGATIVE  NEGATIVE Final   Comment:            The GeneXpert MRSA Assay (FDA     approved for NASAL specimens     only), is one component of a     comprehensive MRSA colonization     surveillance program. It is not     intended to diagnose MRSA     infection nor to guide or     monitor treatment for     MRSA infections.  CULTURE, BLOOD (ROUTINE X 2)     Status: None   Collection Time    10/02/2013  7:55 PM      Result Value Ref Range Status   Specimen Description BLOOD LEFT HAND   Final   Special Requests BOTTLES DRAWN AEROBIC ONLY 1CC   Final   Culture  Setup Time     Final   Value: 10/23/2013 23:16     Performed at [facility]   Culture     Final   Value:        BLOOD CULTURE RECEIVED NO GROWTH TO DATE CULTURE WILL BE HELD FOR 5 DAYS BEFORE ISSUING A FINAL NEGATIVE REPORT     Performed at [facility]   Report Status PENDING   Incomplete  RESPIRATORY VIRUS PANEL     Status: Abnormal   Collection Time    10/16/2013  8:01 PM      Result Value Ref Range Status   Source - RVPAN NASAL SWAB   Corrected  Comment: CORRECTED ON 07/01 AT 1356: PREVIOUSLY REPORTED AS NASAL SWAB   Respiratory Syncytial Virus A NOT DETECTED   Final   Respiratory Syncytial Virus B NOT DETECTED   Final   Influenza A NOT DETECTED   Final   Influenza B NOT DETECTED   Final   Parainfluenza 1 NOT DETECTED   Final   Parainfluenza 2 NOT DETECTED   Final   Parainfluenza 3 NOT DETECTED   Final   Metapneumovirus NOT DETECTED   Final   Rhinovirus DETECTED (*)  Final   Adenovirus NOT DETECTED   Final   Influenza A H1 NOT DETECTED   Final   Influenza A H3 NOT DETECTED   Final   Comment: (NOTE)           Normal Reference Range for each Analyte: NOT DETECTED     Testing performed using the Luminex xTAG Respiratory Viral Panel test     kit.      This test was developed and its performance characteristics determined     by [facility]. It has not been cleared or approved by the [place]     Food and Drug Administration. This test is used for clinical purposes.     It should not be regarded as investigational or for research. This     laboratory is certified under the [place] (CLIA) as qualified to perform high complexity     clinical laboratory testing.     Performed at [place], BLOOD (ROUTINE X 2)     Status: None   Collection Time    10/05/2013  8:13 PM      Result Value Ref Range Status   Specimen Description BLOOD LEFT FOREARM   Final   Special Requests BOTTLES DRAWN AEROBIC ONLY 3CC   Final   Culture  Setup Time     Final   Value: 10/05/2013 23:16     Performed at [facility]   Culture     Final   Value:        BLOOD CULTURE RECEIVED NO GROWTH TO DATE CULTURE WILL BE HELD FOR 5 DAYS BEFORE ISSUING A FINAL NEGATIVE REPORT     Performed at [facility]   Report Status PENDING   Incomplete    Studies/Results: Dg Chest Port 1 View  11/02/2013   CLINICAL DATA:  Intubated.  EXAM: PORTABLE CHEST - 1 VIEW  COMPARISON:  Yesterday.  FINDINGS: The endotracheal tube is in satisfactory position. Stable bilateral jugular catheters. Nasogastric tube extending into the stomach. Dense left lower lobe opacity, increased. Interval right basilar linear and patchy opacity. Mildly increased linear density in the right perihilar region and left lower lung zone. Grossly normal sized heart. Thoracic spine degenerative changes.  IMPRESSION: 1. Increased dense left lower lobe atelectasis or pneumonia. 2. Interval right basilar atelectasis. 3. Mildly increased atelectasis elsewhere in both lungs.   Electronically Signed   By: Enrique Sack M.D.   On: 11/02/2013 08:50   Dg Chest Port 1 View  11/01/2013   CLINICAL DATA:  Assess endotracheal tube  EXAM: PORTABLE CHEST - 1  VIEW  COMPARISON:  10/31/2013  FINDINGS: The carina is difficult to visualize due to overlapping material. The endotracheal tube appears in similar position however, with the tip at the level of the clavicular heads. Bilateral internal jugular catheters are in stable and good position. The orogastric tube remains at least at the  level of the stomach.  Stable heart size and upper mediastinal contours, distorted by leftward rotation. A retrocardiac opacity appears similar to yesterday. By CT from 2 days prior, this was likely atelectasis. No evidence of edema, effusion, or pneumothorax.  IMPRESSION: 1. Stable positioning of tubes and lines. 2. No change in retrocardiac atelectasis.   Electronically Signed   By: Jorje Guild M.D.   On: 11/01/2013 06:53    Assessment/Plan: 1) SIRS - no etiology identified.  RMSF is negative IgM, will need convalescent serology in about 2 weeks to confirm.  Doubt other tick borne etiologies.  Rhinovirus not typically going to cause SIRS.  Bacterial etiology most likely.  Continue broad spectrum antibiotics.   2) Thrombocytopenia - no previous Plts to compare, has been present on admission.  May be due to #1, tick illness, sepsis vs chronic.  Has fatty liver, mild splenomegaly.  May have some underlying chronic abnormality.  No known alcohol history.    3) transaminitis - ? Fatty liver, shock liver, ? Chronic.  Improved some.      Scharlene Gloss, [place] for Infectious Disease [place] [email or web address] [phone] pager   [phone] cell 11/02/2013, 12:08 PM

## 2013-11-02 NOTE — Progress Notes (Signed)
Patient ID: Particia JasperBrenda B Hegarty, female   DOB: 07/14/1945, 68 y.o.   MRN: [id number] S:intubated O:BP 87/47  Pulse 57  Temp(Src) 98.4 F (36.9 C) (Oral)  Resp 24  Ht 5\' 8"  (1.727 m)  Wt 117 kg (257 lb 15 oz)  BMI 39.23 kg/m2  SpO2 100%  Intake/Output Summary (Last 24 hours) at 11/02/13 1040 Last data filed at 11/02/13 1000  Gross per 24 hour  Intake 8486.48 ml  Output   6971 ml  Net 1515.48 ml   Intake/Output: I/O last 3 completed shifts: In: 14083.3 [I.V.:9618.3; NG/GT:2405; IV Piggyback:2060] Out: [id number] [Urine:27; XBJYN:82956Other:11695; Stool:50]  Intake/Output this shift:  Total I/O In: 688.2 [I.V.:393.2; NG/GT:195; IV Piggyback:100] Out: 749 [Other:749] Weight change: 2.7 kg (5 lb 15.2 oz) Gen:WD obese WF intubated and critically ill OZH:YQMVHQIONGECVS:bradycardic, no rub Resp:cta XBM:WUXLKAbd:obese, hypoactive BS Ext:+cyanosis   Recent Labs Lab 2014/01/16 1140 10/29/13 0500  10/30/13 0355  10/30/13 1225  10/31/13 0500 10/31/13 0958 10/31/13 1006 10/31/13 1700 10/31/13 2310 11/01/13 0500 11/01/13 1558 11/01/13 2009 11/02/13 0400  NA 135* 137  --  133*  < > 131*  --  132*  --   --  135*  135*  --  138 133* 134* 134*  K 3.7 4.0  --  4.0  < > 3.7  --  3.2*  --   --  3.9  4.1  --  2.8* 3.6* 3.5* 3.6*  CL 97 101  --  100  < > 96  --  91*  --   --  94*  93*  --  101 94* 96 95*  CO2 20 14*  --  10*  < > 14*  --  19  --   --  27  26  --  24 28 27 26   GLUCOSE 109* 116*  --  182*  < > 211*  --  197*  --   --  177*  177*  --  145* 179* 140* 168*  BUN 34* 41*  --  58*  < > 60*  --  42*  --   --  32*  33*  --  23 20 20 18   CREATININE 1.48* 1.53*  --  2.52*  < > 2.88*  --  2.26*  --   --  1.92*  1.95*  --  1.59* 1.54* 1.51* 1.49*  ALBUMIN 2.9* 2.4*  --  2.0*  --  1.7*  --  1.8*  --  1.7* 1.7*  --  1.4* 1.7* 1.7* 1.7*  CALCIUM 7.6* 6.7*  --  6.0*  < > 5.9*  --  5.6*  --   --  5.9*  5.9*  --  4.9* 6.1* 6.5* 7.4*  PHOS  --  2.3  < >  --   < > 2.4  < > 1.3* 1.0*  --  1.2*  1.2* 1.1* 0.9* 2.0* 1.3* 1.5*   AST 238* 264*  --  255*  --   --   --   --   --  233*  --   --  201*  --   --   --   ALT 116* 101*  --  83*  --   --   --   --   --  71*  --   --  65*  --   --   --   < > = values in this interval not displayed. Liver Function Tests:  Recent Labs Lab 10/30/13 0355  10/31/13 1006  11/01/13  0500 11/01/13 1558 11/01/13 2009 11/02/13 0400  AST 255*  --  233*  --  201*  --   --   --   ALT 83*  --  71*  --  65*  --   --   --   ALKPHOS 123*  --  166*  --  179*  --   --   --   BILITOT 1.5*  --  2.5*  --  2.1*  --   --   --   PROT 5.1*  --  4.8*  --  4.3*  --   --   --   ALBUMIN 2.0*  < > 1.7*  < > 1.4* 1.7* 1.7* 1.7*  < > = values in this interval not displayed.  Recent Labs Lab 11-02-2013 1140 10/29/13 1110  LIPASE 47  --   AMYLASE  --  83   No results found for this basename: AMMONIA,  in the last 168 hours CBC:  Recent Labs Lab 10/29/13 1110 10/30/13 0355 10/31/13 0400 11/01/13 0500 11/02/13 0400  WBC 7.8 13.9* 11.5* 10.4 9.7  NEUTROABS 6.9 11.7* 7.8* 6.3  --   HGB 12.6 13.2 11.5* 10.8* 10.0*  HCT 37.4 40.4 32.2* 31.6* 29.2*  MCV 87.8 88.8 84.7 84.5 86.1  PLT 42* 70* 42* 45* 56*   Cardiac Enzymes:  Recent Labs Lab Nov 02, 2013 1140 10/31/13 1006  TROPONINI <0.30 <0.30   CBG:  Recent Labs Lab 11/01/13 1549 11/01/13 1954 11/02/13 0014 11/02/13 0404 11/02/13 0725  GLUCAP 173* 109* 118* 127* 140*    Iron Studies: No results found for this basename: IRON, TIBC, TRANSFERRIN, FERRITIN,  in the last 72 hours Studies/Results: Dg Chest Port 1 View  11/02/2013   CLINICAL DATA:  Intubated.  EXAM: PORTABLE CHEST - 1 VIEW  COMPARISON:  Yesterday.  FINDINGS: The endotracheal tube is in satisfactory position. Stable bilateral jugular catheters. Nasogastric tube extending into the stomach. Dense left lower lobe opacity, increased. Interval right basilar linear and patchy opacity. Mildly increased linear density in the right perihilar region and left lower lung zone. Grossly  normal sized heart. Thoracic spine degenerative changes.  IMPRESSION: 1. Increased dense left lower lobe atelectasis or pneumonia. 2. Interval right basilar atelectasis. 3. Mildly increased atelectasis elsewhere in both lungs.   Electronically Signed   By: Gordan Payment M.D.   On: 11/02/2013 08:50   Dg Chest Port 1 View  11/01/2013   CLINICAL DATA:  Assess endotracheal tube  EXAM: PORTABLE CHEST - 1 VIEW  COMPARISON:  10/31/2013  FINDINGS: The carina is difficult to visualize due to overlapping material. The endotracheal tube appears in similar position however, with the tip at the level of the clavicular heads. Bilateral internal jugular catheters are in stable and good position. The orogastric tube remains at least at the level of the stomach.  Stable heart size and upper mediastinal contours, distorted by leftward rotation. A retrocardiac opacity appears similar to yesterday. By CT from 2 days prior, this was likely atelectasis. No evidence of edema, effusion, or pneumothorax.  IMPRESSION: 1. Stable positioning of tubes and lines. 2. No change in retrocardiac atelectasis.   Electronically Signed   By: Tiburcio Pea M.D.   On: 11/01/2013 06:53   . amiodarone  150 mg Intravenous Once  . antiseptic oral rinse  15 mL Mouth Rinse QID  . chlorhexidine  15 mL Mouth Rinse BID  . doxycycline (VIBRAMYCIN) IV  100 mg Intravenous Q12H  . feeding supplement (VITAL HIGH PROTEIN)  1,000 mL Per Tube Q24H  . fentaNYL  50 mcg Intravenous Once  . imipenem-cilastatin  250 mg Intravenous Q6H  . insulin aspart  0-15 Units Subcutaneous 6 times per day  . levETIRAcetam  500 mg Per Tube BID  . research study medication  0.25-2 mL Intravenous Q12H  . pantoprazole (PROTONIX) IV  40 mg Intravenous Q24H  . vancomycin  1,000 mg Intravenous Q24H    BMET    Component Value Date/Time   NA 134* 11/02/2013 0400   K 3.6* 11/02/2013 0400   CL 95* 11/02/2013 0400   CO2 26 11/02/2013 0400   GLUCOSE 168* 11/02/2013 0400   BUN 18  11/02/2013 0400   CREATININE 1.49* 11/02/2013 0400   CALCIUM 7.4* 11/02/2013 0400   GFRNONAA 35* 11/02/2013 0400   GFRAA 41* 11/02/2013 0400   CBC    Component Value Date/Time   WBC 9.7 11/02/2013 0400   WBC 4.7 12/10/2012 0855   RBC 3.39* 11/02/2013 0400   RBC 4.59 12/10/2012 0855   HGB 10.0* 11/02/2013 0400   HCT 29.2* 11/02/2013 0400   PLT 56* 11/02/2013 0400   MCV 86.1 11/02/2013 0400   MCH 29.5 11/02/2013 0400   MCH 29.8 12/10/2012 0855   MCHC 34.2 11/02/2013 0400   MCHC 34.1 12/10/2012 0855   RDW 15.6* 11/02/2013 0400   RDW 13.8 12/10/2012 0855   LYMPHSABS 2.3 11/01/2013 0500   LYMPHSABS 1.9 12/10/2012 0855   MONOABS 1.4* 11/01/2013 0500   EOSABS 0.1 11/01/2013 0500   EOSABS 0.1 12/10/2012 0855   BASOSABS 0.3* 11/01/2013 0500   BASOSABS 0.0 12/10/2012 0855     Assessment/Plan:  1. AKI in setting of SIRS. oliguric with increasing pressor requirements.  1. initiation of CVVHD without UF on 10/30/13. 2. Keep even and no heparin 3. Improvement of electrolytes 4. Have stopped isotonic bicarb and switched to prismasate to help with K levels 2. Metabolic acidosis- secondary to #1  1. Improved with CVVHD and isotonic bicarb 2. Cont to follow electrolytes  3. Hypokalemia- on 4mEq on prismasate but will need some Kphos as well  1. Also check Mg levels 4. Hypocalcemia- started on IV Calcium gluconate gtt and will stop in 6 hours (should improve now that she is off bicarb) 5. Hypophosphatemia- IV K-Phos 6. SIRS- on doxy, imipenem, and vanco and now zosyn an high dose pressors (levophed and vaso, wean per PCCM) 1. Agree with stress dose steroids per PCCM 7. VDRF- per PCCM 8. 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 Thrombocytopenia 10. Abnormal LFT's- ?shock liver vs. Ehrlichiosis/rickettsial disease 11. Hyperglycemia/DM- per PCCM 12.   Delayla Hoffmaster A

## 2013-11-03 ENCOUNTER — Inpatient Hospital Stay (HOSPITAL_COMMUNITY): Payer: Medicare HMO

## 2013-11-03 LAB — RENAL FUNCTION PANEL
ALBUMIN: 1.8 g/dL — AB (ref 3.5–5.2)
Albumin: 1.9 g/dL — ABNORMAL LOW (ref 3.5–5.2)
Anion gap: 12 (ref 5–15)
Anion gap: 13 (ref 5–15)
BUN: 19 mg/dL (ref 6–23)
BUN: 22 mg/dL (ref 6–23)
CALCIUM: 7.2 mg/dL — AB (ref 8.4–10.5)
CHLORIDE: 97 meq/L (ref 96–112)
CO2: 24 mEq/L (ref 19–32)
CO2: 23 mEq/L (ref 19–32)
CREATININE: 1.48 mg/dL — AB (ref 0.50–1.10)
Calcium: 7.5 mg/dL — ABNORMAL LOW (ref 8.4–10.5)
Chloride: 97 mEq/L (ref 96–112)
Creatinine, Ser: 1.39 mg/dL — ABNORMAL HIGH (ref 0.50–1.10)
GFR calc Af Amer: 41 mL/min — ABNORMAL LOW (ref >90)
GFR calc non Af Amer: 39 mL/min — ABNORMAL LOW (ref >90)
GFR, EST AFRICAN AMERICAN: 45 mL/min — AB (ref >90)
GFR, EST NON AFRICAN AMERICAN: 36 mL/min — AB (ref >90)
Glucose, Bld: 182 mg/dL — ABNORMAL HIGH (ref 70–99)
Glucose, Bld: 187 mg/dL — ABNORMAL HIGH (ref 70–99)
PHOSPHORUS: 2.7 mg/dL (ref 2.3–4.6)
Phosphorus: 2.9 mg/dL (ref 2.3–4.6)
Potassium: 4.3 mEq/L (ref 3.7–5.3)
Potassium: 4.3 mEq/L (ref 3.7–5.3)
Sodium: 133 mEq/L — ABNORMAL LOW (ref 137–147)
Sodium: 133 mEq/L — ABNORMAL LOW (ref 137–147)

## 2013-11-03 LAB — CBC
HCT: 25.6 % — ABNORMAL LOW (ref 36.0–46.0)
Hemoglobin: 8.7 g/dL — ABNORMAL LOW (ref 12.0–15.0)
MCH: 29 pg (ref 26.0–34.0)
MCHC: 34 g/dL (ref 30.0–36.0)
MCV: 85.3 fL (ref 78.0–100.0)
PLATELETS: 66 10*3/uL — AB (ref 150–400)
RBC: 3 MIL/uL — ABNORMAL LOW (ref 3.87–5.11)
RDW: 15.5 % (ref 11.5–15.5)
WBC: 11.5 10*3/uL — ABNORMAL HIGH (ref 4.0–10.5)

## 2013-11-03 LAB — GLUCOSE, CAPILLARY
GLUCOSE-CAPILLARY: 165 mg/dL — AB (ref 70–99)
GLUCOSE-CAPILLARY: 211 mg/dL — AB (ref 70–99)
Glucose-Capillary: 160 mg/dL — ABNORMAL HIGH (ref 70–99)
Glucose-Capillary: 171 mg/dL — ABNORMAL HIGH (ref 70–99)
Glucose-Capillary: 172 mg/dL — ABNORMAL HIGH (ref 70–99)
Glucose-Capillary: 193 mg/dL — ABNORMAL HIGH (ref 70–99)

## 2013-11-03 LAB — CULTURE, BLOOD (ROUTINE X 2)
Culture: NO GROWTH
Culture: NO GROWTH

## 2013-11-03 LAB — MAGNESIUM: MAGNESIUM: 2.3 mg/dL (ref 1.5–2.5)

## 2013-11-03 MED ORDER — PRO-STAT SUGAR FREE PO LIQD
30.0000 mL | Freq: Three times a day (TID) | ORAL | Status: DC
  Administered 2013-11-03 – 2013-11-04 (×2): 30 mL via ORAL
  Filled 2013-11-03 (×5): qty 30

## 2013-11-03 MED ORDER — PANTOPRAZOLE SODIUM 40 MG PO PACK
40.0000 mg | PACK | Freq: Every day | ORAL | Status: DC
  Administered 2013-11-03: 40 mg
  Filled 2013-11-03 (×2): qty 20

## 2013-11-03 NOTE — Progress Notes (Signed)
Patient ID: Tracie [name], female   DOB: 03/05/1946, 68 y.o.   MRN: [id number] S:intubated and sedated O:BP 110/73  Pulse 84  Temp(Src) 96.8 F (36 C) (Core (Comment))  Resp 16  Ht 5\' 8"  (1.727 m)  Wt 114.7 kg (252 lb 13.9 oz)  BMI 38.46 kg/m2  SpO2 88%  Intake/Output Summary (Last 24 hours) at 11/03/13 0931 Last data filed at 11/03/13 0900  Gross per 24 hour  Intake 5652.61 ml  Output   5907 ml  Net -254.39 ml   Intake/Output: I/O last 3 completed shifts: In: 9953.4 [I.V.:5598.4; NG/GT:2605; IV Piggyback:1750] Out: 9281 [Urine:30; Other:9051; Stool:200]  Intake/Output this shift:  Total I/O In: 431.1 [I.V.:201.1; NG/GT:130; IV Piggyback:100] Out: 369 [Other:369] Weight change: -2.3 kg (-5 lb 1.1 oz) Gen:WD obese WF intubated/sedated CVS:no rub Resp:cta VOZ:DGUYQIHKVQAbd:hypoactive BS Ext:+cyanosis   Recent Labs Lab 10/06/2013 1140 10/29/13 0500  10/30/13 0355  10/31/13 1006 10/31/13 1700 10/31/13 2310 11/01/13 0500 11/01/13 1558 11/01/13 2009 11/02/13 0400 11/02/13 1600 11/03/13 0445  NA 135* 137  --  133*  < >  --  135*  135*  --  138 133* 134* 134* 131* 133*  K 3.7 4.0  --  4.0  < >  --  3.9  4.1  --  2.8* 3.6* 3.5* 3.6* 4.3 4.3  CL 97 101  --  100  < >  --  94*  93*  --  101 94* 96 95* 96 97  CO2 20 14*  --  10*  < >  --  27  26  --  24 28 27 26 22 24   GLUCOSE 109* 116*  --  182*  < >  --  177*  177*  --  145* 179* 140* 168* 203* 182*  BUN 34* 41*  --  58*  < >  --  32*  33*  --  23 20 20 18 17 19   CREATININE 1.48* 1.53*  --  2.52*  < >  --  1.92*  1.95*  --  1.59* 1.54* 1.51* 1.49* 1.50* 1.48*  ALBUMIN 2.9* 2.4*  --  2.0*  < > 1.7* 1.7*  --  1.4* 1.7* 1.7* 1.7* 1.7* 1.8*  CALCIUM 7.6* 6.7*  --  6.0*  < >  --  5.9*  5.9*  --  4.9* 6.1* 6.5* 7.4* 8.1* 7.5*  PHOS  --  2.3  < >  --   < >  --  1.2*  1.2* 1.1* 0.9* 2.0* 1.3* 1.5* 3.0 2.9  AST 238* 264*  --  255*  --  233*  --   --  201*  --   --   --   --   --   ALT 116* 101*  --  83*  --  71*  --   --  65*  --   --    --   --   --   < > = values in this interval not displayed. Liver Function Tests:  Recent Labs Lab 10/30/13 0355  10/31/13 1006  11/01/13 0500  11/02/13 0400 11/02/13 1600 11/03/13 0445  AST 255*  --  233*  --  201*  --   --   --   --   ALT 83*  --  71*  --  65*  --   --   --   --   ALKPHOS 123*  --  166*  --  179*  --   --   --   --  BILITOT 1.5*  --  2.5*  --  2.1*  --   --   --   --   PROT 5.1*  --  4.8*  --  4.3*  --   --   --   --   ALBUMIN 2.0*  < > 1.7*  < > 1.4*  < > 1.7* 1.7* 1.8*  < > = values in this interval not displayed.  Recent Labs Lab 10/03/2013 1140 10/29/13 1110  LIPASE 47  --   AMYLASE  --  83   No results found for this basename: AMMONIA,  in the last 168 hours CBC:  Recent Labs Lab 10/30/13 0355 10/31/13 0400 11/01/13 0500 11/02/13 0400 11/03/13 0445  WBC 13.9* 11.5* 10.4 9.7 11.5*  NEUTROABS 11.7* 7.8* 6.3  --   --   HGB 13.2 11.5* 10.8* 10.0* 8.7*  HCT 40.4 32.2* 31.6* 29.2* 25.6*  MCV 88.8 84.7 84.5 86.1 85.3  PLT 70* 42* 45* 56* 66*   Cardiac Enzymes:  Recent Labs Lab 10/26/2013 1140 10/31/13 1006  TROPONINI <0.30 <0.30   CBG:  Recent Labs Lab 11/02/13 1532 11/02/13 1938 11/02/13 2351 11/03/13 0334 11/03/13 0711  GLUCAP 149* 177* 193* 165* 171*    Iron Studies: No results found for this basename: IRON, TIBC, TRANSFERRIN, FERRITIN,  in the last 72 hours Studies/Results: Dg Chest Port 1 View  11/03/2013   CLINICAL DATA:  Endotracheal tube replacement.  EXAM: PORTABLE CHEST - 1 VIEW  COMPARISON:  One-view chest 11/02/2013  FINDINGS: The endotracheal tube terminates 3.5 cm above the carina. Bilateral internal jugular catheters are stable. The heart is mildly enlarged. Mild interstitial edema is slightly increased. Bilateral pleural effusions are worse on the left. Associated airspace disease likely reflects atelectasis.  IMPRESSION: 1. Stable to slight increased diffuse edema. 2. The support apparatus is stable. 3. Bilateral  pleural effusions, left greater than right with associated airspace disease. While this likely reflects atelectasis, infection is not excluded, particularly on the left.   Electronically Signed   By: Gennette Pachris  Mattern M.D.   On: 11/03/2013 07:56   Dg Chest Port 1 View  11/02/2013   CLINICAL DATA:  Intubated.  EXAM: PORTABLE CHEST - 1 VIEW  COMPARISON:  Yesterday.  FINDINGS: The endotracheal tube is in satisfactory position. Stable bilateral jugular catheters. Nasogastric tube extending into the stomach. Dense left lower lobe opacity, increased. Interval right basilar linear and patchy opacity. Mildly increased linear density in the right perihilar region and left lower lung zone. Grossly normal sized heart. Thoracic spine degenerative changes.  IMPRESSION: 1. Increased dense left lower lobe atelectasis or pneumonia. 2. Interval right basilar atelectasis. 3. Mildly increased atelectasis elsewhere in both lungs.   Electronically Signed   By: Gordan PaymentSteve  Reid M.D.   On: 11/02/2013 08:50   . amiodarone  150 mg Intravenous Once  . antiseptic oral rinse  15 mL Mouth Rinse QID  . chlorhexidine  15 mL Mouth Rinse BID  . doxycycline (VIBRAMYCIN) IV  100 mg Intravenous Q12H  . feeding supplement (VITAL HIGH PROTEIN)  1,000 mL Per Tube Q24H  . fentaNYL  50 mcg Intravenous Once  . hydrocortisone sod succinate (SOLU-CORTEF) inj  50 mg Intravenous Q6H  . imipenem-cilastatin  250 mg Intravenous Q6H  . insulin aspart  0-15 Units Subcutaneous 6 times per day  . levETIRAcetam  500 mg Per Tube BID  . research study medication  0.25-2 mL Intravenous Q12H  . pantoprazole (PROTONIX) IV  40 mg Intravenous Q24H  .  vancomycin  1,000 mg Intravenous Q24H    BMET    Component Value Date/Time   NA 133* 11/03/2013 0445   K 4.3 11/03/2013 0445   CL 97 11/03/2013 0445   CO2 24 11/03/2013 0445   GLUCOSE 182* 11/03/2013 0445   BUN 19 11/03/2013 0445   CREATININE 1.48* 11/03/2013 0445   CALCIUM 7.5* 11/03/2013 0445   GFRNONAA 36* 11/03/2013 0445    GFRAA 41* 11/03/2013 0445   CBC    Component Value Date/Time   WBC 11.5* 11/03/2013 0445   WBC 4.7 12/10/2012 0855   RBC 3.00* 11/03/2013 0445   RBC 4.59 12/10/2012 0855   HGB 8.7* 11/03/2013 0445   HCT 25.6* 11/03/2013 0445   PLT 66* 11/03/2013 0445   MCV 85.3 11/03/2013 0445   MCH 29.0 11/03/2013 0445   MCH 29.8 12/10/2012 0855   MCHC 34.0 11/03/2013 0445   MCHC 34.1 12/10/2012 0855   RDW 15.5 11/03/2013 0445   RDW 13.8 12/10/2012 0855   LYMPHSABS 2.3 11/01/2013 0500   LYMPHSABS 1.9 12/10/2012 0855   MONOABS 1.4* 11/01/2013 0500   EOSABS 0.1 11/01/2013 0500   EOSABS 0.1 12/10/2012 0855   BASOSABS 0.3* 11/01/2013 0500   BASOSABS 0.0 12/10/2012 0855     Assessment/Plan:  1. AKI in setting of SIRS. oliguric with increasing pressor requirements.  1. initiation of CVVHD without UF on 10/30/13. 2. Keep even and no heparin 3. Improvement of electrolytes 4. Have stopped isotonic bicarb and switched to prismasate to help with K levels 2. Metabolic acidosis- secondary to #1  1. Improved with CVVHD and isotonic bicarb 2. Cont to follow electrolytes  3. Hypokalemia- on on prismasate but will need some Kphos as well  1. Cont with 4K bath 4. Hypocalcemia- was given IV Calcium gluconate gtt with improved Ca (corrected 9.2)  5. Hypophosphatemia- improved following IV K-Phos 6. SIRS- on doxy, imipenem, and vanco and now zosyn an high dose pressors (levophed and vaso, wean per PCCM)  1. Agree with stress dose steroids per PCCM 7. VDRF- per PCCM 8. Possible tick-borne illness (STARI)- on doxy and appreciate ID input although RMSF IgM negative 9. Thrombocytopenia 10. Abnormal LFT's- ?shock liver vs. Ehrlichiosis/rickettsial disease 11. Hyperglycemia/DM- per PCCM 12.   Charle Clear A

## 2013-11-03 NOTE — Progress Notes (Signed)
PULMONARY / CRITICAL CARE MEDICINE   Name: Tracie [name] MRN: [id number] DOB: 12/21/1945    ADMISSION DATE:  07/10/13 CONSULTATION DATE:  6/39/2015  REFERRING MD :  EDP PRIMARY SERVICE: PCCM  CHIEF COMPLAINT:  SOB  BRIEF PATIENT DESCRIPTION: 68 y.o. F transferred from [facility] with septic shock, unclear source. Concern for tick borne illness, MODS.  SIGNIFICANT EVENTS / STUDIES:  6/28 - presented to ED with SOB, treated w rocephin for UTI and sent home with levaquin for bronchitis. 6/29 - back to ED in respiratory distress, hypotensive, lactic acidosis, fever to 104.  [facility]. 6/30 - renal US>>>. Negative for hydronephrosis.  Hepatic steatosis 6/30 - echo - Limited study quality but there appears to be normal biventricular size and systolic function. Impaired relaxation with normal filling pressures 6/30 - doppler legs bilat>>>neg 7/01 - worsening MODS, shock, acidosis 7/01 - CT abdo/pelvis>>>neg acute 7/01 - Ct chest>>>mild left base atx 7/02 - improved levophed 7/03 - remains on levo, vaso, arouses / follows commands   LINES / TUBES: Foley 6/29 >>> 6/29 ETT>>> 6/29 rt Antonito>>> 6/29 fem aline>>>  CULTURES: Blood 6/29 >>> Urine 6/29 >>> RVP 6/29  >>>rhinovirus positive  VP 6/29 >>>  Lyme titer>>>neg  RMSF>>>neg  HIV>>>neg  Ehrlichia AB >> neg  Babesiosis AB >> 7/3 Ehrlichia PCR >>> 7/3 Anaplasma PCR >>>  ANTIBIOTICS: Zosyn 6/29 >>>7/1 Imipenem 7/1>>> Vancomycin 6/29 >>> Doxy 6/29>>>  SUBJECTIVE:  RN reports vaso off and levo down to 4 mcg  VITAL SIGNS: Temp:  [95.9 F (35.5 C)-97.5 F (36.4 C)] 95.9 F (35.5 C) (07/05 1100) Pulse Rate:  [57-84] 66 (07/05 1100) Resp:  [0-27] 22 (07/05 1100) BP: (42-154)/(32-103) 94/48 mmHg (07/05 1100) SpO2:  [88 %-100 %] 94 % (07/05 1100) Arterial Line BP: (69-155)/(38-84) 96/43 mmHg (07/05 1100) FiO2 (%):  [40 %] 40 % (07/05 1100) Weight:  [252 lb 13.9 oz (114.7 kg)] 252 lb 13.9 oz (114.7 kg) (07/05  0400) HEMODYNAMICS: CVP:  [12 mmHg] 12 mmHg VENTILATOR SETTINGS: Vent Mode:  [-] PRVC FiO2 (%):  [40 %] 40 % Set Rate:  [22 bmp] 22 bmp Vt Set:  [550 mL] 550 mL PEEP:  [5 cmH20] 5 cmH20 Plateau Pressure:  [15 cmH20-20 cmH20] 20 cmH20 INTAKE / OUTPUT: Intake/Output     07/04 0701 - 07/05 0700 07/05 0701 - 07/06 0700   I.V. (mL/kg) 2441.1 (21.3) 430.5 (3.8)   NG/GT 1640 260   IV Piggyback 1650 100   Total Intake(mL/kg) 5731.1 (50) 790.5 (6.9)   Urine (mL/kg/hr) 30 (0)    Other 5777 (2.1) 798 (1.4)   Stool 200 (0.1)    Total Output 6007 798   Net -275.9 -7.5          PHYSICAL EXAMINATION: General: Elderly appearing female, responsive Neuro: rass -1, moves all ext equal, follows commands, int drowsy HEENT: ett, mm pink/moist, +jvd Cardiovascular: RRR, no M/R/G.  Lungs: resp's even/non-labored, lungs bilaterally coarse Abdomen: BS low, soft, NT/ND Musculoskeletal: No gross deformities, increased edema. Peripheral pulses +  Dusky toes (vaso response)   LABS:  CBC  Recent Labs Lab 11/01/13 0500 11/02/13 0400 11/03/13 0445  WBC 10.4 9.7 11.5*  HGB 10.8* 10.0* 8.7*  HCT 31.6* 29.2* 25.6*  PLT 45* 56* 66*   Coag's  Recent Labs Lab 10/30/13 0803 10/31/13 0400 11/01/13 0500  APTT 49* >200* 37  INR 1.92* 3.22* 1.45   BMET  Recent Labs Lab 11/02/13 0400 11/02/13 1600 11/03/13 0445  NA 134* 131*  133*  K 3.6* 4.3 4.3  CL 95* 96 97  CO2 26 22 24   BUN 18 17 19   CREATININE 1.49* 1.50* 1.48*  GLUCOSE 168* 203* 182*   Electrolytes  Recent Labs Lab 10/31/13 2310 11/01/13 0500  11/02/13 0400 11/02/13 1600 11/03/13 0445  CALCIUM  --  4.9*  < > 7.4* 8.1* 7.5*  MG 2.0 1.8  --   --   --  2.3  PHOS 1.1* 0.9*  < > 1.5* 3.0 2.9  < > = values in this interval not displayed. Sepsis Markers  Recent Labs Lab 10/06/2013 1955 10/29/13 0020 10/29/13 0500 10/29/13 0556 10/30/13 0355 10/30/13 0945  LATICACIDVEN 5.9* 3.9*  --  4.0*  --  5.7*  PROCALCITON 20.00   --  23.98  --  36.13  --    ABG  Recent Labs Lab 10/31/13 1110 11/01/13 0258 11/01/13 1140  PHART 7.395 7.312* 7.434  PCO2ART 38.5 51.2* 39.4  PO2ART 62.0* 59.7* 91.3   Liver Enzymes  Recent Labs Lab 10/30/13 0355  10/31/13 1006  11/01/13 0500  11/02/13 0400 11/02/13 1600 11/03/13 0445  AST 255*  --  233*  --  201*  --   --   --   --   ALT 83*  --  71*  --  65*  --   --   --   --   ALKPHOS 123*  --  166*  --  179*  --   --   --   --   BILITOT 1.5*  --  2.5*  --  2.1*  --   --   --   --   ALBUMIN 2.0*  < > 1.7*  < > 1.4*  < > 1.7* 1.7* 1.8*  < > = values in this interval not displayed. Cardiac Enzymes  Recent Labs Lab 10/26/2013 1140 10/31/13 1006  TROPONINI <0.30 <0.30  PROBNP 2537.0*  --    Glucose  Recent Labs Lab 11/02/13 1119 11/02/13 1532 11/02/13 1938 11/02/13 2351 11/03/13 0334 11/03/13 0711  GLUCAP 126* 149* 177* 193* 165* 171*    Imaging Dg Chest Port 1 View  11/03/2013   CLINICAL DATA:  Endotracheal tube replacement.  EXAM: PORTABLE CHEST - 1 VIEW  COMPARISON:  One-view chest 11/02/2013  FINDINGS: The endotracheal tube terminates 3.5 cm above the carina. Bilateral internal jugular catheters are stable. The heart is mildly enlarged. Mild interstitial edema is slightly increased. Bilateral pleural effusions are worse on the left. Associated airspace disease likely reflects atelectasis.  IMPRESSION: 1. Stable to slight increased diffuse edema. 2. The support apparatus is stable. 3. Bilateral pleural effusions, left greater than right with associated airspace disease. While this likely reflects atelectasis, infection is not excluded, particularly on the left.   Electronically Signed   By: Gennette Pachris  Mattern M.D.   On: 11/03/2013 07:56   Dg Chest Port 1 View  11/02/2013   CLINICAL DATA:  Intubated.  EXAM: PORTABLE CHEST - 1 VIEW  COMPARISON:  Yesterday.  FINDINGS: The endotracheal tube is in satisfactory position. Stable bilateral jugular catheters. Nasogastric  tube extending into the stomach. Dense left lower lobe opacity, increased. Interval right basilar linear and patchy opacity. Mildly increased linear density in the right perihilar region and left lower lung zone. Grossly normal sized heart. Thoracic spine degenerative changes.  IMPRESSION: 1. Increased dense left lower lobe atelectasis or pneumonia. 2. Interval right basilar atelectasis. 3. Mildly increased atelectasis elsewhere in both lungs.   Electronically Signed   By: Brett CanalesSteve  Azucena Kuba M.D.   On: 11/02/2013 08:50     ASSESSMENT / PLAN:  PULMONARY A: Acute Respiratory Failure Compensated respiratory alkalosis OSA R/o developing PNA - LLL airspace disease  R/O PE - Doppler legs neg, normal RV, low clinical suspicion for PE P:   -NO SBT with high MV needs -Trend PCXR  -full support, 8 cc/kg -PRN follow up ABG   CARDIOVASCULAR A:  Septic Shock - refractory, unclear etiology.  Off epi gtt since 7/2.  Adrenal Insufficiency  P:  -wean pressors to off for MAP >65  -allow pos balance for now -stress steroids added 7/4, pressors weaned significantly   RENAL A:   AG metabolic acidosis - lactate.  Smaller NON AG.  Bicarb d/c'd 7/3 AKI - remains on CVVHD Hypocalcemia P:   -CVVHD per Nephrology -frequent chem -replace lytes as indicated  GASTROINTESTINAL A:   Nutrition Transaminitis - sepsis,. Concern for tick borne illness (neg lyme titer).  CT reasuring no ischemia P:   -tolerating TF -ppi -trend LFT's -add protein supplement 7/5  HEMATOLOGIC A:  DIC Thrombocytopenia - DIC.  No spontaneous bleeding noted.  Post vitamin K x 1.  Improving 7/5 P:  -VTE Proph:  SCD's only. -lamictal held for low platelets -Trend CBC -consider transfuse cryo if bleeding  -Follow coags -may need heme consult  INFECTIOUS A:   Septic Shock - DIC association? Tick bite 10 days ago, r/o tick born illness.  Consider ehrlichiosis or human granulocytic anaplasmosis UTI - resolved (treated  6/28). Rhinovirus P:   -maintain imi, doxy, vanc -ID following -doubt Rhino virus is pathogen -await final viral studies  ENDOCRINE A:   Hyperglycemia  P:   -SSI  NEUROLOGIC A:   Hx of Seizures - lamictal held for low plat Acute encephalopathy Delirium (MIND [place] Enrolment) P:   - Hold outpatient Lamictal (see DERM section below). - Keppra - fent with WUA - Cam score   DERMATOLOGY A: R/o tic born illness  [place] P: - No Bactrim, hold Lamictal. - doxy, abx as above  GLOBAL: -Full Code   Canary Brim, NP-C [facility] Pgr: [phone] or [phone]   Attending:  I have seen and examined the patient with nurse practitioner/resident and agree with the note above.   Shock nearly resolved, vasopressor requirement greatly improved Suspect we will be able to start weaning her this week Mental status seems intact Not sure about likelihood of renal recovery Abx per ID  I have personally obtained a history, examined the patient, evaluated laboratory and imaging results, formulated the assessment and plan and placed orders.  CRITICAL CARE: The patient is critically ill with multiple organ systems failure and requires high complexity decision making for assessment and support, frequent evaluation and titration of therapies, application of advanced monitoring technologies and extensive interpretation of multiple databases. Critical Care Time devoted to patient care services described in this note is 35 minutes.   Heber [place], MD [facility] Pager: [phone] Cell: [phone] If no response, call [phone]

## 2013-11-03 NOTE — Progress Notes (Signed)
[name] for Infectious Disease  Date of Admission:  10/14/2013  Antibiotics: Vancomycin day 7 Imipenem day 5 Doxycycline day 7 Zosyn 3 days  Subjective: Remains intubated  Objective: Temp:  [95.9 F (35.5 C)-97.5 F (36.4 C)] 95.9 F (35.5 C) (07/05 1100) Pulse Rate:  [57-85] 85 (07/05 1206) Resp:  [0-27] 22 (07/05 1206) BP: (42-154)/(32-103) 111/87 mmHg (07/05 1206) SpO2:  [88 %-100 %] 99 % (07/05 1206) Arterial Line BP: (69-155)/(38-84) 96/43 mmHg (07/05 1100) FiO2 (%):  [40 %] 40 % (07/05 1206) Weight:  [252 lb 13.9 oz (114.7 kg)] 252 lb 13.9 oz (114.7 kg) (07/05 0400)  General: some responsiveness Lungs: CTA B, anterior exam Cor: Tachy RR Abdomen: soft, nt, nd Ext: no edema  Lab Results Lab Results  Component Value Date   WBC 11.5* 11/03/2013   HGB 8.7* 11/03/2013   HCT 25.6* 11/03/2013   MCV 85.3 11/03/2013   PLT 66* 11/03/2013    Lab Results  Component Value Date   CREATININE 1.48* 11/03/2013   BUN 19 11/03/2013   NA 133* 11/03/2013   K 4.3 11/03/2013   CL 97 11/03/2013   CO2 24 11/03/2013    Lab Results  Component Value Date   ALT 65* 11/01/2013   AST 201* 11/01/2013   ALKPHOS 179* 11/01/2013   BILITOT 2.1* 11/01/2013      Microbiology: Recent Results (from the past 240 hour(s))  URINE CULTURE     Status: None   Collection Time    10/27/13 12:36 PM      Result Value Ref Range Status   Specimen Description URINE, CLEAN CATCH   Final   Special Requests NONE   Final   Culture  Setup Time     Final   Value: 10/01/2013 00:06     Performed at [place]     Final   Value: NO GROWTH     Performed at [facility]   Culture     Final   Value: NO GROWTH     Performed at [facility]   Report Status 10/29/2013 FINAL   Final  CULTURE, BLOOD (ROUTINE X 2)     Status: None   Collection Time    10/20/2013 12:00 PM      Result Value Ref Range Status   Specimen Description BLOOD RIGHT ANTECUBITAL   Final   Special Requests      Final   Value: BOTTLES DRAWN AEROBIC AND ANAEROBIC AEB=12CC ANA=10CC   Culture  Setup Time     Final   Value: 10/30/2013 14:13     Performed at [facility]   Culture     Final   Value:        BLOOD CULTURE RECEIVED NO GROWTH TO DATE CULTURE WILL BE HELD FOR 5 DAYS BEFORE ISSUING A FINAL NEGATIVE REPORT     Performed at [facility]   Report Status PENDING   Incomplete  CULTURE, BLOOD (ROUTINE X 2)     Status: None   Collection Time    10/29/2013 12:25 PM      Result Value Ref Range Status   Specimen Description BLOOD LEFT HAND   Final   Special Requests BOTTLES DRAWN AEROBIC ONLY [facility]   Final   Culture  Setup Time     Final   Value: 10/30/2013 14:14     Performed at [place]     Final  Value:        BLOOD CULTURE RECEIVED NO GROWTH TO DATE CULTURE WILL BE HELD FOR 5 DAYS BEFORE ISSUING A FINAL NEGATIVE REPORT     Performed at [facility]   Report Status PENDING   Incomplete  URINE CULTURE     Status: None   Collection Time    10/06/2013 12:36 PM      Result Value Ref Range Status   Specimen Description URINE, CATHETERIZED   Final   Special Requests NONE   Final   Culture  Setup Time     Final   Value: 10/11/2013 23:20     Performed at [place]     Final   Value: NO GROWTH     Performed at [facility]   Culture     Final   Value: NO GROWTH     Performed at [facility]   Report Status 10/29/2013 FINAL   Final  URINE CULTURE     Status: None   Collection Time    10/10/2013  6:19 PM      Result Value Ref Range Status   Specimen Description URINE, CATHETERIZED   Final   Special Requests Normal   Final   Culture  Setup Time     Final   Value: 10/27/2013 19:10     Performed at [place]     Final   Value: NO GROWTH     Performed at [facility]   Culture     Final   Value: NO GROWTH     Performed at [facility]   Report Status 10/29/2013 FINAL    Final  MRSA PCR SCREENING     Status: None   Collection Time    10/11/2013  6:19 PM      Result Value Ref Range Status   MRSA by PCR NEGATIVE  NEGATIVE Final   Comment:            The GeneXpert MRSA Assay (FDA     approved for NASAL specimens     only), is one component of a     comprehensive MRSA colonization     surveillance program. It is not     intended to diagnose MRSA     infection nor to guide or     monitor treatment for     MRSA infections.  CULTURE, BLOOD (ROUTINE X 2)     Status: None   Collection Time    09/30/2013  7:55 PM      Result Value Ref Range Status   Specimen Description BLOOD LEFT HAND   Final   Special Requests BOTTLES DRAWN AEROBIC ONLY 1CC   Final   Culture  Setup Time     Final   Value: 10/04/2013 23:16     Performed at [facility]   Culture     Final   Value: NO GROWTH 5 DAYS     Performed at [facility]   Report Status 11/03/2013 FINAL   Final  RESPIRATORY VIRUS PANEL     Status: Abnormal   Collection Time    10/20/2013  8:01 PM      Result Value Ref Range Status   Source - RVPAN NASAL SWAB   Corrected   Comment: CORRECTED ON 07/01 AT 1356: PREVIOUSLY REPORTED AS NASAL SWAB   Respiratory Syncytial Virus A NOT DETECTED   Final  Respiratory Syncytial Virus B NOT DETECTED   Final   Influenza A NOT DETECTED   Final   Influenza B NOT DETECTED   Final   Parainfluenza 1 NOT DETECTED   Final   Parainfluenza 2 NOT DETECTED   Final   Parainfluenza 3 NOT DETECTED   Final   Metapneumovirus NOT DETECTED   Final   Rhinovirus DETECTED (*)  Final   Adenovirus NOT DETECTED   Final   Influenza A H1 NOT DETECTED   Final   Influenza A H3 NOT DETECTED   Final   Comment: (NOTE)           Normal Reference Range for each Analyte: NOT DETECTED     Testing performed using the Luminex xTAG Respiratory Viral Panel test     kit.     This test was developed and its performance characteristics determined     by [facility]. It has not been  cleared or approved by the [place]     Food and Drug Administration. This test is used for clinical purposes.     It should not be regarded as investigational or for research. This     laboratory is certified under the [place] (CLIA) as qualified to perform high complexity     clinical laboratory testing.     Performed at [place], BLOOD (ROUTINE X 2)     Status: None   Collection Time    10/12/2013  8:13 PM      Result Value Ref Range Status   Specimen Description BLOOD LEFT FOREARM   Final   Special Requests BOTTLES DRAWN AEROBIC ONLY 3CC   Final   Culture  Setup Time     Final   Value: 10/25/2013 23:16     Performed at [facility]   Culture     Final   Value: NO GROWTH 5 DAYS     Performed at [facility]   Report Status 11/03/2013 FINAL   Final    Studies/Results: Dg Chest Port 1 View  11/03/2013   CLINICAL DATA:  Endotracheal tube replacement.  EXAM: PORTABLE CHEST - 1 VIEW  COMPARISON:  One-view chest 11/02/2013  FINDINGS: The endotracheal tube terminates 3.5 cm above the carina. Bilateral internal jugular catheters are stable. The heart is mildly enlarged. Mild interstitial edema is slightly increased. Bilateral pleural effusions are worse on the left. Associated airspace disease likely reflects atelectasis.  IMPRESSION: 1. Stable to slight increased diffuse edema. 2. The support apparatus is stable. 3. Bilateral pleural effusions, left greater than right with associated airspace disease. While this likely reflects atelectasis, infection is not excluded, particularly on the left.   Electronically Signed   By: Lawrence Santiago M.D.   On: 11/03/2013 07:56   Dg Chest Port 1 View  11/02/2013   CLINICAL DATA:  Intubated.  EXAM: PORTABLE CHEST - 1 VIEW  COMPARISON:  Yesterday.  FINDINGS: The endotracheal tube is in satisfactory position. Stable bilateral jugular catheters. Nasogastric tube extending into the  stomach. Dense left lower lobe opacity, increased. Interval right basilar linear and patchy opacity. Mildly increased linear density in the right perihilar region and left lower lung zone. Grossly normal sized heart. Thoracic spine degenerative changes.  IMPRESSION: 1. Increased dense left lower lobe atelectasis or pneumonia. 2. Interval right basilar atelectasis. 3. Mildly increased atelectasis elsewhere in both lungs.   Electronically Signed   By: Enrique Sack  M.D.   On: 11/02/2013 08:50    Assessment/Plan: 1) SIRS - no etiology identified.  RMSF is negative IgM, will need convalescent serology in about 2 weeks to confirm.  Doubt other tick borne etiologies.  Erlichia negative.  Rhinovirus not typically going to cause SIRS.  Bacterial etiology most likely.  Continue broad spectrum antibiotics.   2) Thrombocytopenia - no previous Plts to compare, has been present on admission.  May be due to #1, tick illness, sepsis vs chronic.  Has fatty liver, mild splenomegaly.  May have some underlying chronic abnormality.  No known alcohol history.    3) transaminitis - ? Fatty liver, shock liver, ? Chronic.       Scharlene Gloss, [facility] [place] [email or web address] [phone] pager   [phone] cell 11/03/2013, 12:12 PM

## 2013-11-04 ENCOUNTER — Inpatient Hospital Stay (HOSPITAL_COMMUNITY): Payer: Medicare HMO

## 2013-11-04 LAB — GLUCOSE, CAPILLARY
GLUCOSE-CAPILLARY: 137 mg/dL — AB (ref 70–99)
GLUCOSE-CAPILLARY: 159 mg/dL — AB (ref 70–99)
GLUCOSE-CAPILLARY: 162 mg/dL — AB (ref 70–99)
Glucose-Capillary: 196 mg/dL — ABNORMAL HIGH (ref 70–99)
Glucose-Capillary: 170 mg/dL — ABNORMAL HIGH (ref 70–99)
Glucose-Capillary: 138 mg/dL — ABNORMAL HIGH (ref 70–99)

## 2013-11-04 LAB — RENAL FUNCTION PANEL
ALBUMIN: 1.9 g/dL — AB (ref 3.5–5.2)
ANION GAP: 13 (ref 5–15)
Albumin: 2 g/dL — ABNORMAL LOW (ref 3.5–5.2)
Anion gap: 12 (ref 5–15)
BUN: 28 mg/dL — AB (ref 6–23)
BUN: 38 mg/dL — ABNORMAL HIGH (ref 6–23)
CHLORIDE: 102 meq/L (ref 96–112)
CO2: 23 mEq/L (ref 19–32)
CO2: 22 meq/L (ref 19–32)
CREATININE: 1.37 mg/dL — AB (ref 0.50–1.10)
Calcium: 6.8 mg/dL — ABNORMAL LOW (ref 8.4–10.5)
Calcium: 6.9 mg/dL — ABNORMAL LOW (ref 8.4–10.5)
Chloride: 100 mEq/L (ref 96–112)
Creatinine, Ser: 1.65 mg/dL — ABNORMAL HIGH (ref 0.50–1.10)
GFR calc Af Amer: 45 mL/min — ABNORMAL LOW (ref >90)
GFR calc non Af Amer: 39 mL/min — ABNORMAL LOW (ref >90)
GFR calc non Af Amer: 31 mL/min — ABNORMAL LOW (ref >90)
GFR, EST AFRICAN AMERICAN: 36 mL/min — AB (ref >90)
GLUCOSE: 134 mg/dL — AB (ref 70–99)
Glucose, Bld: 170 mg/dL — ABNORMAL HIGH (ref 70–99)
PHOSPHORUS: 3 mg/dL (ref 2.3–4.6)
POTASSIUM: 4.5 meq/L (ref 3.7–5.3)
Phosphorus: 3.6 mg/dL (ref 2.3–4.6)
Potassium: 4.2 mEq/L (ref 3.7–5.3)
SODIUM: 137 meq/L (ref 137–147)
Sodium: 135 mEq/L — ABNORMAL LOW (ref 137–147)

## 2013-11-04 LAB — CBC
HEMATOCRIT: 22.3 % — AB (ref 36.0–46.0)
Hemoglobin: 7.5 g/dL — ABNORMAL LOW (ref 12.0–15.0)
MCH: 29.5 pg (ref 26.0–34.0)
MCHC: 33.6 g/dL (ref 30.0–36.0)
MCV: 87.8 fL (ref 78.0–100.0)
PLATELETS: 64 10*3/uL — AB (ref 150–400)
RBC: 2.54 MIL/uL — ABNORMAL LOW (ref 3.87–5.11)
RDW: 15.3 % (ref 11.5–15.5)
WBC: 8.2 10*3/uL (ref 4.0–10.5)

## 2013-11-04 LAB — MAGNESIUM: Magnesium: 2.4 mg/dL (ref 1.5–2.5)

## 2013-11-04 LAB — VANCOMYCIN, TROUGH: Vancomycin Tr: 22.8 ug/mL — ABNORMAL HIGH (ref 10.0–20.0)

## 2013-11-04 MED ORDER — ALTEPLASE 100 MG IV SOLR
4.0000 mg | Freq: Once | INTRAVENOUS | Status: AC
  Administered 2013-11-04: 4 mg
  Filled 2013-11-04: qty 4

## 2013-11-04 MED ORDER — FENTANYL CITRATE 0.05 MG/ML IJ SOLN: 12.5000 ug | INTRAMUSCULAR | Status: DC | PRN

## 2013-11-04 MED ORDER — PANTOPRAZOLE SODIUM 40 MG IV SOLR
40.0000 mg | Freq: Every day | INTRAVENOUS | Status: DC
  Administered 2013-11-04 – 2013-11-06 (×3): 40 mg via INTRAVENOUS
  Filled 2013-11-04 (×4): qty 40

## 2013-11-04 MED ORDER — VANCOMYCIN HCL IN DEXTROSE 750-5 MG/150ML-% IV SOLN
750.0000 mg | INTRAVENOUS | Status: DC
  Administered 2013-11-04: 750 mg via INTRAVENOUS
  Filled 2013-11-04 (×2): qty 150

## 2013-11-04 MED ORDER — LEVETIRACETAM IN NACL 500 MG/100ML IV SOLN
500.0000 mg | Freq: Two times a day (BID) | INTRAVENOUS | Status: DC
  Administered 2013-11-04 – 2013-11-07 (×6): 500 mg via INTRAVENOUS
  Filled 2013-11-04 (×8): qty 100

## 2013-11-04 MED ORDER — SODIUM CHLORIDE 0.9 % IV SOLN: INTRAVENOUS | Status: DC

## 2013-11-04 NOTE — Procedures (Signed)
Extubation Procedure Note  Patient Details:   Name: Particia JasperBrenda B Kroner DOB: 05/20/1945 MRN: [id number]    Evaluation  O2 sats: stable throughout Complications: No apparent complications Patient did tolerate procedure well. Bilateral Breath Sounds: Clear;Diminished Suctioning: Airway Yes  Extubated pt. With no complications. Pt placed on 4LNC with humidification. Pt is able to speak. Will continue to monitor ICU  Sharene SkeansSilva, Jorje Vanatta C 11/04/2013, 12:04 PM

## 2013-11-04 NOTE — Progress Notes (Signed)
Patient ID: Tracie [name], female   DOB: 08/06/1945, 68 y.o.   MRN: [id number]  [facility] Progress Note    Assessment/ Plan:   1. AKI in setting of SIRS: remains anuric with intermittent pressor requirements. No evidence of renal recovery, we'll continue CVVH for now given hemodynamic instability. She is not on anticoagulants due to her thrombocytopenia-may need to consider citrate in the near future if recurrently clots CVVH filter. Keeping fluid even for now. Metabolic acidosis and potassium abnormalities corrected at this time on CVVH. Prognosis guarded. 2. Hypocalcemia- was given IV Calcium gluconate gtt with improved Ca (corrected 8.5)  3. SIRS- etiology remains unclear-suspected bacterial etiology probably associated with tick bite (serologies so far negative)-empirically on doxy, imipenem, and vanco with intermittent pressors  4. VDRF- per PCCM 5. Thrombocytopenia: baseline platelet count not known-suspected to be associated with sepsis versus tick bite 6. Abnormal LFT's- ?shock liver vs. Ehrlichiosis/rickettsial disease 7. Hyperglycemia/DM- per PCCM  Subjective:   No acute events overnight, remains intermittently dependent on pressors (usually following sedation bolus). CVVH currently on hold due to problems with catheter-awaiting TPA instillation   Objective:   BP 93/43  Pulse 77  Temp(Src) 96.9 F (36.1 C) (Core (Comment))  Resp 22  Ht 5\' 8"  (1.727 m)  Wt 114.9 kg (253 lb 4.9 oz)  BMI 38.52 kg/m2  SpO2 97%  Intake/Output Summary (Last 24 hours) at 11/04/13 0713 Last data filed at 11/04/13 0700  Gross per 24 hour  Intake 4940.77 ml  Output   4283 ml  Net 657.77 ml   Weight change: 0.2 kg (7.1 oz)  Physical Exam: WUJ:WJXBJYNWGGen:intubated, sedated NFA:OZHYQCVS:pulse regular in rate and rhythm, S1 and S2 normal Resp:coarse breath sounds bilaterally-on ventilator MVH:QIONAbd:soft, obese, nontender GEX:BMWUXExt:trace pedal edema/1+ edema lower back. Ischemic toes  Imaging: Dg Chest Port 1  View  11/03/2013   CLINICAL DATA:  Endotracheal tube replacement.  EXAM: PORTABLE CHEST - 1 VIEW  COMPARISON:  One-view chest 11/02/2013  FINDINGS: The endotracheal tube terminates 3.5 cm above the carina. Bilateral internal jugular catheters are stable. The heart is mildly enlarged. Mild interstitial edema is slightly increased. Bilateral pleural effusions are worse on the left. Associated airspace disease likely reflects atelectasis.  IMPRESSION: 1. Stable to slight increased diffuse edema. 2. The support apparatus is stable. 3. Bilateral pleural effusions, left greater than right with associated airspace disease. While this likely reflects atelectasis, infection is not excluded, particularly on the left.   Electronically Signed   By: Gennette Pachris  Mattern M.D.   On: 11/03/2013 07:56    Labs: BMET  Recent Labs Lab 11/01/13 1558 11/01/13 2009 11/02/13 0400 11/02/13 1600 11/03/13 0445 11/03/13 1600 11/04/13 0442  NA 133* 134* 134* 131* 133* 133* 135*  K 3.6* 3.5* 3.6* 4.3 4.3 4.3 4.2  CL 94* 96 95* 96 97 97 100  CO2 28 27 26 22 24 23 23   GLUCOSE 179* 140* 168* 203* 182* 187* 170*  BUN 20 20 18 17 19 22  28*  CREATININE 1.54* 1.51* 1.49* 1.50* 1.48* 1.39* 1.37*  CALCIUM 6.1* 6.5* 7.4* 8.1* 7.5* 7.2* 6.8*  PHOS 2.0* 1.3* 1.5* 3.0 2.9 2.7 3.0   CBC  Recent Labs Lab 10/29/13 1110 10/30/13 0355 10/31/13 0400 11/01/13 0500 11/02/13 0400 11/03/13 0445 11/04/13 0442  WBC 7.8 13.9* 11.5* 10.4 9.7 11.5* 8.2  NEUTROABS 6.9 11.7* 7.8* 6.3  --   --   --   HGB 12.6 13.2 11.5* 10.8* 10.0* 8.7* 7.5*  HCT 37.4 40.4 32.2* 31.6*  29.2* 25.6* 22.3*  MCV 87.8 88.8 84.7 84.5 86.1 85.3 87.8  PLT 42* 70* 42* 45* 56* 66* 64*    Medications:    . alteplase  4 mg Intracatheter Once  . amiodarone  150 mg Intravenous Once  . antiseptic oral rinse  15 mL Mouth Rinse QID  . chlorhexidine  15 mL Mouth Rinse BID  . doxycycline (VIBRAMYCIN) IV  100 mg Intravenous Q12H  . feeding supplement (PRO-STAT SUGAR  FREE 64)  30 mL Oral TID WC  . feeding supplement (VITAL HIGH PROTEIN)  1,000 mL Per Tube Q24H  . fentaNYL  50 mcg Intravenous Once  . hydrocortisone sod succinate (SOLU-CORTEF) inj  50 mg Intravenous Q6H  . imipenem-cilastatin  250 mg Intravenous Q6H  . insulin aspart  0-15 Units Subcutaneous 6 times per day  . levETIRAcetam  500 mg Per Tube BID  . research study medication  0.25-2 mL Intravenous Q12H  . pantoprazole sodium  40 mg Per Tube QHS  . vancomycin  1,000 mg Intravenous Q24H   Zetta BillsJay Konstantin Lehnen, MD 11/04/2013, 7:13 AM

## 2013-11-04 NOTE — Care Management Note (Signed)
    Page 1 of 1   11/04/2013     2:34:01 PM CARE MANAGEMENT NOTE 11/04/2013  Patient:  Tracie [name],Tracie [name]   Account Number:  [id number]  Date Initiated:  10/29/2013  Documentation initiated by:  [facility]Werner Labella  Subjective/Objective Assessment:   Admitted with sepsis - intubated     Action/Plan:   Anticipated DC Date:  11/05/2013   Anticipated DC Plan:  HOME W HOME HEALTH SERVICES      DC Planning Services  CM consult      Choice offered to / List presented to:             Status of service:  In process, will continue to follow Medicare Important Message given?   (If response is "NO", the following Medicare IM given date fields will be blank) Date Medicare IM given:   Medicare IM given by:   Date Additional Medicare IM given:   Additional Medicare IM given by:    Discharge Disposition:    Per UR Regulation:  Reviewed for med. necessity/level of care/duration of stay  If discussed at Long Length of Stay Meetings, dates discussed:    Comments:  ContactDallie Piles:  Weinmann,Bobby Spouse [phone]   [phone]  11-04-13 2:30pm Avie ArenasSarah Barkley Kratochvil, RNBSN [phone]- [phone] Extubated today.  Remains on CRRT and pressors.  CM will continue to follow.  Will need PT/OT when able.

## 2013-11-04 NOTE — Progress Notes (Signed)
NUTRITION FOLLOW UP  Intervention:    Diet advancement per MD, recommend regular diet.  RD to add supplements as needed once diet advanced to meet nutrition needs.  Nutrition Dx:   Inadequate oral intake related to inability to eat as evidenced by NPO status. Ongoing.  Goal:   Enteral nutrition to provide 60-70% of estimated calorie needs (22-25 kcals/kg ideal body weight) and 100% of estimated protein needs, based on ASPEN guidelines for permissive underfeeding in critically ill obese individuals  Monitor:   Diet advancement, PO intake, labs, weight trend.  Assessment:   68 y.o. F transferred from [facility] on 6/29 with septic shock, unclear source, concern for tick-borne illness, ruled out.  Discussed patient in ICU rounds today. Patient was extubated this morning. Still receiving CRRT with goal negative fluid balance. TF off since extubation.  Height: Ht Readings from Last 1 Encounters:  10-12-13 5\' 8"  (1.727 m)    Weight Status:  Up with positive fluid status Wt Readings from Last 1 Encounters:  11/04/13 253 lb 4.9 oz (114.9 kg)  10/29/13  223 lb 1.7 oz (101.2 kg)   Re-estimated needs:  Kcal: 1700-1900 Protein: 120-130 gm Fluid: 1.8 L  Skin: no issues  Diet Order: NPO   Intake/Output Summary (Last 24 hours) at 11/04/13 1417 Last data filed at 11/04/13 1400  Gross per 24 hour  Intake 4472.07 ml  Output   3252 ml  Net 1220.07 ml    Last BM: 7/5   Labs:   Recent Labs Lab 11/01/13 0500  11/03/13 0445 11/03/13 1600 11/04/13 0442  NA 138  < > 133* 133* 135*  K 2.8*  < > 4.3 4.3 4.2  CL 101  < > 97 97 100  CO2 24  < > 24 23 23   BUN 23  < > 19 22 28*  CREATININE 1.59*  < > 1.48* 1.39* 1.37*  CALCIUM 4.9*  < > 7.5* 7.2* 6.8*  MG 1.8  --  2.3  --  2.4  PHOS 0.9*  < > 2.9 2.7 3.0  GLUCOSE 145*  < > 182* 187* 170*  < > = values in this interval not displayed.  CBG (last 3)   Recent Labs  11/03/13 1930 11/03/13 2340 11/04/13 0351  GLUCAP 160* 162*  159*    Scheduled Meds: . antiseptic oral rinse  15 mL Mouth Rinse QID  . chlorhexidine  15 mL Mouth Rinse BID  . doxycycline (VIBRAMYCIN) IV  100 mg Intravenous Q12H  . hydrocortisone sod succinate (SOLU-CORTEF) inj  50 mg Intravenous Q6H  . imipenem-cilastatin  250 mg Intravenous Q6H  . insulin aspart  0-15 Units Subcutaneous 6 times per day  . levETIRAcetam  500 mg Per Tube BID  . research study medication  0.25-2 mL Intravenous Q12H  . pantoprazole sodium  40 mg Per Tube QHS  . vancomycin  750 mg Intravenous Q24H    Continuous Infusions: . sodium chloride 10 mL/hr at 11/04/13 1015  . norepinephrine (LEVOPHED) Adult infusion Stopped (11/04/13 1300)  . dialysis replacement fluid (prismasate) 500 mL/hr at 11/04/13 0558  . dialysis replacement fluid (prismasate) 400 mL/hr at 11/03/13 2307  . dialysate (PRISMASATE) 1,500 mL/hr at 11/04/13 1402    Joaquin CourtsKimberly Jaysten Essner, RD, LDN, CNSC Pager [phone] After Hours Pager [phone]

## 2013-11-04 NOTE — Progress Notes (Signed)
PULMONARY / CRITICAL CARE MEDICINE   Name: Tracie [name] MRN: [id number] DOB: 04/23/1946    ADMISSION DATE:  10/27/2013  PRIMARY SERVICE: PCCM  CHIEF COMPLAINT:  SOB  BRIEF PATIENT DESCRIPTION: 68 y.o. F transferred from [facility] with septic shock, unclear source. Concern for tick borne illness, MODS.  SIGNIFICANT EVENTS / STUDIES:  6/28 - presented to ED with SOB, treated w rocephin for UTI and sent home with levaquin for bronchitis. 6/29 - back to ED in respiratory distress, hypotensive, lactic acidosis, fever to 104.  [facility] 6/30 - renal US>>>. Negative for hydronephrosis.  Hepatic steatosis 6/30 - echo - Limited study quality but there appears to be normal biventricular size and systolic function. Impaired relaxation with normal filling pressures 6/30 - doppler legs bilat>>>neg 7/1 - worsening MODS, shock, acidosis 7/1 - CT abdo/pelvis>>>neg acute 7/1 - Ct chest>>>mild left base atx 7/2 - improved levophed 7/3 - remains on levo, vaso, arouses / follows commands  7/6 - Extubated, remained on minimal levophed, goal negative fluid balance  LINES / TUBES: Foley 6/29 >>> 7/6 6/29 ETT>>> 7/6 6/29 R  CVL >>  6/29 fem aline>>> 7/6  CULTURES: Blood 6/29 >>> negative Urine 6/29 >>> negative RVP 6/29  >>>rhinovirus positive  Tick-borne >> negative  ANTIBIOTICS: Zosyn 6/29 >>>7/1 Imipenem 7/1>>> Vancomycin 6/29 >>> Doxy 6/29>>>  SUBJECTIVE: Remains somewhat sedated but tolerating SBT, will extubate this am  VITAL SIGNS: Temp:  [95.9 F (35.5 C)-97.7 F (36.5 C)] 97.7 F (36.5 C) (07/06 1000) Pulse Rate:  [38-93] 80 (07/06 1000) Resp:  [15-24] 22 (07/06 1000) BP: (54-121)/(23-87) 113/52 mmHg (07/06 1000) SpO2:  [94 %-100 %] 98 % (07/06 1000) Arterial Line BP: (78-140)/(34-65) 114/50 mmHg (07/06 1000) FiO2 (%):  [30 %-40 %] 30 % (07/06 1030) Weight:  [253 lb 4.9 oz (114.9 kg)] 253 lb 4.9 oz (114.9 kg) (07/06 0400)  HEMODYNAMICS: CVP:  [12 mmHg] 12  mmHg  VENTILATOR SETTINGS: Vent Mode:  [-] CPAP;PSV FiO2 (%):  [30 %-40 %] 30 % Set Rate:  [22 bmp] 22 bmp Vt Set:  [550 mL] 550 mL PEEP:  [5 cmH20] 5 cmH20 Pressure Support:  [5 cmH20] 5 cmH20 Plateau Pressure:  [17 cmH20-22 cmH20] 18 cmH20  INTAKE / OUTPUT: Intake/Output     07/05 0701 - 07/06 0700 07/06 0701 - 07/07 0700   I.V. (mL/kg) 2200.8 (19.2) 203.1 (1.8)   NG/GT 1640 370   IV Piggyback 1100 225   Total Intake(mL/kg) 4940.8 (43) 798.1 (6.9)   Urine (mL/kg/hr) 10 (0) 10 (0)   Other 4173 (1.5)    Stool 100 (0)    Total Output 4283 10   Net +657.8 +788.1         PHYSICAL EXAMINATION: General: Elderly appearing female, responsive Neuro: no focal deficits noted, follows commands, int drowsy HEENT: ett, mm pink/moist Cardiovascular: RRR, no M/R/G.  Lungs: resp's even/non-labored, lungs bilaterally coarse Abdomen: BS low, soft, NT/ND Musculoskeletal: No gross deformities, Pitting edema to bilateral upper and lower extremities. Peripheral pulses +  Dusky toes    LABS: I have reviewed all new lab results and relevant findings are noted in A/P.  Imaging CXR: Decrease in pulmonary edema. Residual atelectasis/consolidation in both lower lungs, left greater than right.   ASSESSMENT / PLAN:  PULMONARY A: Acute Respiratory Failure Compensated respiratory alkalosis OSA Possible PNA - LLL airspace disease  P:   - extubate, stopping sedation - trend CXR   CARDIOVASCULAR A:  Septic Shock - refractory, unclear etiology.  Off epi gtt since 7/2.  Adrenal Insufficiency  P:  -wean pressors to off for MAP >65  -continue stress steroids   RENAL A:   AG metabolic acidosis - lactate.  Smaller NON AG.  Bicarb d/c'd 7/3 AKI - remains on CVVHD Hypocalcemia Volume overload P:   -CVVHD per Nephrology, goal negative fluid balance -frequent chem -replace lytes as indicated  GASTROINTESTINAL A:   Nutrition Transaminitis - sepsis,. Concern for tick borne illness (neg  lyme titer).  CT reasuring no ischemia P:   -tolerating TF -ppi -trend LFT's  HEMATOLOGIC A:  DIC Thrombocytopenia - DIC.  No spontaneous bleeding noted.  Post vitamin K x 1. Improving P:  -VTE Proph:  SCD's only. -lamictal held for low platelets -Trend CBC -consider transfuse cryo if bleeding  -Follow coags  INFECTIOUS A:   Septic Shock - DIC association? Tick bite 10 days ago, r/o tick born illness. RMSF titers negative but will need repeat in 2 weeks to r/o UTI - resolved (treated 6/28). Rhinovirus P:   Abx and cx as above  ENDOCRINE A:   Hyperglycemia  P:   -SSI  NEUROLOGIC A:   Hx of Seizures - lamictal held for low plat Acute encephalopathy, resolved Delirium (MIND [place] Enrol[place]) P:   - Hold outpatient Lamictal (see DERM section below). - Keppra - Cam score   DERMATOLOGY A: R/o tic born illness  [place] P: - No Bactrim, hold Lamictal. - doxy, abx as above  GLOBAL: -Full Code  Beverely LowElena Adamo, MD, MPH [facility] PGY-2 11/04/2013 11:55 AM   PCCM ATTENDING: I have interviewed and examined the patient and reviewed the database. I have formulated the assessment and plan as reflected in the note above with amendments made by me. 40 mins of direct critical care time provided. Passed SBT and extubated. Appears to be tolerating well initially. Family updated in detail @ bedside  Tracie Fischeravid Lidie Glade, MD;  PCCM service; Mobile [phone]

## 2013-11-04 NOTE — Progress Notes (Signed)
Pt previously on Fentanyl drip (1910mcg/ml); 80 ml of 250 ml bag wasted in sink; witnessed by Gerre PebblesMavis Nyako, RN. Burnard BuntingKatrice Boen Sterbenz, RN

## 2013-11-04 NOTE — Progress Notes (Signed)
[facility]  Vancomycin day 8  Imipenem day 6  Doxycycline day 8  Zosyn 3 days   Subjective: nA   Antibiotics:  Anti-infectives   Start     Dose/Rate Route Frequency Ordered Stop   11/04/13 1500  vancomycin (VANCOCIN) IVPB 750 mg/150 ml premix     750 mg 150 mL/hr over 60 Minutes Intravenous Every 24 hours 11/04/13 1207     11/01/13 1200  vancomycin (VANCOCIN) IVPB 1000 mg/200 mL premix  Status:  Discontinued     1,000 mg 200 mL/hr over 60 Minutes Intravenous Every 24 hours 11/01/13 0845 11/04/13 1206   11/01/13 1128  vancomycin (VANCOCIN) 1,500 mg in sodium chloride 0.9 % 500 mL IVPB  Status:  Discontinued     1,500 mg 250 mL/hr over 120 Minutes Intravenous Every 24 hours 10/31/13 1138 11/01/13 0845   10/31/13 1000  vancomycin (VANCOCIN) 1,500 mg in sodium chloride 0.9 % 500 mL IVPB  Status:  Discontinued     1,500 mg 250 mL/hr over 120 Minutes Intravenous Every 48 hours 10/30/13 0802 10/31/13 1138   10/30/13 1400  imipenem-cilastatin (PRIMAXIN) 250 mg in sodium chloride 0.9 % 100 mL IVPB     250 mg 200 mL/hr over 30 Minutes Intravenous Every 6 hours 10/30/13 1108     10/29/13 1100  levofloxacin (LEVAQUIN) IVPB 750 mg  Status:  Discontinued     750 mg 100 mL/hr over 90 Minutes Intravenous Every 48 hours 10/29/13 1000 10/29/13 1002   10/29/13 1100  doxycycline (VIBRAMYCIN) 100 mg in dextrose 5 % 250 mL IVPB     100 mg 125 mL/hr over 120 Minutes Intravenous Every 12 hours 10/29/13 1002     10/29/13 1000  vancomycin (VANCOCIN) IVPB 750 mg/150 ml premix  Status:  Discontinued     750 mg 150 mL/hr over 60 Minutes Intravenous Every 12 hours 10/03/2013 1851 10/30/13 0802   09/30/2013 2200  piperacillin-tazobactam (ZOSYN) IVPB 3.375 g  Status:  Discontinued     3.375 g 12.5 mL/hr over 240 Minutes Intravenous 3 times per day 10/24/2013 1851 10/30/13 1031   10/29/2013 1900  vancomycin (VANCOCIN) IVPB 1000 mg/200 mL premix     1,000 mg 200 mL/hr over 60  Minutes Intravenous NOW 10/12/2013 1851 10/02/2013 2120   09/30/2013 1400  vancomycin (VANCOCIN) IVPB 1000 mg/200 mL premix     1,000 mg 200 mL/hr over 60 Minutes Intravenous  Once 10/17/2013 1346 10/03/2013 1626   10/13/2013 1345  piperacillin-tazobactam (ZOSYN) IVPB 3.375 g     3.375 g 100 mL/hr over 30 Minutes Intravenous  Once 10/19/2013 1346 10/16/2013 1501      Medications: Scheduled Meds: . antiseptic oral rinse  15 mL Mouth Rinse QID  . chlorhexidine  15 mL Mouth Rinse BID  . doxycycline (VIBRAMYCIN) IV  100 mg Intravenous Q12H  . hydrocortisone sod succinate (SOLU-CORTEF) inj  50 mg Intravenous Q6H  . imipenem-cilastatin  250 mg Intravenous Q6H  . insulin aspart  0-15 Units Subcutaneous 6 times per day  . levETIRAcetam  500 mg Per Tube BID  . research study medication  0.25-2 mL Intravenous Q12H  . pantoprazole sodium  40 mg Per Tube QHS  . vancomycin  750 mg Intravenous Q24H   Continuous Infusions: . sodium chloride 10 mL/hr at 11/04/13 1015  . norepinephrine (LEVOPHED) Adult infusion Stopped (11/04/13 1300)  . dialysis replacement fluid (prismasate) 500 mL/hr at 11/04/13 0558  . dialysis replacement fluid (prismasate) 400 mL/hr at 11/03/13 2307  .  dialysate (PRISMASATE) 1,500 mL/hr at 11/04/13 1402   PRN Meds:.fentaNYL, heparin, sodium chloride    Objective: Weight change: 7.1 oz (0.2 kg)  Intake/Output Summary (Last 24 hours) at 11/04/13 1452 Last data filed at 11/04/13 1400  Gross per 24 hour  Intake 4472.07 ml  Output   3252 ml  Net 1220.07 ml   Blood pressure 115/64, pulse 87, temperature 97.1 F (36.2 C), temperature source Core (Comment), resp. rate 17, height $RemoveBe'5\' 8"'dpfECQNAb$  (1.727 m), weight 253 lb 4.9 oz (114.9 kg), SpO2 100.00%. Temp:  [96.1 F (35.6 C)-97.8 F (36.6 C)] 97.1 F (36.2 C) (07/06 1430) Pulse Rate:  [38-101] 87 (07/06 1430) Resp:  [13-24] 17 (07/06 1430) BP: (54-126)/(25-82) 115/64 mmHg (07/06 1400) SpO2:  [90 %-100 %] 100 % (07/06 1430) Arterial Line  BP: (78-140)/(34-64) 121/56 mmHg (07/06 1430) FiO2 (%):  [30 %-40 %] 30 % (07/06 1030) Weight:  [253 lb 4.9 oz (114.9 kg)] 253 lb 4.9 oz (114.9 kg) (07/06 0400)  Physical Exam: General:on the ventilator, restrained, but much more alert, better color HEENT: , EOMI CVS tachycardic normal r,  no murmur rubs or gallops Chest:  rhonchi Abdomen: soft slightly distended not clear if she is tendernormal bowel sounds, Extremities: no  clubbing or edema noted bilaterally Skin: did not get good look at rash today Neuro: nonfocal  CBC:  Recent Labs Lab 10/23/2013 2055  10/30/13 0803 10/31/13 0400 11/01/13 0500 11/02/13 0400 11/03/13 0445 11/04/13 0442  HGB  --   < >  --  11.5* 10.8* 10.0* 8.7* 7.5*  HCT  --   < >  --  32.2* 31.6* 29.2* 25.6* 22.3*  PLT  --   < >  --  42* 45* 56* 66* 64*  INR 1.49  --  1.92* 3.22* 1.45  --   --   --   APTT 48*  --  49* >200* 37  --   --   --   < > = values in this interval not displayed.   BMET  Recent Labs  11/03/13 1600 11/04/13 0442  NA 133* 135*  K 4.3 4.2  CL 97 100  CO2 23 23  GLUCOSE 187* 170*  BUN 22 28*  CREATININE 1.39* 1.37*  CALCIUM 7.2* 6.8*     Liver Panel   Recent Labs  11/03/13 1600 11/04/13 0442  ALBUMIN 1.9* 1.9*       Sedimentation Rate No results found for this basename: ESRSEDRATE,  in the last 72 hours C-Reactive Protein No results found for this basename: CRP,  in the last 72 hours  Micro Results: Recent Results (from the past 240 hour(s))  URINE CULTURE     Status: None   Collection Time    10/27/13 12:36 PM      Result Value Ref Range Status   Specimen Description URINE, CLEAN CATCH   Final   Special Requests NONE   Final   Culture  Setup Time     Final   Value: 10/01/2013 00:06     Performed at [place]     Final   Value: NO GROWTH     Performed at [facility]   Culture     Final   Value: NO GROWTH     Performed at [facility]   Report Status  10/29/2013 FINAL   Final  CULTURE, BLOOD (ROUTINE X 2)     Status: None   Collection Time    10/16/2013 12:00  PM      Result Value Ref Range Status   Specimen Description BLOOD RIGHT ANTECUBITAL   Final   Special Requests     Final   Value: BOTTLES DRAWN AEROBIC AND ANAEROBIC AEB=12CC ANA=10CC   Culture  Setup Time     Final   Value: 10/30/2013 14:13     Performed at [facility]   Culture     Final   Value:        BLOOD CULTURE RECEIVED NO GROWTH TO DATE CULTURE WILL BE HELD FOR 5 DAYS BEFORE ISSUING A FINAL NEGATIVE REPORT     Performed at [facility]   Report Status PENDING   Incomplete  CULTURE, BLOOD (ROUTINE X 2)     Status: None   Collection Time    10/13/2013 12:25 PM      Result Value Ref Range Status   Specimen Description BLOOD LEFT HAND   Final   Special Requests BOTTLES DRAWN AEROBIC ONLY [place]   Final   Culture  Setup Time     Final   Value: 10/30/2013 14:14     Performed at [facility]   Culture     Final   Value:        BLOOD CULTURE RECEIVED NO GROWTH TO DATE CULTURE WILL BE HELD FOR 5 DAYS BEFORE ISSUING A FINAL NEGATIVE REPORT     Performed at [facility]   Report Status PENDING   Incomplete  URINE CULTURE     Status: None   Collection Time    10/17/2013 12:36 PM      Result Value Ref Range Status   Specimen Description URINE, CATHETERIZED   Final   Special Requests NONE   Final   Culture  Setup Time     Final   Value: 10/01/2013 23:20     Performed at [place]     Final   Value: NO GROWTH     Performed at [facility]   Culture     Final   Value: NO GROWTH     Performed at [facility]   Report Status 10/29/2013 FINAL   Final  URINE CULTURE     Status: None   Collection Time    10/08/2013  6:19 PM      Result Value Ref Range Status   Specimen Description URINE, CATHETERIZED   Final   Special Requests Normal   Final   Culture  Setup Time     Final   Value: 10/27/2013 19:10      Performed at [place]     Final   Value: NO GROWTH     Performed at [facility]   Culture     Final   Value: NO GROWTH     Performed at [facility]   Report Status 10/29/2013 FINAL   Final  MRSA PCR SCREENING     Status: None   Collection Time    10/03/2013  6:19 PM      Result Value Ref Range Status   MRSA by PCR NEGATIVE  NEGATIVE Final   Comment:            The GeneXpert MRSA Assay (FDA     approved for NASAL specimens     only), is one component of a     comprehensive MRSA colonization     surveillance program. It is not  intended to diagnose MRSA     infection nor to guide or     monitor treatment for     MRSA infections.  CULTURE, BLOOD (ROUTINE X 2)     Status: None   Collection Time    10/19/2013  7:55 PM      Result Value Ref Range Status   Specimen Description BLOOD LEFT HAND   Final   Special Requests BOTTLES DRAWN AEROBIC ONLY 1CC   Final   Culture  Setup Time     Final   Value: 10/24/2013 23:16     Performed at [facility]   Culture     Final   Value: NO GROWTH 5 DAYS     Performed at [facility]   Report Status 11/03/2013 FINAL   Final  RESPIRATORY VIRUS PANEL     Status: Abnormal   Collection Time    10/14/2013  8:01 PM      Result Value Ref Range Status   Source - RVPAN NASAL SWAB   Corrected   Comment: CORRECTED ON 07/01 AT 1356: PREVIOUSLY REPORTED AS NASAL SWAB   Respiratory Syncytial Virus A NOT DETECTED   Final   Respiratory Syncytial Virus B NOT DETECTED   Final   Influenza A NOT DETECTED   Final   Influenza B NOT DETECTED   Final   Parainfluenza 1 NOT DETECTED   Final   Parainfluenza 2 NOT DETECTED   Final   Parainfluenza 3 NOT DETECTED   Final   Metapneumovirus NOT DETECTED   Final   Rhinovirus DETECTED (*)  Final   Adenovirus NOT DETECTED   Final   Influenza A H1 NOT DETECTED   Final   Influenza A H3 NOT DETECTED   Final   Comment: (NOTE)           Normal Reference Range for  each Analyte: NOT DETECTED     Testing performed using the Luminex xTAG Respiratory Viral Panel test     kit.     This test was developed and its performance characteristics determined     by [facility]. It has not been cleared or approved by the [place]     Food and Drug Administration. This test is used for clinical purposes.     It should not be regarded as investigational or for research. This     laboratory is certified under the Clinical Laboratory Improvement     Amendments of 1988 (CLIA) as qualified to perform high complexity     clinical laboratory testing.     Performed at [facility]  CULTURE, BLOOD (ROUTINE X 2)     Status: None   Collection Time    10/26/2013  8:13 PM      Result Value Ref Range Status   Specimen Description BLOOD LEFT FOREARM   Final   Special Requests BOTTLES DRAWN AEROBIC ONLY 3CC   Final   Culture  Setup Time     Final   Value: 10/16/2013 23:16     Performed at [facility]   Culture     Final   Value: NO GROWTH 5 DAYS     Performed at [facility]   Report Status 11/03/2013 FINAL   Final    Studies/Results: Dg Chest Port 1 View  11/04/2013   CLINICAL DATA:  Respiratory failure.  EXAM: PORTABLE CHEST - 1 VIEW  COMPARISON:  11/03/2013  FINDINGS: Endotracheal tube tip is approximately 2.5 cm above  the carina. Bilateral jugular central venous catheter shows stable positioning. Edema pattern improved with no significant residual pulmonary edema identified. Atelectasis/consolidation of both lower lungs is relatively stable, left greater than right. No significant pleural fluid. The heart size is within normal limits.  IMPRESSION: Decrease in pulmonary edema. Residual atelectasis/consolidation in both lower lungs, left greater than right.   Electronically Signed   By: Aletta Edouard M.D.   On: 11/04/2013 07:27   Dg Chest Port 1 View  11/03/2013   CLINICAL DATA:  Endotracheal tube replacement.  EXAM: PORTABLE CHEST - 1 VIEW   COMPARISON:  One-view chest 11/02/2013  FINDINGS: The endotracheal tube terminates 3.5 cm above the carina. Bilateral internal jugular catheters are stable. The heart is mildly enlarged. Mild interstitial edema is slightly increased. Bilateral pleural effusions are worse on the left. Associated airspace disease likely reflects atelectasis.  IMPRESSION: 1. Stable to slight increased diffuse edema. 2. The support apparatus is stable. 3. Bilateral pleural effusions, left greater than right with associated airspace disease. While this likely reflects atelectasis, infection is not excluded, particularly on the left.   Electronically Signed   By: Lawrence Santiago M.D.   On: 11/03/2013 07:56      Assessment/Plan:  Principal Problem:   Sepsis Active Problems:   Acute respiratory failure   Acute renal failure   Elevated LFTs   Acidosis   Thrombocytopenia   Seizures   Septic shock    Tracie [name] is a 68 y.o. female  presents with sepsis and Acute respiratory distress requiring intubation in the setting of pulmonary symptoms reports having history of tick bites in the last 7-10 days. Lab reveal thrombocytopenia, mildly elevated transaminitis, physical exam has lacy rash  worse on back. She has been treated with vancomycin, zosyn-->imipenem and doxycyline. Acute titers for RMSF, Ehrlichia. Borrelia were negative. CT abdomen and pelvis with oral contrast negative for infection.  She is doing MUCH, MUCH better with less pressor support   #1 Septic shock: with no clear cut cause  --Continue current antibiotics with vancomycin imipenem and doxycycline.  --Hopefully when she is extubated we can interview her more and try to ascertain if she has any symptoms that would guide [place] to any potential  unnoticed site of infection that we could pursue with imaging.     LOS: 7 days   Alcide Evener 11/04/2013, 2:52 PM

## 2013-11-04 NOTE — Progress Notes (Signed)
ANTIBIOTIC CONSULT NOTE - FOLLOW UP  Pharmacy Consult:  Vancomycin / Primaxin Indication: sepsis  Allergies  Allergen Reactions  . Sulfa Antibiotics Rash    Oral blisters    Patient Measurements: Height: 5\' 8"  (172.7 cm) Weight: 253 lb 4.9 oz (114.9 kg) IBW/kg (Calculated) : 63.9  Vital Signs: Temp: 96.9 F (36.1 C) (07/06 0700) Temp src: Core (Comment) (07/06 0700) BP: 87/40 mmHg (07/06 0734) Pulse Rate: 63 (07/06 0734) Intake/Output from previous day: 07/05 0701 - 07/06 0700 In: 4940.8 [I.V.:2200.8; NG/GT:1640; IV Piggyback:1100] Out: 7106 [Urine:10; Stool:100]  Labs:  Recent Labs  11/02/13 0400  11/03/13 0445 11/03/13 1600 11/04/13 0442  WBC 9.7  --  11.5*  --  8.2  HGB 10.0*  --  8.7*  --  7.5*  PLT 56*  --  66*  --  64*  CREATININE 1.49*  < > 1.48* 1.39* 1.37*  < > = values in this interval not displayed. Estimated Creatinine Clearance: 53.8 ml/min (by C-G formula based on Cr of 1.37). No results found for this basename: VANCOTROUGH, Corlis Leak, VANCORANDOM, [place], [place], GENTRANDOM, TOBRATROUGH, TOBRAPEAK, TOBRARND, AMIKACINPEAK, AMIKACINTROU, AMIKACIN,  in the last 72 hours   Microbiology: Recent Results (from the past 720 hour(s))  URINE CULTURE     Status: None   Collection Time    10/27/13 12:36 PM      Result Value Ref Range Status   Specimen Description URINE, CLEAN CATCH   Final   Special Requests NONE   Final   Culture  Setup Time     Final   Value: 10/09/2013 00:06     Performed at [place]     Final   Value: NO GROWTH     Performed at [facility]   Culture     Final   Value: NO GROWTH     Performed at [facility]   Report Status 10/29/2013 FINAL   Final  CULTURE, BLOOD (ROUTINE X 2)     Status: None   Collection Time    10/10/2013 12:00 PM      Result Value Ref Range Status   Specimen Description BLOOD RIGHT ANTECUBITAL   Final   Special Requests     Final   Value: BOTTLES DRAWN  AEROBIC AND ANAEROBIC AEB=12CC ANA=10CC   Culture  Setup Time     Final   Value: 10/30/2013 14:13     Performed at [facility]   Culture     Final   Value:        BLOOD CULTURE RECEIVED NO GROWTH TO DATE CULTURE WILL BE HELD FOR 5 DAYS BEFORE ISSUING A FINAL NEGATIVE REPORT     Performed at [facility]   Report Status PENDING   Incomplete  CULTURE, BLOOD (ROUTINE X 2)     Status: None   Collection Time    10/18/2013 12:25 PM      Result Value Ref Range Status   Specimen Description BLOOD LEFT HAND   Final   Special Requests BOTTLES DRAWN AEROBIC ONLY [place]   Final   Culture  Setup Time     Final   Value: 10/30/2013 14:14     Performed at [facility]   Culture     Final   Value:        BLOOD CULTURE RECEIVED NO GROWTH TO DATE CULTURE WILL BE HELD FOR 5 DAYS BEFORE ISSUING A FINAL NEGATIVE REPORT     Performed  at [facility]   Report Status PENDING   Incomplete  URINE CULTURE     Status: None   Collection Time    10/25/2013 12:36 PM      Result Value Ref Range Status   Specimen Description URINE, CATHETERIZED   Final   Special Requests NONE   Final   Culture  Setup Time     Final   Value: 10/18/2013 23:20     Performed at [place]     Final   Value: NO GROWTH     Performed at [facility]   Culture     Final   Value: NO GROWTH     Performed at [facility]   Report Status 10/29/2013 FINAL   Final  URINE CULTURE     Status: None   Collection Time    10/27/2013  6:19 PM      Result Value Ref Range Status   Specimen Description URINE, CATHETERIZED   Final   Special Requests Normal   Final   Culture  Setup Time     Final   Value: 10/27/2013 19:10     Performed at [place]     Final   Value: NO GROWTH     Performed at [facility]   Culture     Final   Value: NO GROWTH     Performed at [facility]   Report Status 10/29/2013 FINAL   Final  MRSA PCR SCREENING      Status: None   Collection Time    10/04/2013  6:19 PM      Result Value Ref Range Status   MRSA by PCR NEGATIVE  NEGATIVE Final   Comment:            The GeneXpert MRSA Assay (FDA     approved for NASAL specimens     only), is one component of a     comprehensive MRSA colonization     surveillance program. It is not     intended to diagnose MRSA     infection nor to guide or     monitor treatment for     MRSA infections.  CULTURE, BLOOD (ROUTINE X 2)     Status: None   Collection Time    10/19/2013  7:55 PM      Result Value Ref Range Status   Specimen Description BLOOD LEFT HAND   Final   Special Requests BOTTLES DRAWN AEROBIC ONLY 1CC   Final   Culture  Setup Time     Final   Value: 10/05/2013 23:16     Performed at [facility]   Culture     Final   Value: NO GROWTH 5 DAYS     Performed at [facility]   Report Status 11/03/2013 FINAL   Final  RESPIRATORY VIRUS PANEL     Status: Abnormal   Collection Time    10/23/2013  8:01 PM      Result Value Ref Range Status   Source - RVPAN NASAL SWAB   Corrected   Comment: CORRECTED ON 07/01 AT 1356: PREVIOUSLY REPORTED AS NASAL SWAB   Respiratory Syncytial Virus A NOT DETECTED   Final   Respiratory Syncytial Virus B NOT DETECTED   Final   Influenza A NOT DETECTED   Final   Influenza B NOT DETECTED   Final   Parainfluenza 1 NOT DETECTED  Final   Parainfluenza 2 NOT DETECTED   Final   Parainfluenza 3 NOT DETECTED   Final   Metapneumovirus NOT DETECTED   Final   Rhinovirus DETECTED (*)  Final   Adenovirus NOT DETECTED   Final   Influenza A H1 NOT DETECTED   Final   Influenza A H3 NOT DETECTED   Final   Comment: (NOTE)           Normal Reference Range for each Analyte: NOT DETECTED     Testing performed using the Luminex xTAG Respiratory Viral Panel test     kit.     This test was developed and its performance characteristics determined     by [facility]. It has not been cleared or approved by the [place]      Food and Drug Administration. This test is used for clinical purposes.     It should not be regarded as investigational or for research. This     laboratory is certified under the [place] (CLIA) as qualified to perform high complexity     clinical laboratory testing.     Performed at [place], BLOOD (ROUTINE X 2)     Status: None   Collection Time    10/20/2013  8:13 PM      Result Value Ref Range Status   Specimen Description BLOOD LEFT FOREARM   Final   Special Requests BOTTLES DRAWN AEROBIC ONLY 3CC   Final   Culture  Setup Time     Final   Value: 10/05/2013 23:16     Performed at [facility]   Culture     Final   Value: NO GROWTH 5 DAYS     Performed at [facility]   Report Status 11/03/2013 FINAL   Final      Assessment: 68 year old female on vancomycin, Primaxin, and doxycycline for sepsis.  She remains on CRRT with minimal interruptions.  Patient's vancomycin trough is slightly supra-therapeutic.  Vanc 6/29 >> Zosyn 6/29 >> 7/1 Doxy 6/30 >> Imipenem 7/1>>   7/6 VT = 22.8 mcg/mL on 1gm q24 (CRRT)  6/29 bldx2 - neg 6/29 urine - neg 6/29 viral - Rhinovirus+   Goal of Therapy:  Vancomycin trough level 15-20 mcg/ml   Plan:  - Decrease vanc to $Remov'750mg'BtYknI$  IV Q24H - Primaxin $RemoveBe'250mg'DCapQUizA$  IV Q6H - Doxy $Remo'100mg'jeiUa$  IV Q12H - Monitor CRRT plans and tolerance, clinical progress     Tevion Laforge D. Mina Marble, PharmD, BCPS Pager:  [phone] 11/04/2013, 12:05 PM

## 2013-11-05 ENCOUNTER — Inpatient Hospital Stay (HOSPITAL_COMMUNITY): Payer: Medicare HMO

## 2013-11-05 LAB — GLUCOSE, CAPILLARY
GLUCOSE-CAPILLARY: 107 mg/dL — AB (ref 70–99)
Glucose-Capillary: 107 mg/dL — ABNORMAL HIGH (ref 70–99)
Glucose-Capillary: 112 mg/dL — ABNORMAL HIGH (ref 70–99)
Glucose-Capillary: 127 mg/dL — ABNORMAL HIGH (ref 70–99)
Glucose-Capillary: 128 mg/dL — ABNORMAL HIGH (ref 70–99)

## 2013-11-05 LAB — RENAL FUNCTION PANEL
ALBUMIN: 2 g/dL — AB (ref 3.5–5.2)
ALBUMIN: 2 g/dL — AB (ref 3.5–5.2)
ANION GAP: 14 (ref 5–15)
Anion gap: 12 (ref 5–15)
BUN: 35 mg/dL — ABNORMAL HIGH (ref 6–23)
BUN: 33 mg/dL — AB (ref 6–23)
CALCIUM: 6.9 mg/dL — AB (ref 8.4–10.5)
CALCIUM: 6.9 mg/dL — AB (ref 8.4–10.5)
CO2: 24 mEq/L (ref 19–32)
CO2: 25 mEq/L (ref 19–32)
CREATININE: 1.36 mg/dL — AB (ref 0.50–1.10)
Chloride: 101 mEq/L (ref 96–112)
Chloride: 99 mEq/L (ref 96–112)
Creatinine, Ser: 1.48 mg/dL — ABNORMAL HIGH (ref 0.50–1.10)
GFR calc Af Amer: 46 mL/min — ABNORMAL LOW (ref >90)
GFR, EST AFRICAN AMERICAN: 41 mL/min — AB (ref >90)
GFR, EST NON AFRICAN AMERICAN: 36 mL/min — AB (ref >90)
GFR, EST NON AFRICAN AMERICAN: 40 mL/min — AB (ref >90)
Glucose, Bld: 110 mg/dL — ABNORMAL HIGH (ref 70–99)
Glucose, Bld: 100 mg/dL — ABNORMAL HIGH (ref 70–99)
PHOSPHORUS: 3.9 mg/dL (ref 2.3–4.6)
PHOSPHORUS: 4 mg/dL (ref 2.3–4.6)
Potassium: 4.4 mEq/L (ref 3.7–5.3)
Potassium: 4.6 mEq/L (ref 3.7–5.3)
SODIUM: 139 meq/L (ref 137–147)
Sodium: 136 mEq/L — ABNORMAL LOW (ref 137–147)

## 2013-11-05 LAB — MAGNESIUM: Magnesium: 2.4 mg/dL (ref 1.5–2.5)

## 2013-11-05 LAB — CULTURE, BLOOD (ROUTINE X 2)
Culture: NO GROWTH
Culture: NO GROWTH

## 2013-11-05 LAB — CBC
HEMATOCRIT: 22.5 % — AB (ref 36.0–46.0)
Hemoglobin: 7.4 g/dL — ABNORMAL LOW (ref 12.0–15.0)
MCH: 30.2 pg (ref 26.0–34.0)
MCHC: 32.9 g/dL (ref 30.0–36.0)
MCV: 91.8 fL (ref 78.0–100.0)
Platelets: 74 10*3/uL — ABNORMAL LOW (ref 150–400)
RBC: 2.45 MIL/uL — AB (ref 3.87–5.11)
RDW: 17.2 % — ABNORMAL HIGH (ref 11.5–15.5)
WBC: 4.8 10*3/uL (ref 4.0–10.5)

## 2013-11-05 LAB — CLOSTRIDIUM DIFFICILE BY PCR: Toxigenic C. Difficile by PCR: NEGATIVE

## 2013-11-05 MED ORDER — HYDROCORTISONE NA SUCCINATE PF 100 MG IJ SOLR
50.0000 mg | Freq: Two times a day (BID) | INTRAMUSCULAR | Status: DC
  Administered 2013-11-06 – 2013-11-07 (×3): 50 mg via INTRAVENOUS
  Filled 2013-11-05 (×5): qty 1

## 2013-11-05 MED ORDER — HEPARIN (PORCINE) IN NACL 100-0.45 UNIT/ML-% IJ SOLN
400.0000 [IU]/h | INTRAMUSCULAR | Status: DC
  Administered 2013-11-05: 400 [IU]/h via INTRAVENOUS
  Filled 2013-11-05 (×2): qty 250

## 2013-11-05 NOTE — Progress Notes (Signed)
PULMONARY / CRITICAL CARE MEDICINE   Name: Tracie [name] MRN: [id number] DOB: 02/13/1946    ADMISSION DATE:  10/01/2013  PRIMARY SERVICE: PCCM  CHIEF COMPLAINT:  SOB  BRIEF PATIENT DESCRIPTION: 68 y.o. F transferred from [facility] with septic shock, unclear source. Concern for tick borne illness, MODS.  SIGNIFICANT EVENTS / STUDIES:  6/28 presented to ED with SOB, treated w rocephin for UTI and sent home with levaquin for bronchitis. 6/29 adm to ICU/PCCM via ED in respiratory distress, hypotensive, lactic acidosis, fever to 104.   6/30 renal US: Negative for hydronephrosis.  Hepatic steatosis 6/30 Echocardiogram: Limited study quality but there appears to be normal biventricular size and systolic function. Impaired relaxation with normal filling pressures  6/30 BLE venous Dopplers: no DVT 7/01 Worsening MODS, shock, acidosis. CRRT initiated. Failed converntional vasopressors. Epinephrine gtt started 7/01 CTAP: negative  7/01 CT chest: mild left base atx 7/02 Reduced reqt for vasopressors. Off epinephrine 7/03 Remains on levo, vaso, arouses / follows commands  7/06 Extubated, remained on minimal levophed, goal negative fluid balance 7/07 Somnolent, off vasopressors. Tolerating extubation. Tolerating neg 100 cc/hr  LINES / TUBES: Foley 6/29 >> 7/6 ETT 6/29 >> 7/6 Femoral arterial cath 6/29 >> 7/6 R [place] CVL 6/29 >>  L IJ HD cath 7/01 >>   CULTURES: Blood 6/29 >>> negative Urine 6/29 >>> negative RVP 6/29  >>>rhinovirus positive  Tick-borne >> negative  ANTIBIOTICS: Zosyn 6/29 >> 7/1 Vancomycin 6/29 >> 7/07 Imipenem 7/1 >>  Doxy 6/29 >>   SUBJECTIVE:  Somnolent, off vasopressors.   VITAL SIGNS: Temp:  [96.2 F (35.7 C)-97.5 F (36.4 C)] 97.4 F (36.3 C) (07/07 1200) Pulse Rate:  [67-82] 73 (07/07 1400) Resp:  [11-26] 21 (07/07 1400) BP: (107-135)/(49-68) 123/62 mmHg (07/07 1400) SpO2:  [81 %-100 %] 98 % (07/07 1400) FiO2 (%):  [35 %-40 %] 40 % (07/07 0700) Weight:   [113.7 kg (250 lb 10.6 oz)] 113.7 kg (250 lb 10.6 oz) (07/07 0400)  HEMODYNAMICS:    VENTILATOR SETTINGS: Vent Mode:  [-]  FiO2 (%):  [35 %-40 %] 40 %  INTAKE / OUTPUT: Intake/Output     07/06 0701 - 07/07 0700 07/07 0701 - 07/08 0700   I.V. (mL/kg) 362.5 (3.2) 70 (0.6)   NG/GT 370    IV Piggyback 1025 550   Total Intake(mL/kg) 1757.5 (15.5) 620 (5.5)   Urine (mL/kg/hr) 37 (0)    Other 1767 (0.6) 1661 (1.6)   Stool 400 (0.1) 150 (0.1)   Total Output 2204 1811   Net -446.5 -1191         PHYSICAL EXAMINATION: General: NAD Neuro: no focal deficits noted, follows commands HEENT: WNL Cardiovascular: RRR, no M.  Lungs: clear anteriorly Abdomen: BS low, soft, NT/ND Ext: BUE>BLE pitting edema, multiple UE and LE digits with gangrenous tips   LABS: I have reviewed all new lab results and relevant findings are noted in A/P.  CXR: LLL ATX   ASSESSMENT / PLAN:  PULMONARY A: Acute Respiratory Failure, resolved Respiratory alkalosis, resolved H/O OSA with noncompliance with CPAP Possible PNA P:   Supp O2 Monitor in ICU  CARDIOVASCULAR A:  Septic Shock, resolved.  Suspected adrenal Insufficiency  P:  Wean stress dose steroids as tolerated   RENAL A:   Oligo-anuric AKI, CRRT initiated 7/01 AG metabolic acidosis, controlled Hypocalcemia, improved Volume overload P:   CRRT per Renal Monitor BMET intermittently Monitor I/Os Correct electrolytes as indicated Cont volume removal as permitted by BP  GASTROINTESTINAL A:  Nutrition Elevated LFTs P:   SUP: IV PPI NPO until cognition permits SLP eval  HEMATOLOGIC A:  DIC Thrombocytopenia, stable P:  -VTE Proph: on low dose heparin to prevent CRRT filter clotting, SCDs Monitor CBC intermittently Transfuse per usual ICU guidelines  INFECTIOUS A:   Septic Shock, resolved UTI, resolved RVP + for rhinovirus - unclear significance P:   Micro and abx as above  ENDOCRINE A:   Hyperglycemia,  controlled Risk of hyoglycemia (AKI, NPO) P:   DC SSI Cont CBGs Resume SSI for glu > 180   NEUROLOGIC A:   Hx of Seizures (previously on Lamictal) Acute encephalopathy Hypoactive delirium (on MIND [place] trial) P:   Minimize all sedatives Monitor for seizures  DERMATOLOGY A: Rash - resolved  Petechiae, resolved P: Monitor  GLOBAL: -Full Code  CCM X 40 mins  Billy Fischeravid Mackinzee Roszak, MD;  PCCM service; Mobile [phone]

## 2013-11-05 NOTE — Progress Notes (Signed)
Pt bladder scanned; 170 cc of urine noted; Dr Sung AmabileSimonds notified; order to repeat bladder scan in A.M.  Burnard BuntingKatrice Samanthajo Payano, RN

## 2013-11-05 NOTE — Progress Notes (Signed)
[place] for Infectious Disease  Vancomycin day 9 Imipenem day 7 Doxycycline day 9 Zosyn 3 days   Subjective: Extubated but not answering many questions   Antibiotics:  Anti-infectives   Start     Dose/Rate Route Frequency Ordered Stop   11/04/13 1500  vancomycin (VANCOCIN) IVPB 750 mg/150 ml premix  Status:  Discontinued     750 mg 150 mL/hr over 60 Minutes Intravenous Every 24 hours 11/04/13 1207 11/05/13 1017   11/01/13 1200  vancomycin (VANCOCIN) IVPB 1000 mg/200 mL premix  Status:  Discontinued     1,000 mg 200 mL/hr over 60 Minutes Intravenous Every 24 hours 11/01/13 0845 11/04/13 1206   11/01/13 1128  vancomycin (VANCOCIN) 1,500 mg in sodium chloride 0.9 % 500 mL IVPB  Status:  Discontinued     1,500 mg 250 mL/hr over 120 Minutes Intravenous Every 24 hours 10/31/13 1138 11/01/13 0845   10/31/13 1000  vancomycin (VANCOCIN) 1,500 mg in sodium chloride 0.9 % 500 mL IVPB  Status:  Discontinued     1,500 mg 250 mL/hr over 120 Minutes Intravenous Every 48 hours 10/30/13 0802 10/31/13 1138   10/30/13 1400  imipenem-cilastatin (PRIMAXIN) 250 mg in sodium chloride 0.9 % 100 mL IVPB     250 mg 200 mL/hr over 30 Minutes Intravenous Every 6 hours 10/30/13 1108     10/29/13 1100  levofloxacin (LEVAQUIN) IVPB 750 mg  Status:  Discontinued     750 mg 100 mL/hr over 90 Minutes Intravenous Every 48 hours 10/29/13 1000 10/29/13 1002   10/29/13 1100  doxycycline (VIBRAMYCIN) 100 mg in dextrose 5 % 250 mL IVPB     100 mg 125 mL/hr over 120 Minutes Intravenous Every 12 hours 10/29/13 1002     10/29/13 1000  vancomycin (VANCOCIN) IVPB 750 mg/150 ml premix  Status:  Discontinued     750 mg 150 mL/hr over 60 Minutes Intravenous Every 12 hours 10/29/2013 1851 10/30/13 0802   10/15/2013 2200  piperacillin-tazobactam (ZOSYN) IVPB 3.375 g  Status:  Discontinued     3.375 g 12.5 mL/hr over 240 Minutes Intravenous 3 times per day 10/16/2013 1851 10/30/13 1031   10/08/2013 1900  vancomycin  (VANCOCIN) IVPB 1000 mg/200 mL premix     1,000 mg 200 mL/hr over 60 Minutes Intravenous NOW 10/01/2013 1851 10/17/2013 2120   10/25/2013 1400  vancomycin (VANCOCIN) IVPB 1000 mg/200 mL premix     1,000 mg 200 mL/hr over 60 Minutes Intravenous  Once 10/16/2013 1346 10/21/2013 1626   10/29/2013 1345  piperacillin-tazobactam (ZOSYN) IVPB 3.375 g     3.375 g 100 mL/hr over 30 Minutes Intravenous  Once 10/10/2013 1346 10/23/2013 1501      Medications: Scheduled Meds: . antiseptic oral rinse  15 mL Mouth Rinse QID  . chlorhexidine  15 mL Mouth Rinse BID  . doxycycline (VIBRAMYCIN) IV  100 mg Intravenous Q12H  . hydrocortisone sod succinate (SOLU-CORTEF) inj  50 mg Intravenous Q6H  . imipenem-cilastatin  250 mg Intravenous Q6H  . insulin aspart  0-15 Units Subcutaneous 6 times per day  . levETIRAcetam  500 mg Intravenous Q12H  . research study medication  0.25-2 mL Intravenous Q12H  . pantoprazole (PROTONIX) IV  40 mg Intravenous QHS   Continuous Infusions: . sodium chloride 10 mL/hr at 11/04/13 1015  . heparin 400 Units/hr (11/05/13 0950)  . norepinephrine (LEVOPHED) Adult infusion Stopped (11/04/13 1300)  . dialysis replacement fluid (prismasate) 500 mL/hr at 11/05/13 0829  . dialysis replacement fluid (prismasate) 400  mL/hr at 11/05/13 0821  . dialysate (PRISMASATE) 1,500 mL/hr at 11/05/13 0823   PRN Meds:.fentaNYL, heparin, sodium chloride    Objective: Weight change: -2 lb 10.3 oz (-1.2 kg)  Intake/Output Summary (Last 24 hours) at 11/05/13 1153 Last data filed at 11/05/13 1100  Gross per 24 hour  Intake 1423.7 ml  Output   2864 ml  Net -1440.3 ml   Blood pressure 125/59, pulse 77, temperature 97.5 F (36.4 C), temperature source Axillary, resp. rate 26, height _0  (1.727 m), weight 250 lb 10.6 oz (113.7 kg), SpO2 100.00%. Temp:  [96.2 F (35.7 C)-97.7 F (36.5 C)] 97.5 F (36.4 C) (07/07 0800) Pulse Rate:  [67-92] 77 (07/07 1100) Resp:  [11-26] 26 (07/07 1100) BP:  (107-140)/(49-82) 125/59 mmHg (07/07 1100) SpO2:  [81 %-100 %] 100 % (07/07 1100) Arterial Line BP: (105-133)/(49-62) 133/62 mmHg (07/06 1530) FiO2 (%):  [35 %-40 %] 40 % (07/07 0700) Weight:  [250 lb 10.6 oz (113.7 kg)] 250 lb 10.6 oz (113.7 kg) (07/07 0400)  Physical Exam: General: Sedated but not answer able to answer many questions , getting CVVHD HEENT: , EOMI CVS tachycardic normal r,  no murmur rubs or gallops Chest:  rhonchi Abdomen: soft slightly distended not clear if she is tendernormal bowel sounds, Extremities: no  clubbing or edema noted bilaterally Skin: did not get good look at rash today Neuro: nonfocal Rectal tube with liquid stool in bag CBC:  Recent Labs Lab 10/30/13 0803 10/31/13 0400 11/01/13 0500 11/02/13 0400 11/03/13 0445 11/04/13 0442 11/05/13 0503  HGB  --  11.5* 10.8* 10.0* 8.7* 7.5* 7.4*  HCT  --  32.2* 31.6* 29.2* 25.6* 22.3* 22.5*  PLT  --  42* 45* 56* 66* 64* 74*  INR 1.92* 3.22* 1.45  --   --   --   --   APTT 49* >200* 37  --   --   --   --      BMET  Recent Labs  11/04/13 1534 11/05/13 0503  NA 137 139  K 4.5 4.4  CL 102 101  CO2 22 24  GLUCOSE 134* 110*  BUN 38* 35*  CREATININE 1.65* 1.48*  CALCIUM 6.9* 6.9*     Liver Panel   Recent Labs  11/04/13 1534 11/05/13 0503  ALBUMIN 2.0* 2.0*       Sedimentation Rate No results found for this basename: ESRSEDRATE,  in the last 72 hours C-Reactive Protein No results found for this basename: CRP,  in the last 72 hours  Micro Results: Recent Results (from the past 240 hour(s))  URINE CULTURE     Status: None   Collection Time    10/27/13 12:36 PM      Result Value Ref Range Status   Specimen Description URINE, CLEAN CATCH   Final   Special Requests NONE   Final   Culture  Setup Time     Final   Value: 10/21/2013 00:06     Performed at [facility]     Final   Value: NO GROWTH     Performed at [facility]   Culture     Final    Value: NO GROWTH     Performed at [facility]   Report Status 10/29/2013 FINAL   Final  CULTURE, BLOOD (ROUTINE X 2)     Status: None   Collection Time    09/30/2013 12:00 PM      Result Value Ref Range  Status   Specimen Description BLOOD RIGHT ANTECUBITAL   Final   Special Requests     Final   Value: BOTTLES DRAWN AEROBIC AND ANAEROBIC AEB=12CC ANA=10CC   Culture  Setup Time     Final   Value: 10/30/2013 14:13     Performed at [facility]   Culture     Final   Value: NO GROWTH 5 DAYS     Performed at [facility]   Report Status 11/05/2013 FINAL   Final  CULTURE, BLOOD (ROUTINE X 2)     Status: None   Collection Time    10/23/2013 12:25 PM      Result Value Ref Range Status   Specimen Description BLOOD LEFT HAND   Final   Special Requests BOTTLES DRAWN AEROBIC ONLY [facility]   Final   Culture  Setup Time     Final   Value: 10/30/2013 14:14     Performed at [facility]   Culture     Final   Value: NO GROWTH 5 DAYS     Performed at [facility]   Report Status 11/05/2013 FINAL   Final  URINE CULTURE     Status: None   Collection Time    10/03/2013 12:36 PM      Result Value Ref Range Status   Specimen Description URINE, CATHETERIZED   Final   Special Requests NONE   Final   Culture  Setup Time     Final   Value: 10/29/2013 23:20     Performed at [facility]     Final   Value: NO GROWTH     Performed at [facility]   Culture     Final   Value: NO GROWTH     Performed at [facility]   Report Status 10/29/2013 FINAL   Final  URINE CULTURE     Status: None   Collection Time    10/07/2013  6:19 PM      Result Value Ref Range Status   Specimen Description URINE, CATHETERIZED   Final   Special Requests Normal   Final   Culture  Setup Time     Final   Value: 10/26/2013 19:10     Performed at [place]     Final   Value: NO GROWTH     Performed at [facility]    Culture     Final   Value: NO GROWTH     Performed at [facility]   Report Status 10/29/2013 FINAL   Final  MRSA PCR SCREENING     Status: None   Collection Time    10/04/2013  6:19 PM      Result Value Ref Range Status   MRSA by PCR NEGATIVE  NEGATIVE Final   Comment:            The GeneXpert MRSA Assay (FDA     approved for NASAL specimens     only), is one component of a     comprehensive MRSA colonization     surveillance program. It is not     intended to diagnose MRSA     infection nor to guide or     monitor treatment for     MRSA infections.  CULTURE, BLOOD (ROUTINE X 2)     Status: None   Collection Time    10/08/2013  7:55 PM  Result Value Ref Range Status   Specimen Description BLOOD LEFT HAND   Final   Special Requests BOTTLES DRAWN AEROBIC ONLY 1CC   Final   Culture  Setup Time     Final   Value: 10/07/2013 23:16     Performed at [facility]   Culture     Final   Value: NO GROWTH 5 DAYS     Performed at [facility]   Report Status 11/03/2013 FINAL   Final  RESPIRATORY VIRUS PANEL     Status: Abnormal   Collection Time    10/22/2013  8:01 PM      Result Value Ref Range Status   Source - RVPAN NASAL SWAB   Corrected   Comment: CORRECTED ON 07/01 AT 1356: PREVIOUSLY REPORTED AS NASAL SWAB   Respiratory Syncytial Virus A NOT DETECTED   Final   Respiratory Syncytial Virus B NOT DETECTED   Final   Influenza A NOT DETECTED   Final   Influenza B NOT DETECTED   Final   Parainfluenza 1 NOT DETECTED   Final   Parainfluenza 2 NOT DETECTED   Final   Parainfluenza 3 NOT DETECTED   Final   Metapneumovirus NOT DETECTED   Final   Rhinovirus DETECTED (*)  Final   Adenovirus NOT DETECTED   Final   Influenza A H1 NOT DETECTED   Final   Influenza A H3 NOT DETECTED   Final   Comment: (NOTE)           Normal Reference Range for each Analyte: NOT DETECTED     Testing performed using the Luminex xTAG Respiratory Viral Panel test     kit.     This  test was developed and its performance characteristics determined     by [facility]. It has not been cleared or approved by the [place]     Food and Drug Administration. This test is used for clinical purposes.     It should not be regarded as investigational or for research. This     laboratory is certified under the [place] (CLIA) as qualified to perform high complexity     clinical laboratory testing.     Performed at [place], BLOOD (ROUTINE X 2)     Status: None   Collection Time    10/14/2013  8:13 PM      Result Value Ref Range Status   Specimen Description BLOOD LEFT FOREARM   Final   Special Requests BOTTLES DRAWN AEROBIC ONLY 3CC   Final   Culture  Setup Time     Final   Value: 10/04/2013 23:16     Performed at [facility]   Culture     Final   Value: NO GROWTH 5 DAYS     Performed at [facility]   Report Status 11/03/2013 FINAL   Final    Studies/Results: Dg Chest Port 1 View  11/05/2013   CLINICAL DATA:  Respiratory failure  EXAM: PORTABLE CHEST - 1 VIEW  COMPARISON:  Portable chest x-ray of November 04, 2013  FINDINGS: The lungs are better inflated today. The interstitial markings remain coarse. There is no alveolar pneumonia. The cardiac silhouette is top-normal in size but better defined today. The pulmonary vascularity is less engorged.  The right internal jugular catheter tip lies in the proximal SVC. The dual-lumen left internal jugular dialysis type catheter tip lies in  the midportion of the SVC. There has been interval removal of the endotracheal tube and esophagogastric tube.  IMPRESSION: There has been interval improvement in the appearance of the pulmonary interstitium consistent with resolving interstitial edema.   Electronically Signed   By: David  Martinique   On: 11/05/2013 07:14   Dg Chest Port 1 View  11/04/2013   CLINICAL DATA:  Respiratory failure.  EXAM: PORTABLE CHEST - 1 VIEW   COMPARISON:  11/03/2013  FINDINGS: Endotracheal tube tip is approximately 2.5 cm above the carina. Bilateral jugular central venous catheter shows stable positioning. Edema pattern improved with no significant residual pulmonary edema identified. Atelectasis/consolidation of both lower lungs is relatively stable, left greater than right. No significant pleural fluid. The heart size is within normal limits.  IMPRESSION: Decrease in pulmonary edema. Residual atelectasis/consolidation in both lower lungs, left greater than right.   Electronically Signed   By: Aletta Edouard M.D.   On: 11/04/2013 07:27      Assessment/Plan:  Principal Problem:   Sepsis Active Problems:   Acute respiratory failure   Acute renal failure   Elevated LFTs   Acidosis   Thrombocytopenia   Seizures   Septic shock    Tracie [name] is a 69 y.o. female  presents with sepsis and Acute respiratory distress requiring intubation in the setting of pulmonary symptoms reports having history of tick bites in the last 7-10 days. Lab reveal thrombocytopenia, mildly elevated transaminitis, physical exam has lacy rash  worse on back. She has been treated with vancomycin, zosyn-->imipenem and doxycyline. Acute titers for RMSF, Ehrlichia. Borrelia were negative. CT abdomen and pelvis with oral contrast negative for infection.  She is doing MUCH, MUCH better with less pressor support   #1 Septic shock: with no clear cut cause  --will dc her vancomycin today and continue imipenem and doxycycline for now ---will check C. difficile PCR see below  --Hopefully w as she is now extubated he will continue to improve and we can interview her more and try to ascertain if she has any symptoms that would guide [place] to any potential  unnoticed site of infection that we could pursue with imaging.  #2 loose stool with a rectal tube: Given broad-spectrum antibiotic exposure C. difficile colitis is of concern arise she's been on tube feeds and her  other reason she could have diarrhea including antibiotics themselves causing it but we need to look for C. difficile   LOS: 8 days   Alcide Evener 11/05/2013, 11:53 AM

## 2013-11-05 NOTE — Progress Notes (Signed)
Patient ID: Tracie [name], female   DOB: 06/19/1945, 68 y.o.   MRN: [id number]   [facility] Progress Note    Assessment/ Plan:   1. AKI in setting of SIRS: remains anuric and now off pressors. No evident renal recovery. Continue CVVH for now and plan for transition to IHD in the next 24-48hrs. UF goal tolerated at 5950mL/hr--will increase to 17300mL/hr. Start heparin gtt 400units/hrs for extracorporeal system anticoagulation. Metabolic acidosis and potassium abnormalities corrected at this time on CVVH.  2. Hypocalcemia- was given IV Calcium gluconate gtt with improved Ca (corrected 8.5)  3. SIRS- etiology remains unclear-suspected bacterial etiology probably associated with tick bite (serologies so far negative)-empirically on doxy, imipenem, and vanco   4. VDRF-extubated yesterday 5. Thrombocytopenia: baseline platelet count not known-suspected to be associated with sepsis versus tick bite 6. Abnormal LFT's- ?shock liver vs. Ehrlichiosis/rickettsial disease 7. Hyperglycemia/DM- per PCCM  Subjective:   Extubated yesterday and limited pressors overnight. Clotted CVVH system 3 times in 24hrs   Objective:   BP 115/50  Pulse 78  Temp(Src) 96.2 F (35.7 C) (Oral)  Resp 21  Ht 5\' 8"  (1.727 m)  Wt 113.7 kg (250 lb 10.6 oz)  BMI 38.12 kg/m2  SpO2 99%  Intake/Output Summary (Last 24 hours) at 11/05/13 [id number] Last data filed at 11/05/13 0700  Gross per 24 hour  Intake 1753.5 ml  Output   2204 ml  Net -450.5 ml   Weight change: -1.2 kg (-2 lb 10.3 oz)  Physical Exam: JXB:[id number]:Somnolent on NRB mask ZHY:[id number]:Pulse RRR, normal S1 and S2 Resp:Coarse bilaterally, no rales/rhonchi ION:GEXBAbd:Soft, obese, NT, BS normal Ext:2+ LE edema  Imaging: Dg Chest Port 1 View  11/04/2013   CLINICAL DATA:  Respiratory failure.  EXAM: PORTABLE CHEST - 1 VIEW  COMPARISON:  11/03/2013  FINDINGS: Endotracheal tube tip is approximately 2.5 cm above the carina. Bilateral jugular central venous catheter shows  stable positioning. Edema pattern improved with no significant residual pulmonary edema identified. Atelectasis/consolidation of both lower lungs is relatively stable, left greater than right. No significant pleural fluid. The heart size is within normal limits.  IMPRESSION: Decrease in pulmonary edema. Residual atelectasis/consolidation in both lower lungs, left greater than right.   Electronically Signed   By: Irish LackGlenn  Yamagata M.D.   On: 11/04/2013 07:27    Labs: BMET  Recent Labs Lab 11/02/13 0400 11/02/13 1600 11/03/13 0445 11/03/13 1600 11/04/13 0442 11/04/13 1534 11/05/13 0503  NA 134* 131* 133* 133* 135* 137 139  K 3.6* 4.3 4.3 4.3 4.2 4.5 4.4  CL 95* 96 97 97 100 102 101  CO2 26 22 24 23 23 22 24   GLUCOSE 168* 203* 182* 187* 170* 134* 110*  BUN 18 17 19 22  28* 38* 35*  CREATININE 1.49* 1.50* 1.48* 1.39* 1.37* 1.65* 1.48*  CALCIUM 7.4* 8.1* 7.5* 7.2* 6.8* 6.9* 6.9*  PHOS 1.5* 3.0 2.9 2.7 3.0 3.6 3.9   CBC  Recent Labs Lab 10/29/13 1110 10/30/13 0355 10/31/13 0400 11/01/13 0500 11/02/13 0400 11/03/13 0445 11/04/13 0442 11/05/13 0503  WBC 7.8 13.9* 11.5* 10.4 9.7 11.5* 8.2 4.8  NEUTROABS 6.9 11.7* 7.8* 6.3  --   --   --   --   HGB 12.6 13.2 11.5* 10.8* 10.0* 8.7* 7.5* 7.4*  HCT 37.4 40.4 32.2* 31.6* 29.2* 25.6* 22.3* 22.5*  MCV 87.8 88.8 84.7 84.5 86.1 85.3 87.8 91.8  PLT 42* 70* 42* 45* 56* 66* 64* 74*   Medications:    . antiseptic oral rinse  15 mL Mouth Rinse QID  . chlorhexidine  15 mL Mouth Rinse BID  . doxycycline (VIBRAMYCIN) IV  100 mg Intravenous Q12H  . hydrocortisone sod succinate (SOLU-CORTEF) inj  50 mg Intravenous Q6H  . imipenem-cilastatin  250 mg Intravenous Q6H  . insulin aspart  0-15 Units Subcutaneous 6 times per day  . levETIRAcetam  500 mg Intravenous Q12H  . research study medication  0.25-2 mL Intravenous Q12H  . pantoprazole (PROTONIX) IV  40 mg Intravenous QHS  . vancomycin  750 mg Intravenous Q24H   Zetta BillsJay Alvon Nygaard, MD 11/05/2013, 7:12  AM

## 2013-11-05 NOTE — Evaluation (Signed)
Clinical/Bedside Swallow Evaluation Patient Details  Name: Tracie JasperBrenda B Bayles MRN: [id number] Date of Birth: 12/31/1945  Today's Date: 11/05/2013 Time: 0800-0815 SLP Time Calculation (min): 15 min  Past Medical History:  Past Medical History  Diagnosis Date  . Sleep apnea     somewhat compliant with CPAP  . CVA (cerebral infarction) 1970  . Seizures   . Gout   . Hyperlipidemia   . Vertigo    Past Surgical History:  Past Surgical History  Procedure Laterality Date  . Cholecystectomy    . Knee arthroscopy      Right   HPI:  68 y.o. F transferred from [facility] with septic shock, unclear source. Concern for tick borne illness, Multiple organ dysfunction syndrome. Intubated from 6/29 to 7/6.    Assessment / Plan / Recommendation Clinical Impression  Pt awake, but not fully alert for POs. With max positioning, tactile and verbal cues pt did not gain sufficient awareness of ice chips and POs to lips and tongue to progress in assessment. Pt should remain NPO, SLP will f/u tomorrow to reassess readiness.     Aspiration Risk  Severe    Diet Recommendation NPO;Alternative means - temporary   Medication Administration: Via alternative means    Other  Recommendations Oral Care Recommendations: Oral care Q4 per protocol   Follow Up Recommendations       Frequency and Duration min 2x/week  2 weeks   Pertinent Vitals/Pain NA    SLP Swallow Goals     Swallow Study Prior Functional Status       General HPI: 68 y.o. F transferred from [facility] with septic shock, unclear source. Concern for tick borne illness, Multiple organ dysfunction syndrome. Intubated from 6/29 to 7/6.  Type of Study: Bedside swallow evaluation Previous Swallow Assessment: none Diet Prior to this Study: NPO Temperature Spikes Noted: No Respiratory Status: Nasal cannula History of Recent Intubation: Yes Length of Intubations (days): 8 days Date extubated: 11/04/13 Behavior/Cognition: Lethargic;Confused Oral  Cavity - Dentition: Adequate natural dentition Self-Feeding Abilities: Total assist Patient Positioning: Upright in bed Baseline Vocal Quality: Low vocal intensity Volitional Cough: Weak Volitional Swallow: Unable to elicit    Oral/Motor/Sensory Function Overall Oral Motor/Sensory Function: Impaired at baseline Labial ROM: Other (Comment) (WFL with smile, involuntary mvmt - left sided weakness) Labial Symmetry: Within Functional Limits Labial Strength: Reduced Lingual Symmetry: Within Functional Limits Lingual Strength: Reduced   Ice Chips Ice chips: Impaired Presentation: Spoon Oral Phase Impairments: Poor awareness of bolus;Impaired anterior to posterior transit;Reduced lingual movement/coordination   Thin Liquid Thin Liquid: Not tested    Nectar Thick Nectar Thick Liquid: Not tested   Honey Thick Honey Thick Liquid: Not tested   Puree Puree: Impaired Presentation: Spoon Oral Phase Impairments: Reduced labial seal;Poor awareness of bolus   Solid   GO    Solid: Not tested      Harlon DittyBonnie Patrik Turnbaugh, MA CCC-SLP [phone]  Claudine MoutonDeBlois, Arnaldo Heffron Caroline 11/05/2013,9:43 AM

## 2013-11-06 ENCOUNTER — Inpatient Hospital Stay (HOSPITAL_COMMUNITY): Payer: Medicare HMO

## 2013-11-06 LAB — POCT ACTIVATED CLOTTING TIME
ACTIVATED CLOTTING TIME: 152 s
ACTIVATED CLOTTING TIME: 152 s
ACTIVATED CLOTTING TIME: 152 s
ACTIVATED CLOTTING TIME: 157 s
Activated Clotting Time: 101 seconds
Activated Clotting Time: 112 seconds
Activated Clotting Time: 118 seconds
Activated Clotting Time: 135 seconds
Activated Clotting Time: 163 seconds
Activated Clotting Time: 168 seconds
Activated Clotting Time: 174 seconds
Activated Clotting Time: 180 seconds
Activated Clotting Time: 180 seconds

## 2013-11-06 LAB — MAGNESIUM: Magnesium: 2.5 mg/dL (ref 1.5–2.5)

## 2013-11-06 LAB — RENAL FUNCTION PANEL
Albumin: 2.2 g/dL — ABNORMAL LOW (ref 3.5–5.2)
Albumin: 2.3 g/dL — ABNORMAL LOW (ref 3.5–5.2)
Anion gap: 14 (ref 5–15)
Anion gap: 14 (ref 5–15)
BUN: 33 mg/dL — AB (ref 6–23)
BUN: 30 mg/dL — AB (ref 6–23)
CALCIUM: 7 mg/dL — AB (ref 8.4–10.5)
CHLORIDE: 101 meq/L (ref 96–112)
CO2: 23 meq/L (ref 19–32)
CO2: 24 meq/L (ref 19–32)
Calcium: 7.2 mg/dL — ABNORMAL LOW (ref 8.4–10.5)
Chloride: 100 mEq/L (ref 96–112)
Creatinine, Ser: 1.37 mg/dL — ABNORMAL HIGH (ref 0.50–1.10)
Creatinine, Ser: 1.28 mg/dL — ABNORMAL HIGH (ref 0.50–1.10)
GFR calc Af Amer: 45 mL/min — ABNORMAL LOW (ref >90)
GFR calc non Af Amer: 43 mL/min — ABNORMAL LOW (ref >90)
GFR, EST AFRICAN AMERICAN: 49 mL/min — AB (ref >90)
GFR, EST NON AFRICAN AMERICAN: 39 mL/min — AB (ref >90)
GLUCOSE: 103 mg/dL — AB (ref 70–99)
Glucose, Bld: 99 mg/dL (ref 70–99)
POTASSIUM: 4.4 meq/L (ref 3.7–5.3)
Phosphorus: 4 mg/dL (ref 2.3–4.6)
Phosphorus: 3.5 mg/dL (ref 2.3–4.6)
Potassium: 4.4 mEq/L (ref 3.7–5.3)
Sodium: 138 mEq/L (ref 137–147)
Sodium: 138 mEq/L (ref 137–147)

## 2013-11-06 LAB — CBC
HEMATOCRIT: 24.6 % — AB (ref 36.0–46.0)
Hemoglobin: 8.1 g/dL — ABNORMAL LOW (ref 12.0–15.0)
MCH: 30.5 pg (ref 26.0–34.0)
MCHC: 32.9 g/dL (ref 30.0–36.0)
MCV: 92.5 fL (ref 78.0–100.0)
Platelets: 91 10*3/uL — ABNORMAL LOW (ref 150–400)
RBC: 2.66 MIL/uL — ABNORMAL LOW (ref 3.87–5.11)
RDW: 18.5 % — AB (ref 11.5–15.5)
WBC: 4.3 10*3/uL (ref 4.0–10.5)

## 2013-11-06 LAB — GLUCOSE, CAPILLARY
GLUCOSE-CAPILLARY: 103 mg/dL — AB (ref 70–99)
GLUCOSE-CAPILLARY: 97 mg/dL (ref 70–99)
Glucose-Capillary: 102 mg/dL — ABNORMAL HIGH (ref 70–99)
Glucose-Capillary: 99 mg/dL (ref 70–99)

## 2013-11-06 MED ORDER — SODIUM CHLORIDE 0.9 % IJ SOLN
250.0000 [IU]/h | INTRAMUSCULAR | Status: DC
  Administered 2013-11-06: 250 [IU]/h via INTRAVENOUS_CENTRAL
  Administered 2013-11-06: 1900 [IU]/h via INTRAVENOUS_CENTRAL
  Administered 2013-11-06: 1450 [IU]/h via INTRAVENOUS_CENTRAL
  Administered 2013-11-07: 2000 [IU]/h via INTRAVENOUS_CENTRAL
  Administered 2013-11-07: 2050 [IU]/h via INTRAVENOUS_CENTRAL
  Administered 2013-11-07 (×2): 2000 [IU]/h via INTRAVENOUS_CENTRAL
  Filled 2013-11-06 (×10): qty 2

## 2013-11-06 MED ORDER — HEPARIN BOLUS VIA INFUSION (CRRT)
1000.0000 [IU] | INTRAVENOUS | Status: DC | PRN
Start: 2013-11-06 — End: 2013-11-08
  Administered 2013-11-06 (×3): 1000 [IU] via INTRAVENOUS_CENTRAL
  Filled 2013-11-06: qty 1000

## 2013-11-06 NOTE — Progress Notes (Signed)
PULMONARY / CRITICAL CARE MEDICINE   Name: Tracie [name] MRN: [id number] DOB: 04/03/1946    ADMISSION DATE:  December 15, 2013  PRIMARY SERVICE: PCCM  CHIEF COMPLAINT:  SOB  BRIEF PATIENT DESCRIPTION: [age] y.o. F transferred from [facility] with septic shock, unclear source. Concern for tick borne illness, MODS.  SIGNIFICANT EVENTS / STUDIES:  6/28 presented to ED with SOB, treated w rocephin for UTI and sent home with levaquin for bronchitis. 6/29 adm to ICU/PCCM via ED in respiratory distress, hypotensive, lactic acidosis, fever to 104.   6/30 renal US: Negative for hydronephrosis.  Hepatic steatosis 6/30 Echocardiogram: Limited study quality but there appears to be normal biventricular size and systolic function. Impaired relaxation with normal filling pressures  6/30 BLE venous Dopplers: no DVT 7/01 Worsening MODS, shock, acidosis. CRRT initiated. Failed converntional vasopressors. Epinephrine gtt started 7/01 CTAP: negative  7/01 CT chest: mild left base atx 7/02 Reduced reqt for vasopressors. Off epinephrine 7/03 Remains on levo, vaso, arouses / follows commands  7/06 Extubated, remained on minimal levophed, goal negative fluid balance 7/07 Somnolent, off vasopressors. Tolerating extubation. Tolerating neg 100 cc/hr  LINES / TUBES: Foley 6/29 >> 7/6 ETT 6/29 >> 7/6 Femoral arterial cath 6/29 >> 7/6 R [place] CVL 6/29 >>  L IJ HD cath 7/01 >>   CULTURES: Blood 6/29 >>> negative Urine 6/29 >>> negative RVP 6/29  >>>rhinovirus positive  Tick-borne pathogens >> negative C diff 7/07 >> NEG  ANTIBIOTICS: Zosyn 6/29 >> 7/1 Vancomycin 6/29 >> 7/07 Imipenem 7/1 >>  Doxy 6/29 >>   SUBJECTIVE:  Somnolence - improved. Remains off vasopressors. Marginal handling of UA secretions  VITAL SIGNS: Temp:  [94 F (34.4 C)-97.6 F (36.4 C)] 94 F (34.4 C) (07/08 1200) Pulse Rate:  [68-86] 73 (07/08 1400) Resp:  [14-24] 24 (07/08 1400) BP: (59-139)/(45-85) 111/63 mmHg (07/08 1400) SpO2:  [94  %-100 %] 98 % (07/08 1400) Weight:  [111.5 kg (245 lb 13 oz)] 111.5 kg (245 lb 13 oz) (07/08 0500)  HEMODYNAMICS:    VENTILATOR SETTINGS:    INTAKE / OUTPUT: Intake/Output     07/07 0701 - 07/08 0700 07/08 0701 - 07/09 0700   I.V. (mL/kg) 254 (2.3) 39 (0.3)   NG/GT     IV Piggyback 1100 550   Total Intake(mL/kg) 1354 (12.1) 589 (5.3)   Urine (mL/kg/hr)  208 (0.2)   Other 4036 (1.5) 1342 (1.6)   Stool 175 (0.1)    Total Output 4211 1550   Net -2857 -961         PHYSICAL EXAMINATION: General: NAD Neuro: no focal deficits noted, diffusely weak HEENT: WNL Cardiovascular: RRR, no M.  Lungs: clear anteriorly Abdomen: BS low, soft, NT/ND Ext: BUE>BLE pitting edema, multiple UE and LE digits with gangrenous tips   LABS: I have reviewed all new lab results and relevant findings are noted in A/P.  CXR: NSC   ASSESSMENT / PLAN:  PULMONARY A: Acute Respiratory Failure, resolved Respiratory alkalosis, resolved H/O OSA with noncompliance with CPAP P:   Cont supp O2 Cont to monitor in ICU Work on airway hygiene  CARDIOVASCULAR A:  Septic Shock, resolved.  Suspected adrenal Insufficiency  P:  Wean stress dose steroids as tolerated  Monitor  RENAL A:   Oligo-anuric AKI, CRRT initiated 7/01 AG metabolic acidosis, controlled Hypocalcemia, improving Volume overload, tolerating volume removal @ 100 cc/hr P:   Cont CRRT per Renal Cont volume removal as permitted by BP Monitor BMET intermittently Monitor I/Os Correct electrolytes as indicated  GASTROINTESTINAL A:   Elevated LFTs P:   SUP: N/I post extubation Cont NPO until cognition permits SLP eval  HEMATOLOGIC A:  DIC Thrombocytopenia, improving P:  VTE Proph: SCDs Monitor CBC intermittently Transfuse per usual ICU guidelines  INFECTIOUS A:   Septic Shock, resolved Suspected tick borne illness UTI, resolved RVP + for rhinovirus - unclear significance LLL infiltrate, possible PNA P:   Micro  and abx as above ID managing abx  ENDOCRINE A:   Hyperglycemia, controlled Risk of hyoglycemia (AKI, NPO) P:   Cont CBGs Resume SSI for glu > 180   NEUROLOGIC A:   Hx of Seizures (previously on Lamictal) Acute encephalopathy Hypoactive delirium (on MIND [place] trial) P:   Minimize all sedatives Monitor for seizures  DERMATOLOGY A: Rash - resolved  Petechiae, resolved P: Monitor  GLOBAL: -Full Code  CCM X 35 mins  Billy Fischeravid Tris Howell, MD;  PCCM service; Mobile [phone]

## 2013-11-06 NOTE — Progress Notes (Signed)
SLP Cancellation Note  Patient Details Name: Tracie [name] MRN: [id number] DOB: 04/21/1946   Cancelled treatment:       Reason Eval/Treat Not Completed: Medical issues which prohibited therapy. Checked in with pt and RN this am. RN giving NTS suction, says pt not ready for POs today. Will f/u tomorrow.    Mahir Prabhakar, Riley NearingBonnie Caroline 11/06/2013, 9:02 AM

## 2013-11-06 NOTE — Progress Notes (Addendum)
[place] [facility]  Imipenem day 8 Doxycycline day 9  9 days of vancomcyin Zosyn 2 full days   Subjective: Extubated but still confused.   Antibiotics:  Anti-infectives   Start     Dose/Rate Route Frequency Ordered Stop   11/04/13 1500  vancomycin (VANCOCIN) IVPB 750 mg/150 ml premix  Status:  Discontinued     750 mg 150 mL/hr over 60 Minutes Intravenous Every 24 hours 11/04/13 1207 11/05/13 1017   11/01/13 1200  vancomycin (VANCOCIN) IVPB 1000 mg/200 mL premix  Status:  Discontinued     1,000 mg 200 mL/hr over 60 Minutes Intravenous Every 24 hours 11/01/13 0845 11/04/13 1206   11/01/13 1128  vancomycin (VANCOCIN) 1,500 mg in sodium chloride 0.9 % 500 mL IVPB  Status:  Discontinued     1,500 mg 250 mL/hr over 120 Minutes Intravenous Every 24 hours 10/31/13 1138 11/01/13 0845   10/31/13 1000  vancomycin (VANCOCIN) 1,500 mg in sodium chloride 0.9 % 500 mL IVPB  Status:  Discontinued     1,500 mg 250 mL/hr over 120 Minutes Intravenous Every 48 hours 10/30/13 0802 10/31/13 1138   10/30/13 1400  imipenem-cilastatin (PRIMAXIN) 250 mg in sodium chloride 0.9 % 100 mL IVPB     250 mg 200 mL/hr over 30 Minutes Intravenous Every 6 hours 10/30/13 1108     10/29/13 1100  levofloxacin (LEVAQUIN) IVPB 750 mg  Status:  Discontinued     750 mg 100 mL/hr over 90 Minutes Intravenous Every 48 hours 10/29/13 1000 10/29/13 1002   10/29/13 1100  doxycycline (VIBRAMYCIN) 100 mg in dextrose 5 % 250 mL IVPB     100 mg 125 mL/hr over 120 Minutes Intravenous Every 12 hours 10/29/13 1002     10/29/13 1000  vancomycin (VANCOCIN) IVPB 750 mg/150 ml premix  Status:  Discontinued     750 mg 150 mL/hr over 60 Minutes Intravenous Every 12 hours 10/22/2013 1851 10/30/13 0802   10/02/2013 2200  piperacillin-tazobactam (ZOSYN) IVPB 3.375 g  Status:  Discontinued     3.375 g 12.5 mL/hr over 240 Minutes Intravenous 3 times per day 10/29/2013 1851 10/30/13 1031   10/08/2013 1900  vancomycin  (VANCOCIN) IVPB 1000 mg/200 mL premix     1,000 mg 200 mL/hr over 60 Minutes Intravenous NOW 10/19/2013 1851 10/24/2013 2120   10/10/2013 1400  vancomycin (VANCOCIN) IVPB 1000 mg/200 mL premix     1,000 mg 200 mL/hr over 60 Minutes Intravenous  Once 10/21/2013 1346 10/13/2013 1626   10/25/2013 1345  piperacillin-tazobactam (ZOSYN) IVPB 3.375 g     3.375 g 100 mL/hr over 30 Minutes Intravenous  Once 10/12/2013 1346 10/18/2013 1501      Medications: Scheduled Meds: . antiseptic oral rinse  15 mL Mouth Rinse QID  . chlorhexidine  15 mL Mouth Rinse BID  . doxycycline (VIBRAMYCIN) IV  100 mg Intravenous Q12H  . hydrocortisone sod succinate (SOLU-CORTEF) inj  50 mg Intravenous Q12H  . imipenem-cilastatin  250 mg Intravenous Q6H  . levETIRAcetam  500 mg Intravenous Q12H  . research study medication  0.25-2 mL Intravenous Q12H  . pantoprazole (PROTONIX) IV  40 mg Intravenous QHS   Continuous Infusions: . sodium chloride 10 mL/hr at 11/04/13 1015  . heparin 10,000 units/ 20 mL infusion syringe 1,250 Units/hr (11/06/13 1400)  . norepinephrine (LEVOPHED) Adult infusion Stopped (11/04/13 1300)  . dialysis replacement fluid (prismasate) 500 mL/hr at 11/05/13 1829  . dialysis replacement fluid (prismasate) 400 mL/hr at 11/06/13 1058  .  dialysate (PRISMASATE) 1,500 mL/hr at 11/06/13 0906   PRN Meds:.fentaNYL, heparin, heparin, sodium chloride    Objective: Weight change: -4 lb 13.6 oz (-2.2 kg)  Intake/Output Summary (Last 24 hours) at 11/06/13 1458 Last data filed at 11/06/13 1400  Gross per 24 hour  Intake   1337 ml  Output   4134 ml  Net  -2797 ml   Blood pressure 111/63, pulse 73, temperature 94 F (34.4 C), temperature source Oral, resp. rate 24, height $RemoveBe'5\' 8"'wWoOmfqRm$  (1.727 m), weight 245 lb 13 oz (111.5 kg), SpO2 98.00%. Temp:  [94 F (34.4 C)-97.6 F (36.4 C)] 94 F (34.4 C) (07/08 1200) Pulse Rate:  [68-86] 73 (07/08 1400) Resp:  [14-24] 24 (07/08 1400) BP: (59-139)/(45-85) 111/63 mmHg (07/08  1400) SpO2:  [94 %-100 %] 98 % (07/08 1400) Weight:  [245 lb 13 oz (111.5 kg)] 245 lb 13 oz (111.5 kg) (07/08 0500)  Physical Exam: General: Sedated but not answer able to answer many questions , getting CVVHD HEENT: , EOMI CVS tachycardic normal r,  no murmur rubs or gallops Chest:  rhonchi Abdomen: soft slightly distended not clear if she is tendernormal bowel sounds, Extremities: no  clubbing or edema noted bilaterally Skin: did not get good look at rash today Neuro: nonfocal Rectal tube with liquid stool in bag CBC:  Recent Labs Lab 10/31/13 0400 11/01/13 0500 11/02/13 0400 11/03/13 0445 11/04/13 0442 11/05/13 0503 11/06/13 0430  HGB 11.5* 10.8* 10.0* 8.7* 7.5* 7.4* 8.1*  HCT 32.2* 31.6* 29.2* 25.6* 22.3* 22.5* 24.6*  PLT 42* 45* 56* 66* 64* 74* 91*  INR 3.22* 1.45  --   --   --   --   --   APTT >200* 37  --   --   --   --   --      BMET  Recent Labs  11/05/13 1600 11/06/13 0430  NA 136* 138  K 4.6 4.4  CL 99 101  CO2 25 23  GLUCOSE 100* 99  BUN 33* 33*  CREATININE 1.36* 1.37*  CALCIUM 6.9* 7.0*     Liver Panel   Recent Labs  11/05/13 1600 11/06/13 0430  ALBUMIN 2.0* 2.2*       Sedimentation Rate No results found for this basename: ESRSEDRATE,  in the last 72 hours C-Reactive Protein No results found for this basename: CRP,  in the last 72 hours  Micro Results: Recent Results (from the past 240 hour(s))  CULTURE, BLOOD (ROUTINE X 2)     Status: None   Collection Time    10/12/2013 12:00 PM      Result Value Ref Range Status   Specimen Description BLOOD RIGHT ANTECUBITAL   Final   Special Requests     Final   Value: BOTTLES DRAWN AEROBIC AND ANAEROBIC AEB=12CC ANA=10CC   Culture  Setup Time     Final   Value: 10/30/2013 14:13     Performed at [facility]   Culture     Final   Value: NO GROWTH 5 DAYS     Performed at [facility]   Report Status 11/05/2013 FINAL   Final  CULTURE, BLOOD (ROUTINE X 2)     Status: None     Collection Time    10/06/2013 12:25 PM      Result Value Ref Range Status   Specimen Description BLOOD LEFT HAND   Final   Special Requests BOTTLES DRAWN AEROBIC ONLY [facility]   Final   Culture  Setup  Time     Final   Value: 10/30/2013 14:14     Performed at [facility]   Culture     Final   Value: NO GROWTH 5 DAYS     Performed at [facility]   Report Status 11/05/2013 FINAL   Final  URINE CULTURE     Status: None   Collection Time    10/10/2013 12:36 PM      Result Value Ref Range Status   Specimen Description URINE, CATHETERIZED   Final   Special Requests NONE   Final   Culture  Setup Time     Final   Value: 10/03/2013 23:20     Performed at [place]     Final   Value: NO GROWTH     Performed at [facility]   Culture     Final   Value: NO GROWTH     Performed at [facility]   Report Status 10/29/2013 FINAL   Final  URINE CULTURE     Status: None   Collection Time    10/10/2013  6:19 PM      Result Value Ref Range Status   Specimen Description URINE, CATHETERIZED   Final   Special Requests Normal   Final   Culture  Setup Time     Final   Value: 10/09/2013 19:10     Performed at [place]     Final   Value: NO GROWTH     Performed at [facility]   Culture     Final   Value: NO GROWTH     Performed at [facility]   Report Status 10/29/2013 FINAL   Final  MRSA PCR SCREENING     Status: None   Collection Time    10/10/2013  6:19 PM      Result Value Ref Range Status   MRSA by PCR NEGATIVE  NEGATIVE Final   Comment:            The GeneXpert MRSA Assay (FDA     approved for NASAL specimens     only), is one component of a     comprehensive MRSA colonization     surveillance program. It is not     intended to diagnose MRSA     infection nor to guide or     monitor treatment for     MRSA infections.  CULTURE, BLOOD (ROUTINE X 2)     Status: None   Collection Time     10/27/2013  7:55 PM      Result Value Ref Range Status   Specimen Description BLOOD LEFT HAND   Final   Special Requests BOTTLES DRAWN AEROBIC ONLY 1CC   Final   Culture  Setup Time     Final   Value: 10/15/2013 23:16     Performed at [facility]   Culture     Final   Value: NO GROWTH 5 DAYS     Performed at [facility]   Report Status 11/03/2013 FINAL   Final  RESPIRATORY VIRUS PANEL     Status: Abnormal   Collection Time    10/23/2013  8:01 PM      Result Value Ref Range Status   Source - [facility]AN NASAL SWAB   Corrected   Comment: CORRECTED ON 07/01 AT 1356: PREVIOUSLY REPORTED AS NASAL SWAB   Respiratory Syncytial Virus  A NOT DETECTED   Final   Respiratory Syncytial Virus B NOT DETECTED   Final   Influenza A NOT DETECTED   Final   Influenza B NOT DETECTED   Final   Parainfluenza 1 NOT DETECTED   Final   Parainfluenza 2 NOT DETECTED   Final   Parainfluenza 3 NOT DETECTED   Final   Metapneumovirus NOT DETECTED   Final   Rhinovirus DETECTED (*)  Final   Adenovirus NOT DETECTED   Final   Influenza A H1 NOT DETECTED   Final   Influenza A H3 NOT DETECTED   Final   Comment: (NOTE)           Normal Reference Range for each Analyte: NOT DETECTED     Testing performed using the Luminex xTAG Respiratory Viral Panel test     kit.     This test was developed and its performance characteristics determined     by [facility]. It has not been cleared or approved by the [place]     Food and Drug Administration. This test is used for clinical purposes.     It should not be regarded as investigational or for research. This     laboratory is certified under the Clinical Laboratory Improvement     Amendments of 1988 (CLIA) as qualified to perform high complexity     clinical laboratory testing.     Performed at [facility]  CULTURE, BLOOD (ROUTINE X 2)     Status: None   Collection Time    10/17/2013  8:13 PM      Result Value Ref Range Status   Specimen Description  BLOOD LEFT FOREARM   Final   Special Requests BOTTLES DRAWN AEROBIC ONLY 3CC   Final   Culture  Setup Time     Final   Value: 10/16/2013 23:16     Performed at [facility]   Culture     Final   Value: NO GROWTH 5 DAYS     Performed at [facility]   Report Status 11/03/2013 FINAL   Final  CLOSTRIDIUM DIFFICILE BY PCR     Status: None   Collection Time    11/05/13 10:15 AM      Result Value Ref Range Status   C difficile by pcr NEGATIVE  NEGATIVE Final    Studies/Results: Dg Chest Port 1 View  11/06/2013   CLINICAL DATA:  Respiratory distress.  EXAM: PORTABLE CHEST - 1 VIEW  COMPARISON:  11/05/2013.  FINDINGS: Right IJ line and left IJ dual-lumen catheter in stable position. Left lower lobe consolidation noted consistent atelectasis and/or pneumonia. Small left pleural effusion. Mild basilar atelectasis on the right. Cardiomegaly, no pulmonary venous congestion. No pneumothorax. No acute osseus abnormality.  IMPRESSION: 1. Right IJ line and left IJ dual lumen catheter in stable position. 2. Left lower lung consolidation consistent atelectasis and/or pneumonia.   Electronically Signed   By: Tracie [name]  Register   On: 11/06/2013 07:51   Dg Chest Port 1 View  11/05/2013   CLINICAL DATA:  Respiratory failure  EXAM: PORTABLE CHEST - 1 VIEW  COMPARISON:  Portable chest x-ray of November 04, 2013  FINDINGS: The lungs are better inflated today. The interstitial markings remain coarse. There is no alveolar pneumonia. The cardiac silhouette is top-normal in size but better defined today. The pulmonary vascularity is less engorged.  The right internal jugular catheter tip lies in the proximal SVC. The dual-lumen left internal jugular  dialysis type catheter tip lies in the midportion of the SVC. There has been interval removal of the endotracheal tube and esophagogastric tube.  IMPRESSION: There has been interval improvement in the appearance of the pulmonary interstitium consistent with resolving  interstitial edema.   Electronically Signed   By: Tracie  [name]   On: 11/05/2013 07:14      Assessment/Plan:  Principal Problem:   Sepsis Active Problems:   Acute respiratory failure   Acute renal failure   Elevated LFTs   Acidosis   Thrombocytopenia   Seizures   Septic shock    Tracie [name] is a 68 y.o. female  presents with sepsis and Acute respiratory distress requiring intubation in the setting of pulmonary symptoms reports having history of tick bites in the last 7-10 days. Lab reveal thrombocytopenia, mildly elevated transaminitis, physical exam has lacy rash  worse on back. She has been treated with vancomycin, zosyn-->imipenem and doxycyline. Acute titers for RMSF, Ehrlichia. Borrelia were negative. CT abdomen and pelvis with oral contrast negative for infection.  She is doing MUCH, MUCH better with less pressor support   #1 Septic shock: with no clear cut cause  --will dc her doxy after she has had 10 days  --with the zosyn she has had 10 days of anti-pseudomonal coverage, so will give her 4 more days of imipenem and then stop them --recheck RMSF and Ehrlichia abs > 2 weeks from her first titers  --Hopefully w as she is now extubated he will continue to improve and we can interview her more and try to ascertain if she has any symptoms that would guide [place] to any potential  unnoticed site of infection that we could pursue with imaging.  #2 loose stool with a rectal tube: CDiff PCR negative  I will sign off for now  Please call with further questions    LOS: 9 days   Alcide Evener 11/06/2013, 2:58 PM

## 2013-11-06 NOTE — Progress Notes (Signed)
Patient ID: Tracie [name], female   DOB: 05/21/1945, 68 y.o.   MRN: [id number]   [facility] Progress Note    Assessment/ Plan:   1. AKI in setting of SIRS: remains anuric and without evident renal recovery. Continue CVVH for now and plan for transition to IHD in the next 24hrs if blood pressures remain stable. UF goal tolerated at 18100mL/hr. Will change heparin delivery on CRRT to promote extracorporeal anti-coagulation  2. SIRS- etiology remains unclear-suspected bacterial etiology probably associated with tick bite (serologies so far negative)-empirically on doxy, imipenem, and vanco. ID note/plan reviewed-clostridium difficile PCR checked and found to be negative 3. VDRF-extubated and remains stable on function via nonrebreather mask 4. Thrombocytopenia: baseline platelet count not known-suspected to be associated with sepsis versus tick bite. Count gradually improving (fortunately not affected by low-dose heparin). 5. Abnormal LFT's- ?shock liver vs. tick borne illness 6. Hyperglycemia/DM- per PCCM  Subjective:   No acute events overnight, continues to clot CRRT filter every 8-10 hours even while in heparin systemically.    Objective:   BP 107/70  Pulse 76  Temp(Src) 97.4 F (36.3 C) (Oral)  Resp 23  Ht 5\' 8"  (1.727 m)  Wt 111.5 kg (245 lb 13 oz)  BMI 37.38 kg/m2  SpO2 100%  Intake/Output Summary (Last 24 hours) at 11/06/13 0732 Last data filed at 11/06/13 0700  Gross per 24 hour  Intake   1354 ml  Output   4211 ml  Net  -2857 ml   Weight change: -2.2 kg (-4 lb 13.6 oz)  Physical Exam: Gen: Somnolent, appears comfortable on oxygen via NRB. Mumbles. CVS: Pulse regular in rate and rhythm, S1 and S2 normal Resp: Coarse breath sounds bilaterally-no distinct rales or rhonchi Abd: Soft, obese, nontender and bowel sounds normal Ext: 2-3+ edema  Imaging: Dg Chest Port 1 View  11/05/2013   CLINICAL DATA:  Respiratory failure  EXAM: PORTABLE CHEST - 1 VIEW   COMPARISON:  Portable chest x-ray of November 04, 2013  FINDINGS: The lungs are better inflated today. The interstitial markings remain coarse. There is no alveolar pneumonia. The cardiac silhouette is top-normal in size but better defined today. The pulmonary vascularity is less engorged.  The right internal jugular catheter tip lies in the proximal SVC. The dual-lumen left internal jugular dialysis type catheter tip lies in the midportion of the SVC. There has been interval removal of the endotracheal tube and esophagogastric tube.  IMPRESSION: There has been interval improvement in the appearance of the pulmonary interstitium consistent with resolving interstitial edema.   Electronically Signed   By: David  Swazil[place]   On: 11/05/2013 07:14    Labs: BMET  Recent Labs Lab 11/03/13 0445 11/03/13 1600 11/04/13 0442 11/04/13 1534 11/05/13 0503 11/05/13 1600 11/06/13 0430  NA 133* 133* 135* 137 139 136* 138  K 4.3 4.3 4.2 4.5 4.4 4.6 4.4  CL 97 97 100 102 101 99 101  CO2 24 23 23 22 24 25 23   GLUCOSE 182* 187* 170* 134* 110* 100* 99  BUN 19 22 28* 38* 35* 33* 33*  CREATININE 1.48* 1.39* 1.37* 1.65* 1.48* 1.36* 1.37*  CALCIUM 7.5* 7.2* 6.8* 6.9* 6.9* 6.9* 7.0*  PHOS 2.9 2.7 3.0 3.6 3.9 4.0 4.0   CBC  Recent Labs Lab 10/31/13 0400 11/01/13 0500  11/03/13 0445 11/04/13 0442 11/05/13 0503 11/06/13 0430  WBC 11.5* 10.4  < > 11.5* 8.2 4.8 4.3  NEUTROABS 7.8* 6.3  --   --   --   --   --  HGB 11.5* 10.8*  < > 8.7* 7.5* 7.4* 8.1*  HCT 32.2* 31.6*  < > 25.6* 22.3* 22.5* 24.6*  MCV 84.7 84.5  < > 85.3 87.8 91.8 92.5  PLT 42* 45*  < > 66* 64* 74* 91*  < > = values in this interval not displayed.  Medications:    . antiseptic oral rinse  15 mL Mouth Rinse QID  . chlorhexidine  15 mL Mouth Rinse BID  . doxycycline (VIBRAMYCIN) IV  100 mg Intravenous Q12H  . hydrocortisone sod succinate (SOLU-CORTEF) inj  50 mg Intravenous Q12H  . imipenem-cilastatin  250 mg Intravenous Q6H  . levETIRAcetam   500 mg Intravenous Q12H  . research study medication  0.25-2 mL Intravenous Q12H  . pantoprazole (PROTONIX) IV  40 mg Intravenous QHS   Zetta BillsJay Shakyra Mattera, MD 11/06/2013, 7:32 AM

## 2013-11-07 ENCOUNTER — Inpatient Hospital Stay (HOSPITAL_COMMUNITY): Payer: Medicare HMO

## 2013-11-07 LAB — RENAL FUNCTION PANEL
ALBUMIN: 2.4 g/dL — AB (ref 3.5–5.2)
ANION GAP: 13 (ref 5–15)
ANION GAP: 13 (ref 5–15)
Albumin: 2.4 g/dL — ABNORMAL LOW (ref 3.5–5.2)
BUN: 27 mg/dL — ABNORMAL HIGH (ref 6–23)
BUN: 27 mg/dL — ABNORMAL HIGH (ref 6–23)
CHLORIDE: 102 meq/L (ref 96–112)
CO2: 24 meq/L (ref 19–32)
CO2: 24 mEq/L (ref 19–32)
CREATININE: 1.21 mg/dL — AB (ref 0.50–1.10)
CREATININE: 1.18 mg/dL — AB (ref 0.50–1.10)
Calcium: 7.4 mg/dL — ABNORMAL LOW (ref 8.4–10.5)
Calcium: 7.7 mg/dL — ABNORMAL LOW (ref 8.4–10.5)
Chloride: 102 mEq/L (ref 96–112)
GFR calc Af Amer: 54 mL/min — ABNORMAL LOW (ref >90)
GFR calc non Af Amer: 47 mL/min — ABNORMAL LOW (ref >90)
GFR, EST AFRICAN AMERICAN: 53 mL/min — AB (ref >90)
GFR, EST NON AFRICAN AMERICAN: 46 mL/min — AB (ref >90)
GLUCOSE: 142 mg/dL — AB (ref 70–99)
Glucose, Bld: 94 mg/dL (ref 70–99)
POTASSIUM: 4.5 meq/L (ref 3.7–5.3)
POTASSIUM: 4.8 meq/L (ref 3.7–5.3)
Phosphorus: 3.3 mg/dL (ref 2.3–4.6)
Phosphorus: 3.8 mg/dL (ref 2.3–4.6)
SODIUM: 139 meq/L (ref 137–147)
Sodium: 139 mEq/L (ref 137–147)

## 2013-11-07 LAB — OVA AND PARASITE EXAMINATION

## 2013-11-07 LAB — CBC
HCT: 25 % — ABNORMAL LOW (ref 36.0–46.0)
Hemoglobin: 8.1 g/dL — ABNORMAL LOW (ref 12.0–15.0)
MCH: 30.1 pg (ref 26.0–34.0)
MCHC: 32.4 g/dL (ref 30.0–36.0)
MCV: 92.9 fL (ref 78.0–100.0)
PLATELETS: 120 10*3/uL — AB (ref 150–400)
RBC: 2.69 MIL/uL — ABNORMAL LOW (ref 3.87–5.11)
RDW: 19.1 % — AB (ref 11.5–15.5)
WBC: 4.7 10*3/uL (ref 4.0–10.5)

## 2013-11-07 LAB — POCT ACTIVATED CLOTTING TIME
ACTIVATED CLOTTING TIME: 186 s
ACTIVATED CLOTTING TIME: 275 s
ACTIVATED CLOTTING TIME: 202 s
Activated Clotting Time: 146 seconds
Activated Clotting Time: 185 seconds
Activated Clotting Time: 186 seconds
Activated Clotting Time: 197 seconds
Activated Clotting Time: 202 seconds
Activated Clotting Time: 208 seconds
Activated Clotting Time: 152 seconds
Activated Clotting Time: 208 seconds
Activated Clotting Time: 196 seconds
Activated Clotting Time: 185 seconds
Activated Clotting Time: 202 seconds
Activated Clotting Time: 208 seconds
Activated Clotting Time: 191 seconds

## 2013-11-07 LAB — GLUCOSE, CAPILLARY
GLUCOSE-CAPILLARY: 114 mg/dL — AB (ref 70–99)
GLUCOSE-CAPILLARY: 98 mg/dL (ref 70–99)
GLUCOSE-CAPILLARY: 99 mg/dL (ref 70–99)
Glucose-Capillary: 125 mg/dL — ABNORMAL HIGH (ref 70–99)

## 2013-11-07 LAB — APTT: APTT: 119 s — AB (ref 24–37)

## 2013-11-07 LAB — HEPATIC FUNCTION PANEL
ALBUMIN: 2.4 g/dL — AB (ref 3.5–5.2)
ALK PHOS: 162 U/L — AB (ref 39–117)
ALT: 251 U/L — ABNORMAL HIGH (ref 0–35)
AST: 317 U/L — ABNORMAL HIGH (ref 0–37)
BILIRUBIN INDIRECT: 0.8 mg/dL (ref 0.3–0.9)
Bilirubin, Direct: 1.5 mg/dL — ABNORMAL HIGH (ref 0.0–0.3)
TOTAL PROTEIN: 6 g/dL (ref 6.0–8.3)
Total Bilirubin: 2.3 mg/dL — ABNORMAL HIGH (ref 0.3–1.2)

## 2013-11-07 LAB — MAGNESIUM: MAGNESIUM: 2.7 mg/dL — AB (ref 1.5–2.5)

## 2013-11-07 MED ORDER — SODIUM CHLORIDE 0.9 % IV BOLUS (SEPSIS)
500.0000 mL | Freq: Once | INTRAVENOUS | Status: AC
Start: 2013-11-07 — End: 2013-11-07
  Administered 2013-11-07: 500 mL via INTRAVENOUS

## 2013-11-07 MED ORDER — VITAL AF 1.2 CAL PO LIQD
1000.0000 mL | ORAL | Status: DC
Start: 2013-11-07 — End: 2013-11-08
  Administered 2013-11-07: 1000 mL
  Filled 2013-11-07 (×3): qty 1000

## 2013-11-07 MED ORDER — HYDROCORTISONE NA SUCCINATE PF 100 MG IJ SOLR
25.0000 mg | Freq: Three times a day (TID) | INTRAMUSCULAR | Status: DC
  Administered 2013-11-07 – 2013-11-08 (×3): 25 mg via INTRAVENOUS
  Filled 2013-11-07 (×6): qty 0.5

## 2013-11-07 MED ORDER — PRO-STAT SUGAR FREE PO LIQD
30.0000 mL | Freq: Two times a day (BID) | ORAL | Status: DC
  Administered 2013-11-07 (×2): 30 mL
  Filled 2013-11-07 (×4): qty 30

## 2013-11-07 MED ORDER — MIDAZOLAM HCL 2 MG/2ML IJ SOLN
INTRAMUSCULAR | Status: AC
  Filled 2013-11-07: qty 4

## 2013-11-07 MED ORDER — VITAL HIGH PROTEIN PO LIQD
1000.0000 mL | ORAL | Status: DC
  Filled 2013-11-07 (×2): qty 1000

## 2013-11-07 MED ORDER — ETOMIDATE 2 MG/ML IV SOLN
INTRAVENOUS | Status: AC
  Filled 2013-11-07: qty 10

## 2013-11-07 MED ORDER — FENTANYL CITRATE 0.05 MG/ML IJ SOLN
INTRAMUSCULAR | Status: AC
  Filled 2013-11-07: qty 4

## 2013-11-07 MED ORDER — LAMOTRIGINE 100 MG PO TABS
100.0000 mg | ORAL_TABLET | Freq: Two times a day (BID) | ORAL | Status: DC
  Administered 2013-11-07 (×2): 100 mg
  Filled 2013-11-07 (×4): qty 1

## 2013-11-07 NOTE — Progress Notes (Signed)
PULMONARY / CRITICAL CARE MEDICINE   Name: Tracie [name] MRN: [id number] DOB: [birth year]    ADMISSION DATE:  10/04/2013  PRIMARY SERVICE: PCCM  CHIEF COMPLAINT:  SOB  BRIEF PATIENT DESCRIPTION: 68 y.o. F transferred from [facility] with septic shock, unclear source. Concern for tick borne illness, MODS.  SIGNIFICANT EVENTS / STUDIES:  6/28 presented to ED with SOB, treated w rocephin for UTI and sent home with levaquin for bronchitis. 6/29 adm to ICU/PCCM via ED in respiratory distress, hypotensive, lactic acidosis, fever to 104.   6/30 renal US: Negative for hydronephrosis.  Hepatic steatosis 6/30 Echocardiogram: Limited study quality but there appears to be normal biventricular size and systolic function. Impaired relaxation with normal filling pressures  6/30 BLE venous Dopplers: no DVT 7/01 Worsening MODS, shock, acidosis. CRRT initiated. Failed converntional vasopressors. Epinephrine gtt started 7/01 CTAP: negative  7/01 CT chest: mild left base atx 7/02 Reduced reqt for vasopressors. Off epinephrine 7/03 Remains on levo, vaso, arouses / follows commands  7/06 Extubated, remained on minimal levophed, goal negative fluid balance 7/07 Somnolent, off vasopressors. Tolerating extubation. Tolerating neg 100 cc/hr 7/09 Remains somnolent with tenuous resp status. Marginal cough. Requiring intermittent NTS. On VM. Remains very weak diffusely. Tolerating volume removal on CRRT. NGT ordered to initiate TFs  LINES / TUBES: Foley 6/29 >> 7/6 ETT 6/29 >> 7/6 Femoral arterial cath 6/29 >> 7/6 R IJ CVL 6/29 >>  L IJ HD cath 7/01 >>   CULTURES: Blood 6/29 >>> negative Urine 6/29 >>> negative RVP 6/29  >>>rhinovirus positive  Tick-borne pathogens >> negative C diff 7/07 >> NEG  ANTIBIOTICS: Zosyn 6/29 >> 7/1 Vancomycin 6/29 >> 7/07 Imipenem 7/1 >> 7/13 (planned) Doxy 6/29 >> 7/09  SUBJECTIVE:  Remains somnolent with tenuous resp status. Marginal cough. Requiring intermittent NTS. On VM.  Remains very weak diffusely. Tolerating volume removal  VITAL SIGNS: Temp:  [97 F (36.1 C)-98.3 F (36.8 C)] 97 F (36.1 C) (07/09 1157) Pulse Rate:  [69-91] 69 (07/09 1400) Resp:  [17-32] 27 (07/09 1400) BP: (66-158)/(46-126) 96/53 mmHg (07/09 1400) SpO2:  [85 %-100 %] 97 % (07/09 1400) Weight:  [106 kg (233 lb 11 oz)] 106 kg (233 lb 11 oz) (07/09 0600)  HEMODYNAMICS:    VENTILATOR SETTINGS:    INTAKE / OUTPUT: Intake/Output     07/08 0701 - 07/09 0700 07/09 0701 - 07/10 0700   I.V. (mL/kg) 149 (1.4) 43 (0.4)   IV Piggyback 1100 450   Total Intake(mL/kg) 1249 (11.8) 493 (4.7)   Urine (mL/kg/hr) 428 (0.2) 75 (0.1)   Other 4055 (1.6) 1267 (1.7)   Stool 100 (0)    Total Output 4583 1342   Net -3334 -849         PHYSICAL EXAMINATION: General: NAD, RASS -1, intermittently F/C Neuro: no focal deficits noted, diffusely weak HEENT: WNL Cardiovascular: RRR, no M.  Lungs: clear anteriorly Abdomen: BS low, soft, NT/ND Ext: improving BUE>BLE pitting edema, NSC multiple UE and LE gangrenous digits   LABS: I have reviewed all new lab results and relevant findings are noted in A/P.  CXR: NNF   ASSESSMENT / PLAN:  PULMONARY A: Acute Respiratory Failure Respiratory alkalosis, resolved H/O OSA with noncompliance with CPAP P:   Cont supp O2 Cont to monitor in ICU Cont airway hygiene inc PRN NTS  CARDIOVASCULAR A:  Septic Shock, resolved.  Suspected adrenal Insufficiency  P:  Wean stress dose steroids as tolerated  Monitor BP and rhythm  RENAL A:  Oligo-anuric AKI, CRRT initiated 7/01 AG metabolic acidosis, controlled Hypocalcemia, improving Volume overload, tolerating volume removal @ 100 cc/hr P:   Cont CRRT per Renal Cont volume removal as permitted by BP Monitor BMET intermittently Monitor I/Os Correct electrolytes as indicated  GASTROINTESTINAL A:   Elevated LFTs Dysphagia due to AMS P:   SUP: N/I post extubation Place NGT for  TFs  HEMATOLOGIC A:  DIC, resolved Thrombocytopenia, improving P:  VTE Proph: SCDs Monitor CBC intermittently Transfuse per usual ICU guidelines  INFECTIOUS A:   Septic Shock, resolved Suspected tick borne illness UTI, resolved RVP + for rhinovirus - unclear significance LLL infiltrate, possible PNA P:   Micro and abx as above ID managing abx  ENDOCRINE A:   Hyperglycemia, controlled Risk of hyoglycemia (AKI) P:   Cont CBGs Resume SSI for glu > 180   NEUROLOGIC A:   Hx of Seizures (previously on Lamictal) Acute encephalopathy Hypoactive delirium (on MIND [place] trial) P:   Minimize all sedatives Monitor for seizures  DERMATOLOGY A: Rash - resolved  Petechiae, resolved P: Monitor  GLOBAL: -Full Code  Family updated in detail @ bedside  Billy Fischeravid Simonds, MD;  PCCM service; Mobile [phone]

## 2013-11-07 NOTE — Progress Notes (Signed)
NUTRITION FOLLOW UP / CONSULT  Intervention:   Utilize 18M PEPuP Protocol: initiate TF via small bore enteral feeding tube with Vital AF 1.2 at 25 ml/h with Prostat 30 ml BID on day 1; on day 2, increase to goal rate of 55 ml/h (1320 ml per day) to provide 1784 kcals, 129 gm protein, 1071 ml free water daily.  Nutrition Dx:   Inadequate oral intake related to inability to eat as evidenced by NPO status. Ongoing.  New Goal:   Intake to meet >90% of estimated nutrition needs.  Monitor:   TF tolerance/adequacy, respiratory status, ability to advance diet, labs, weight trend.  Assessment:   68 y.o. F transferred from [facility] on 6/29 with septic shock, unclear source, concern for tick-borne illness, ruled out.  Discussed patient in ICU rounds today. Patient remains off vent. Requiring oxygen via simple mask. Still receiving CRRT to regulate volume status. Unable to complete SLP evaluation/treatment due to respiratory status. TF off since extubation. Plans to place small bore feeding tube and resume TF today. Received MD Consult for TF initiation and management.  Height: Ht Readings from Last 1 Encounters:  10/18/2013 5\' 8"  (1.727 m)    Weight Status:  Up with positive fluid status Wt Readings from Last 1 Encounters:  11/07/13 233 lb 11 oz (106 kg)  10/29/13  223 lb 1.7 oz (101.2 kg)   Re-estimated needs:  Kcal: 1700-1900 Protein: 125-140 gm Fluid: 1.8 L  Skin: no issues  Diet Order: NPO   Intake/Output Summary (Last 24 hours) at 11/07/13 1108 Last data filed at 11/07/13 1001  Gross per 24 hour  Intake   1008 ml  Output   4370 ml  Net  -3362 ml    Last BM: 7/9   Labs:   Recent Labs Lab 11/05/13 0503  11/06/13 0430 11/06/13 1600 11/07/13 0430  NA 139  < > 138 138 139  K 4.4  < > 4.4 4.4 4.5  CL 101  < > 101 100 102  CO2 24  < > 23 24 24   BUN 35*  < > 33* 30* 27*  CREATININE 1.48*  < > 1.37* 1.28* 1.21*  CALCIUM 6.9*  < > 7.0* 7.2* 7.4*  MG 2.4  --  2.5  --  2.7*   PHOS 3.9  < > 4.0 3.5 3.3  GLUCOSE 110*  < > 99 103* 94  < > = values in this interval not displayed.  CBG (last 3)   Recent Labs  11/06/13 1524 11/06/13 2342 11/07/13 0609  GLUCAP 99 99 125*    Scheduled Meds: . antiseptic oral rinse  15 mL Mouth Rinse QID  . chlorhexidine  15 mL Mouth Rinse BID  . doxycycline (VIBRAMYCIN) IV  100 mg Intravenous Q12H  . feeding supplement (PRO-STAT SUGAR FREE 64)  30 mL Per Tube BID  . feeding supplement (VITAL HIGH PROTEIN)  1,000 mL Per Tube Q24H  . hydrocortisone sod succinate (SOLU-CORTEF) inj  25 mg Intravenous Q8H  . imipenem-cilastatin  250 mg Intravenous Q6H  . lamoTRIgine  100 mg Per Tube BID  . research study medication  0.25-2 mL Intravenous Q12H    Continuous Infusions: . sodium chloride 10 mL/hr at 11/04/13 1015  . heparin 10,000 units/ 20 mL infusion syringe 2,000 Units/hr (11/07/13 0800)  . dialysis replacement fluid (prismasate) 500 mL/hr at 11/07/13 0016  . dialysis replacement fluid (prismasate) 400 mL/hr at 11/07/13 0205  . dialysate (PRISMASATE) 1,500 mL/hr at 11/07/13 [phone]  Molli Barrows, RD, LDN, [place] Pager [phone] After Hours Pager [phone]

## 2013-11-07 NOTE — Progress Notes (Signed)
SLP Cancellation Note  Patient Details Name: Tracie JasperBrenda B Letts MRN: [id number] DOB: 02/09/1946   Cancelled treatment:       Reason Eval/Treat Not Completed: Medical issues which prohibited therapy. Pt on nonrebreather, RN reports she may get reintubated. Will continue to check chart.    Caera Enwright, Riley NearingBonnie Caroline 11/07/2013, 8:34 AM

## 2013-11-07 NOTE — Progress Notes (Signed)
UR Completed.  Denorris Reust Jane [phone] 11/07/2013  

## 2013-11-07 NOTE — Progress Notes (Signed)
eLink Physician-Brief Progress Note Patient Name: Tracie JasperBrenda B Adolph DOB: 07/02/1945 MRN: [id number]  Date of Service  11/07/2013   HPI/Events of Note   1. Hypotension despite renal now running cvvh even x 2h 2. reasses resp status - no paradoxical respn, she is drowsy without change  eICU Interventions  1. 500 cc fluid bolus 2. Continue to monitor resp status, no imminent indication to intubate   Intervention Category Intermediate Interventions: Hypotension - evaluation and management  Joanna Hall 11/07/2013, 11:10 PM

## 2013-11-07 NOTE — Progress Notes (Signed)
ANTIBIOTIC CONSULT NOTE - FOLLOW UP  Pharmacy Consult:  Primaxin Indication:  Sepsis  Allergies  Allergen Reactions  . Sulfa Antibiotics Rash    Oral blisters    Patient Measurements: Height: 5\' 8"  (172.7 cm) Weight: 233 lb 11 oz (106 kg) IBW/kg (Calculated) : 63.9  Vital Signs: Temp: 97.5 F (36.4 C) (07/09 0435) Temp src: Axillary (07/09 0435) BP: 105/91 mmHg (07/09 0700) Pulse Rate: 87 (07/09 0700) Intake/Output from previous day: 07/08 0701 - 07/09 0700 In: 1249 [I.V.:149; IV Piggyback:1100] Out: 4583 [Urine:428; Stool:100]  Labs:  Recent Labs  11/05/13 0503  11/06/13 0430 11/06/13 1600 11/07/13 0430  WBC 4.8  --  4.3  --  4.7  HGB 7.4*  --  8.1*  --  8.1*  PLT 74*  --  91*  --  120*  CREATININE 1.48*  < > 1.37* 1.28* 1.21*  < > = values in this interval not displayed. Estimated Creatinine Clearance: 58.3 ml/min (by C-G formula based on Cr of 1.21).  Recent Labs  11/04/13 1114  VANCOTROUGH 22.8*     Microbiology: Recent Results (from the past 720 hour(s))  URINE CULTURE     Status: None   Collection Time    10/27/13 12:36 PM      Result Value Ref Range Status   Specimen Description URINE, CLEAN CATCH   Final   Special Requests NONE   Final   Culture  Setup Time     Final   Value: 10/07/2013 00:06     Performed at 10/30/2013 Count     Final   Value: NO GROWTH     Performed at [facility]   Culture     Final   Value: NO GROWTH     Performed at [facility]   Report Status 10/29/2013 FINAL   Final  CULTURE, BLOOD (ROUTINE X 2)     Status: None   Collection Time    10/13/2013 12:00 PM      Result Value Ref Range Status   Specimen Description BLOOD RIGHT ANTECUBITAL   Final   Special Requests     Final   Value: BOTTLES DRAWN AEROBIC AND ANAEROBIC AEB=12CC ANA=10CC   Culture  Setup Time     Final   Value: 10/30/2013 14:13     Performed at 12/31/2013   Culture     Final   Value: NO GROWTH 5 DAYS   Performed at [facility]   Report Status 11/05/2013 FINAL   Final  CULTURE, BLOOD (ROUTINE X 2)     Status: None   Collection Time    10/24/2013 12:25 PM      Result Value Ref Range Status   Specimen Description BLOOD LEFT HAND   Final   Special Requests BOTTLES DRAWN AEROBIC ONLY [facility]   Final   Culture  Setup Time     Final   Value: 10/30/2013 14:14     Performed at 12/31/2013   Culture     Final   Value: NO GROWTH 5 DAYS     Performed at [facility]   Report Status 11/05/2013 FINAL   Final  URINE CULTURE     Status: None   Collection Time    10/27/2013 12:36 PM      Result Value Ref Range Status   Specimen Description URINE, CATHETERIZED   Final   Special Requests NONE   Final   Culture  Setup  Time     Final   Value: 10/13/2013 23:20     Performed at [place]     Final   Value: NO GROWTH     Performed at [facility]   Culture     Final   Value: NO GROWTH     Performed at [facility]   Report Status 10/29/2013 FINAL   Final  URINE CULTURE     Status: None   Collection Time    10/03/2013  6:19 PM      Result Value Ref Range Status   Specimen Description URINE, CATHETERIZED   Final   Special Requests Normal   Final   Culture  Setup Time     Final   Value: 10/15/2013 19:10     Performed at [place]     Final   Value: NO GROWTH     Performed at [facility]   Culture     Final   Value: NO GROWTH     Performed at [facility]   Report Status 10/29/2013 FINAL   Final  MRSA PCR SCREENING     Status: None   Collection Time    10/01/2013  6:19 PM      Result Value Ref Range Status   MRSA by PCR NEGATIVE  NEGATIVE Final   Comment:            The GeneXpert MRSA Assay (FDA     approved for NASAL specimens     only), is one component of a     comprehensive MRSA colonization     surveillance program. It is not     intended to diagnose MRSA     infection nor to guide  or     monitor treatment for     MRSA infections.  CULTURE, BLOOD (ROUTINE X 2)     Status: None   Collection Time    10/16/2013  7:55 PM      Result Value Ref Range Status   Specimen Description BLOOD LEFT HAND   Final   Special Requests BOTTLES DRAWN AEROBIC ONLY 1CC   Final   Culture  Setup Time     Final   Value: 10/15/2013 23:16     Performed at [facility]   Culture     Final   Value: NO GROWTH 5 DAYS     Performed at [facility]   Report Status 11/03/2013 FINAL   Final  RESPIRATORY VIRUS PANEL     Status: Abnormal   Collection Time    10/22/2013  8:01 PM      Result Value Ref Range Status   Source - RVPAN NASAL SWAB   Corrected   Comment: CORRECTED ON 07/01 AT 1356: PREVIOUSLY REPORTED AS NASAL SWAB   Respiratory Syncytial Virus A NOT DETECTED   Final   Respiratory Syncytial Virus B NOT DETECTED   Final   Influenza A NOT DETECTED   Final   Influenza B NOT DETECTED   Final   Parainfluenza 1 NOT DETECTED   Final   Parainfluenza 2 NOT DETECTED   Final   Parainfluenza 3 NOT DETECTED   Final   Metapneumovirus NOT DETECTED   Final   Rhinovirus DETECTED (*)  Final   Adenovirus NOT DETECTED   Final   Influenza A H1 NOT DETECTED   Final   Influenza A H3 NOT DETECTED  Final   Comment: (NOTE)           Normal Reference Range for each Analyte: NOT DETECTED     Testing performed using the Luminex xTAG Respiratory Viral Panel test     kit.     This test was developed and its performance characteristics determined     by [facility]. It has not been cleared or approved by the [place]     Food and Drug Administration. This test is used for clinical purposes.     It should not be regarded as investigational or for research. This     laboratory is certified under the [place] (CLIA) as qualified to perform high complexity     clinical laboratory testing.     Performed at [place], BLOOD (ROUTINE X  2)     Status: None   Collection Time    10/22/2013  8:13 PM      Result Value Ref Range Status   Specimen Description BLOOD LEFT FOREARM   Final   Special Requests BOTTLES DRAWN AEROBIC ONLY 3CC   Final   Culture  Setup Time     Final   Value: 10/26/2013 23:16     Performed at [facility]   Culture     Final   Value: NO GROWTH 5 DAYS     Performed at [facility]   Report Status 11/03/2013 FINAL   Final  CLOSTRIDIUM DIFFICILE BY PCR     Status: None   Collection Time    11/05/13 10:15 AM      Result Value Ref Range Status   C difficile by pcr NEGATIVE  NEGATIVE Final      Assessment: 68 year old female on Primaxin for sepsis and doxycycline for tick bite prior to admission.  She remains on CRRT with minimal interruptions; no plan to transition to intermittent HD yet.  Vanc 6/29 >> 7/7 Zosyn 6/29 >> 7/1 Doxy 6/30 >> 7/9 Imipenem 7/1 >> (stop 7/12)  7/6 VT = 22.8 mcg/mL on 1gm q24 (CRRT)  6/29 bldx2 - neg 6/29 urine - neg 6/29 viral - Rhinovirus+ 7/7 C.diff - negative   Goal of Therapy:  Clearance of infection   Plan:  - Continue Primaxin $RemoveBeforeDEI'250mg'aTiAxyolJCmQkVTe$  IV Q6H, stop date 11/10/13 - Doxy $Remo'100mg'SRdlO$  IV Q12H through today per MD - Monitor CRRT plans and tolerance, clinical progress    Carlus Stay D. Mina Marble, PharmD, BCPS Pager:  [phone] 11/07/2013, 7:44 AM

## 2013-11-07 NOTE — Progress Notes (Signed)
Patient ID: Tracie [name], female   DOB: 07-24-1945, 68 y.o.   MRN: [id number]  [facility] Progress Note    Assessment/ Plan:   1. AKI in setting of SIRS: Some urine output noted overnight (428 cc) that may be indicative of impending renal recovery. At this time, given its low hemodynamic impact, I will continue CVVH for providing clearance and regulation of her volume status. Currently she is tolerating a UF goal of146mL/hr. No significant improvement of her mental status 2. SIRS- etiology remains unclear-suspected bacterial etiology probably associated with tick bite (serologies so far negative)-empirically on doxy, imipenem, and vanco.  3. VDRF-extubated and remains stable on function via nonrebreather mask 4. Thrombocytopenia: baseline platelet count not known-suspected to be associated with sepsis versus tick bite. Count gradually improving (fortunately not affected by low-dose heparin). 5. Abnormal LFT's- ?shock liver vs. tick borne illness 6. Hyperglycemia/DM- per PCCM   Subjective:   No acute events overnight, CVVH filter lasting nearly 24 hours on extracorporeal heparin    Objective:   BP 105/91  Pulse 87  Temp(Src) 97.5 F (36.4 C) (Axillary)  Resp 32  Ht 5\' 8"  (1.727 m)  Wt 106 kg (233 lb 11 oz)  BMI 35.54 kg/m2  SpO2 85%  Intake/Output Summary (Last 24 hours) at 11/07/13 4098 Last data filed at 11/07/13 0700  Gross per 24 hour  Intake   1249 ml  Output   4583 ml  Net  -3334 ml   Weight change: -5.5 kg (-12 lb 2 oz)  Physical Exam: Gen: Comfortable resting in bed-oxygen via nonrebreather mask CVS: Pulse regular in rate and rhythm, S1 and S2 normal Resp: Coarse/transmitted breath sounds bilaterally Abd: Soft, obese, nontender Ext: 1-2+ lower extremity edema, ischemic fingertips/toes  Imaging: Dg Chest Port 1 View  11/06/2013   CLINICAL DATA:  Respiratory distress.  EXAM: PORTABLE CHEST - 1 VIEW  COMPARISON:  11/05/2013.  FINDINGS: Right IJ line  and left IJ dual-lumen catheter in stable position. Left lower lobe consolidation noted consistent atelectasis and/or pneumonia. Small left pleural effusion. Mild basilar atelectasis on the right. Cardiomegaly, no pulmonary venous congestion. No pneumothorax. No acute osseus abnormality.  IMPRESSION: 1. Right IJ line and left IJ dual lumen catheter in stable position. 2. Left lower lung consolidation consistent atelectasis and/or pneumonia.   Electronically Signed   ByMaisie Fus  Register   On: 11/06/2013 07:51    Labs: BMET  Recent Labs Lab 11/04/13 0442 11/04/13 1534 11/05/13 0503 11/05/13 1600 11/06/13 0430 11/06/13 1600 11/07/13 0430  NA 135* 137 139 136* 138 138 139  K 4.2 4.5 4.4 4.6 4.4 4.4 4.5  CL 100 102 101 99 101 100 102  CO2 23 22 24 25 23 24 24   GLUCOSE 170* 134* 110* 100* 99 103* 94  BUN 28* 38* 35* 33* 33* 30* 27*  CREATININE 1.37* 1.65* 1.48* 1.36* 1.37* 1.28* 1.21*  CALCIUM 6.8* 6.9* 6.9* 6.9* 7.0* 7.2* 7.4*  PHOS 3.0 3.6 3.9 4.0 4.0 3.5 3.3   CBC  Recent Labs Lab 11/01/13 0500  11/04/13 0442 11/05/13 0503 11/06/13 0430 11/07/13 0430  WBC 10.4  < > 8.2 4.8 4.3 4.7  NEUTROABS 6.3  --   --   --   --   --   HGB 10.8*  < > 7.5* 7.4* 8.1* 8.1*  HCT 31.6*  < > 22.3* 22.5* 24.6* 25.0*  MCV 84.5  < > 87.8 91.8 92.5 92.9  PLT 45*  < > 64* 74* 91* 120*  < > =  values in this interval not displayed.  Medications:    . antiseptic oral rinse  15 mL Mouth Rinse QID  . chlorhexidine  15 mL Mouth Rinse BID  . doxycycline (VIBRAMYCIN) IV  100 mg Intravenous Q12H  . etomidate      . fentaNYL      . hydrocortisone sod succinate (SOLU-CORTEF) inj  50 mg Intravenous Q12H  . imipenem-cilastatin  250 mg Intravenous Q6H  . levETIRAcetam  500 mg Intravenous Q12H  . midazolam      . research study medication  0.25-2 mL Intravenous Q12H  . pantoprazole (PROTONIX) IV  40 mg Intravenous QHS   Zetta BillsJay Murrell Dome, MD 11/07/2013, 7:23 AM

## 2013-11-08 ENCOUNTER — Inpatient Hospital Stay (HOSPITAL_COMMUNITY): Payer: Medicare HMO

## 2013-11-08 DIAGNOSIS — I469 Cardiac arrest, cause unspecified: Secondary | ICD-10-CM

## 2013-11-08 LAB — RENAL FUNCTION PANEL
ALBUMIN: 2.1 g/dL — AB (ref 3.5–5.2)
Anion gap: 21 — ABNORMAL HIGH (ref 5–15)
BUN: 28 mg/dL — ABNORMAL HIGH (ref 6–23)
CHLORIDE: 99 meq/L (ref 96–112)
CO2: 18 meq/L — AB (ref 19–32)
CREATININE: 1.14 mg/dL — AB (ref 0.50–1.10)
Calcium: 7.9 mg/dL — ABNORMAL LOW (ref 8.4–10.5)
GFR calc Af Amer: 57 mL/min — ABNORMAL LOW (ref >90)
GFR calc non Af Amer: 49 mL/min — ABNORMAL LOW (ref >90)
GLUCOSE: 161 mg/dL — AB (ref 70–99)
Phosphorus: 4.4 mg/dL (ref 2.3–4.6)
Potassium: 5.3 mEq/L (ref 3.7–5.3)
Sodium: 138 mEq/L (ref 137–147)

## 2013-11-08 LAB — APTT

## 2013-11-08 LAB — CBC
HCT: 19.3 % — ABNORMAL LOW (ref 36.0–46.0)
HEMOGLOBIN: 6.1 g/dL — AB (ref 12.0–15.0)
MCH: 30.2 pg (ref 26.0–34.0)
MCHC: 31.6 g/dL (ref 30.0–36.0)
MCV: 95.5 fL (ref 78.0–100.0)
PLATELETS: 161 10*3/uL (ref 150–400)
RBC: 2.02 MIL/uL — ABNORMAL LOW (ref 3.87–5.11)
RDW: 20.8 % — ABNORMAL HIGH (ref 11.5–15.5)
WBC: 11.5 10*3/uL — AB (ref 4.0–10.5)

## 2013-11-08 LAB — MAGNESIUM: MAGNESIUM: 2.7 mg/dL — AB (ref 1.5–2.5)

## 2013-11-08 LAB — MISCELLANEOUS TEST

## 2013-11-08 LAB — GLUCOSE, CAPILLARY: GLUCOSE-CAPILLARY: 133 mg/dL — AB (ref 70–99)

## 2013-11-08 LAB — POCT ACTIVATED CLOTTING TIME: ACTIVATED CLOTTING TIME: 208 s

## 2013-11-08 MED ORDER — PHENYLEPHRINE HCL 10 MG/ML IJ SOLN
30.0000 ug/min | INTRAVENOUS | Status: DC
  Administered 2013-11-08: 75 ug/min via INTRAVENOUS
  Filled 2013-11-08 (×3): qty 4

## 2013-11-08 MED ORDER — DEXTROSE 5 % IV SOLN
2.0000 ug/min | INTRAVENOUS | Status: DC
  Filled 2013-11-08: qty 16

## 2013-11-08 MED ORDER — PHENYLEPHRINE HCL 10 MG/ML IJ SOLN
30.0000 ug/min | INTRAMUSCULAR | Status: DC
  Filled 2013-11-08: qty 1

## 2013-11-08 MED ORDER — FENTANYL CITRATE 0.05 MG/ML IJ SOLN
INTRAMUSCULAR | Status: AC
  Filled 2013-11-08: qty 2

## 2013-11-08 MED ORDER — DEXTROSE 5 % IV SOLN
2.0000 ug/min | INTRAVENOUS | Status: DC
  Administered 2013-11-08: 40 ug/min via INTRAVENOUS
  Filled 2013-11-08: qty 4

## 2013-11-08 MED ORDER — MIDAZOLAM HCL 2 MG/2ML IJ SOLN
INTRAMUSCULAR | Status: AC
  Filled 2013-11-08: qty 2

## 2013-11-08 MED ORDER — SODIUM BICARBONATE 8.4 % IV SOLN
INTRAVENOUS | Status: AC
  Filled 2013-11-08: qty 200

## 2013-11-08 MED ORDER — ATROPINE SULFATE 0.1 MG/ML IJ SOLN
INTRAMUSCULAR | Status: AC
  Administered 2013-11-08: 05:00:00
  Filled 2013-11-08: qty 10

## 2013-11-08 MED FILL — Medication: Qty: 1 | Status: AC

## 2013-11-27 NOTE — Discharge Summary (Signed)
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  Tracie [name] is a 68 y.o. F with PMH as below. She presented to [facility] ED 2022-11-28 in respiratory distress. In ED, workup demonstrated findings suggestive of septic shock with unclear etiology. Lactate 5 - She was started on BiPAP and was subsequently transferred to [facility] ICU for further evaluation and management. Of note, pt went to ED one day prior (6/28) for SOB. She was found to have UTI, treated with Rocephin. She was offered admission for bronchitis which she refused. She was sent home with Levaquin. Additionally, chart review shows that pt was recently given Bactrim for abx. She also has some lesions in her mouth, unknown how long they have been present.   HOSPITAL COURSE:   SIGNIFICANT EVENTS / STUDIES:  6/28 presented to ED with SOB, treated w rocephin for UTI and sent home with levaquin for bronchitis.  28-Nov-2022 adm to ICU/PCCM via ED in respiratory distress, hypotensive, lactic acidosis, fever to 104. Intubated. CVL and arterial catheters placed. Vasopressors initiated. Resuscitated per EGDT protocol. Broad spectrum abx (Vanc,  Zosyn) initiated 6/30 ID consult: Concern for tick borne illness. RMSF, Ehrlichia, Lyme titers ordered. Doxycycline initiated 6/30 abd [place]: Negative for hydronephrosis. Hepatic steatosis  6/30 Echocardiogram: Limited study quality but there appears to be normal biventricular size and systolic function. Impaired relaxation with normal filling pressures  6/30 BLE venous Dopplers: no DVT  7/01 Worsening MODS, shock, acidosis. Renal consutation obtained. CRRT initiated. Failed conventional vasopressors. Epinephrine gtt started  7/01 CTAP: negative  7/01 CT chest: mild left base atx  7/02 Reduced reqt for vasopressors. Off epinephrine  7/03 Remained on NE and VP. Arousable / followed commands  7/06 Extubated, remained on minimal levophed, CRRT continued: goal negative fluid balance  7/07 Somnolent, off vasopressors. Tolerating extubation. Tolerating neg 100 cc/hr  7/09 Somnolent. Marginal cough. Requiring intermittent NTS. On VM. Remains very weak diffusely. Tolerating volume removal on CRRT. NGT ordered to initiate TFs 7/09 PM - 7/10 AM worsening hypotension requiring increasing doses of vasopressors 7/10 AM Hgb 6.1 > one unit PRBCs ordered  The following was documented 7/10 early AM by the on call PCCM MD: Called by eMD, patient has been deteriorating throughout the night from a respiratory as well as a hemodynamic standpoint. Patient was seen and examined, records reviewed, severe septic shock with all associated symptoms. While reviewing records, RN reported patient in respiratory arrest. Patient was promptly intubated and ETT verified in the right place by auscultation and color change. While bagging patient, it was noted that cardiac rhythm changed. Patient lost pulse, please see code note. After completion of code the patient was declared dead, there was a daughter there that was notified and condolences were given.   LINES / TUBES:  Foley 2022/11/28 >>  7/6  ETT 6/29 >> 7/6  Femoral arterial cath  6/29 >> 7/6  R IJ CVL 6/29 L IJ HD cath 7/01 ETT 7/10 R fem art catheter 7/10  CULTURES:  Blood 6/29 >> negative  Urine 6/29 >> negative  RVP 6/29 >> rhinovirus positive  Ehrlichia titers 6/30 >> NEG RMSF titers 6/30 >> NEG Lyme titers 6/30 >> NEG  HUMAN EHRLICHIOSIS DNA 7/04 >> POSITIVE (result available 11/10/13) C diff 7/07 >> NEG     ANTIBIOTICS:  Zosyn 6/29 >> 7/1  Vancomycin 6/29 >> 7/07  Imipenem 7/1 >> 7/13 (planned)  Doxy 6/29 >> 7/09   Cause of death:  Severe sepsis/septic shock with MODS due to Ehrlichiosis   Tracie Fischeravid Simonds, MD;  PCCM service; Mobile [phone]

## 2013-11-30 NOTE — Procedures (Signed)
Arterial Catheter Insertion Procedure Note Particia JasperBrenda B Nolton [id number] 07/25/1945  Procedure: Insertion of Arterial Catheter  Indications: Blood pressure monitoring  Procedure Details Consent: Unable to obtain consent because of emergent medical necessity. Time Out: Verified patient identification, verified procedure, site/side was marked, verified correct patient position, special equipment/implants available, medications/allergies/relevent history reviewed, required imaging and test results available.  Performed  Maximum sterile technique was used including antiseptics, cap, gloves, gown, hand hygiene, mask and sheet. Skin prep: Chlorhexidine; local anesthetic administered 20 gauge catheter was inserted into right femoral artery using the Seldinger technique.  Evaluation Blood flow good; BP tracing good. Complications: No apparent complications.  U/S used in placement.  YACOUB,WESAM 11/18/2013

## 2013-11-30 NOTE — Progress Notes (Signed)
Husband, Molly MaduroBobby Formoso, called and informed that patient deteriorated throughout the last part of the shift and that hospital staff has proceeded to perform CPR. Husband states he will be arriving at the hospital in "a little while" and to continue CPR.

## 2013-11-30 NOTE — Procedures (Signed)
Cardiopulmonary Resuscitation  See earlier progress note, patient noted in PEA, ACLS protocol was started, shocked x5 out of VF, given mag x1 when was concerned for torsades, please see code sheet for details and order of which drugs were given.  Code continued for 25 minutes, patient remained persistently in PEA after which decision to stop was made and family was notified.  Alyson ReedyWesam G. Yacoub, M.D. [facility]. Pager: [phone]. After hours pager: [phone].

## 2013-11-30 NOTE — Progress Notes (Signed)
10/31/2013 RN at bedside. Pt began showing signs of distress: breathing and heart rate slowed. CCM MD notified. Pt status further decompensated. Pupils dilated and sluggish. Maxed out on levo and neo. Pt emergently intubated. Then pt went pea. See code blue sheet. Crrt stopped, blood returned. Unable to regain sustainable heart rhythm. Time of death [id number] called by MD Molli KnockYacoub. Family notified, at bedside, support given. Chaplain called.

## 2013-11-30 NOTE — Progress Notes (Signed)
eLink Physician-Brief Progress Note Patient Name: Tracie [name] DOB: 11/10/1945 MRN: [id number]  Date of Service  11/10/2013   HPI/Events of Note    Recent Labs Lab 11/04/13 0442 11/05/13 0503 11/06/13 0430 11/07/13 0430 11/06/2013 0350  HGB 7.5* 7.4* 8.1* 8.1* 6.1*   No overt bleedng except transient oral bleed per RN  eICU Interventions  1 unit prbc   Intervention Category Intermediate Interventions: Diagnostic test evaluation  Tracie [name] 11/14/2013, 4:47 AM

## 2013-11-30 NOTE — Procedures (Signed)
Intubation Procedure Note Tracie [name] [id number] 06/15/1945  Procedure: Intubation Indications: Respiratory insufficiency  Procedure Details Consent: Unable to obtain consent because of emergent medical necessity. Time Out: Verified patient identification, verified procedure, site/side was marked, verified correct patient position, special equipment/implants available, medications/allergies/relevent history reviewed, required imaging and test results available.  Performed  Maximum sterile technique was used including antiseptics, gloves, hand hygiene and mask.  MAC    Evaluation Hemodynamic Status: BP stable throughout; O2 sats: stable throughout but was on pressors. Patient's Current Condition: unstable Complications: No apparent complications Patient did tolerate procedure well. Chest X-ray ordered to verify placement.  CXR: pending.   YACOUB,WESAM 11/17/2013

## 2013-11-30 NOTE — Progress Notes (Signed)
eLink Physician-Brief Progress Note Patient Name: Tracie JasperBrenda B [name] DOB: 03/05/1946 MRN: [id number]  Date of Service  01/09/2014   HPI/Events of Note   Still hyptotensive  eICU Interventions  Start neo   Intervention Category Intermediate Interventions: Hypotension - evaluation and management  Ishmel Acevedo 01/09/2014, 12:19 AM

## 2013-11-30 NOTE — Progress Notes (Signed)
Responded to Code blue page at approximately 5 AM.  On arrival, Dr. Molli KnockYacoub, CCM Attending running code.  No further assistance needed.  Shirlee LatchAngela Susana Gripp, MD PGY-1, [facility]

## 2013-11-30 NOTE — Progress Notes (Signed)
Called by eMD, patient has been deteriorating throughout the night from a respiratory as well as a hemodynamic standpoint.  Patient was seen and examined, records reviewed, severe septic shock with all associated symptoms.  While reviewing records, RN reported patient in respiratory arrest.  Patient was promptly intubated and ETT verified in the right place by auscultation and color change.  While bagging patient, it was noted that cardiac rhythm changed.  Patient lost pulse, please see code note.  After completion of code the patient was declared dead, there was a daughter there that was notified and condolences were given.  CC time 35 min.  Alyson ReedyWesam G. Pharoah Goggins, M.D. [facility]. Pager: [phone]. After hours pager: [phone].

## 2013-11-30 NOTE — Progress Notes (Signed)
eLink Physician-Brief Progress Note Patient Name: Tracie JasperBrenda B Aird DOB: 02/25/1946 MRN: [id number]  Date of Service  01/25/14   HPI/Events of Note   refractory hypotension despite neo  eICU Interventions  Start levopehed   Intervention Category Major Interventions: Shock - evaluation and management  Hendrix Console 01/25/14, 3:36 AM

## 2013-11-30 NOTE — Progress Notes (Signed)
Chaplain Note: Responded to page requesting support for expired patient's family. Located patient's daughter in waiting area. Provided grief care and accompanied her to view patient's body. Stayed with her until other family members arrived. Acted as Print production plannerliaison between family and staff. Provided spiritual and emotional support and prayed with family. Rutherford NailLeah Smith, Chaplain

## 2013-11-30 DEATH — deceased

## 2016-03-02 IMAGING — CR DG CHEST 1V PORT
1 series · 1 of 1 positions shown · non-contrast
Comparison: 11/05/2013.

CLINICAL DATA: Respiratory distress.

EXAM:
PORTABLE CHEST - 1 VIEW

[AP]
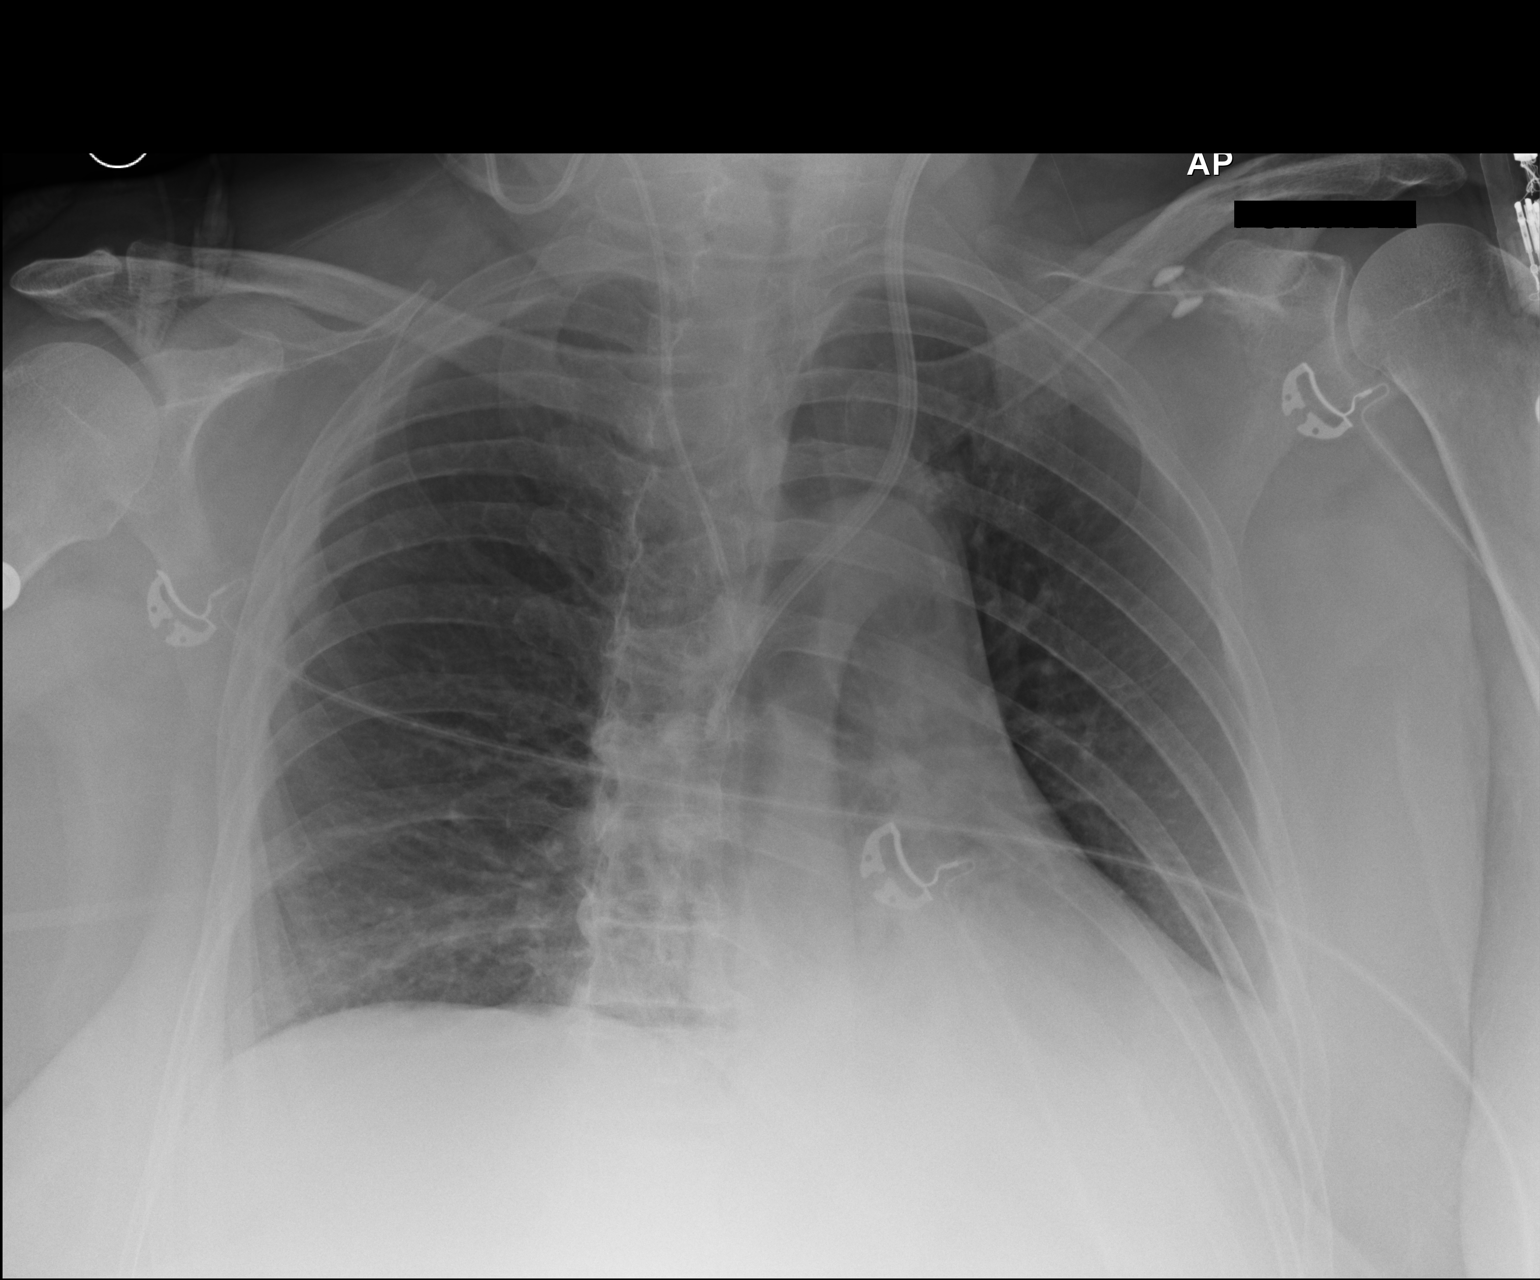

[1 of 1 positions shown; findings below may reference images not displayed]

FINDINGS: Right IJ line and left IJ dual-lumen catheter in stable position.
Left lower lobe consolidation noted consistent atelectasis and/or
pneumonia. Small left pleural effusion. Mild basilar atelectasis on
the right. Cardiomegaly, no pulmonary venous congestion. No
pneumothorax. No acute osseus abnormality.
IMPRESSION: 1. Right IJ line and left IJ dual lumen catheter in stable position.
2. Left lower lung consolidation consistent atelectasis and/or
pneumonia.
